# Patient Record
Sex: Female | Born: 1970
Health system: Southern US, Community
[De-identification: ages and names within clinical notes are randomized; demographics above are authoritative.]

## PROBLEM LIST (undated history)

## (undated) DIAGNOSIS — I10 Essential (primary) hypertension: Secondary | ICD-10-CM

## (undated) DIAGNOSIS — F32A Depression, unspecified: Secondary | ICD-10-CM

## (undated) DIAGNOSIS — K5909 Other constipation: Secondary | ICD-10-CM

## (undated) DIAGNOSIS — K649 Unspecified hemorrhoids: Secondary | ICD-10-CM

## (undated) DIAGNOSIS — D219 Benign neoplasm of connective and other soft tissue, unspecified: Secondary | ICD-10-CM

## (undated) DIAGNOSIS — D509 Iron deficiency anemia, unspecified: Secondary | ICD-10-CM

## (undated) DIAGNOSIS — E782 Mixed hyperlipidemia: Secondary | ICD-10-CM

## (undated) DIAGNOSIS — E669 Obesity, unspecified: Secondary | ICD-10-CM

## (undated) DIAGNOSIS — D259 Leiomyoma of uterus, unspecified: Secondary | ICD-10-CM

## (undated) DIAGNOSIS — R7303 Prediabetes: Secondary | ICD-10-CM

## (undated) DIAGNOSIS — M1990 Unspecified osteoarthritis, unspecified site: Secondary | ICD-10-CM

## (undated) DIAGNOSIS — D649 Anemia, unspecified: Secondary | ICD-10-CM

## (undated) HISTORY — PX: TUBAL LIGATION: SHX77

## (undated) HISTORY — DX: Depression, unspecified: F32.A

## (undated) HISTORY — DX: Anemia, unspecified: D64.9

## (undated) HISTORY — DX: Essential (primary) hypertension: I10

## (undated) HISTORY — DX: Benign neoplasm of connective and other soft tissue, unspecified: D21.9

## (undated) HISTORY — DX: Prediabetes: R73.03

---

## 1997-10-20 HISTORY — PX: TUBAL LIGATION: SHX77

## 1998-03-27 ENCOUNTER — Other Ambulatory Visit: Admission: RE | Admit: 1998-03-27 | Discharge: 1998-03-27 | Payer: Self-pay | Admitting: *Deleted

## 1998-03-30 ENCOUNTER — Ambulatory Visit (HOSPITAL_COMMUNITY): Admission: RE | Admit: 1998-03-30 | Discharge: 1998-03-30 | Payer: Self-pay | Admitting: *Deleted

## 1998-06-21 ENCOUNTER — Ambulatory Visit (HOSPITAL_COMMUNITY): Admission: RE | Admit: 1998-06-21 | Discharge: 1998-06-21 | Payer: Self-pay | Admitting: *Deleted

## 1998-08-23 ENCOUNTER — Inpatient Hospital Stay (HOSPITAL_COMMUNITY): Admission: AD | Admit: 1998-08-23 | Discharge: 1998-08-26 | Payer: Self-pay | Admitting: *Deleted

## 1999-03-23 ENCOUNTER — Emergency Department (HOSPITAL_COMMUNITY): Admission: EM | Admit: 1999-03-23 | Discharge: 1999-03-23 | Payer: Self-pay | Admitting: Emergency Medicine

## 1999-04-19 ENCOUNTER — Emergency Department (HOSPITAL_COMMUNITY): Admission: EM | Admit: 1999-04-19 | Discharge: 1999-04-19 | Payer: Self-pay | Admitting: Emergency Medicine

## 1999-11-20 ENCOUNTER — Encounter: Payer: Self-pay | Admitting: Emergency Medicine

## 1999-11-20 ENCOUNTER — Emergency Department (HOSPITAL_COMMUNITY): Admission: EM | Admit: 1999-11-20 | Discharge: 1999-11-21 | Payer: Self-pay | Admitting: Emergency Medicine

## 2000-04-12 ENCOUNTER — Emergency Department (HOSPITAL_COMMUNITY): Admission: EM | Admit: 2000-04-12 | Discharge: 2000-04-12 | Payer: Self-pay | Admitting: Emergency Medicine

## 2000-06-05 ENCOUNTER — Encounter: Payer: Self-pay | Admitting: Cardiology

## 2000-06-05 ENCOUNTER — Ambulatory Visit (HOSPITAL_COMMUNITY): Admission: RE | Admit: 2000-06-05 | Discharge: 2000-06-05 | Payer: Self-pay | Admitting: Cardiology

## 2000-09-26 ENCOUNTER — Emergency Department (HOSPITAL_COMMUNITY): Admission: EM | Admit: 2000-09-26 | Discharge: 2000-09-26 | Payer: Self-pay | Admitting: Internal Medicine

## 2000-12-07 ENCOUNTER — Emergency Department (HOSPITAL_COMMUNITY): Admission: EM | Admit: 2000-12-07 | Discharge: 2000-12-07 | Payer: Self-pay | Admitting: Emergency Medicine

## 2000-12-10 ENCOUNTER — Emergency Department (HOSPITAL_COMMUNITY): Admission: EM | Admit: 2000-12-10 | Discharge: 2000-12-10 | Payer: Self-pay | Admitting: Emergency Medicine

## 2001-01-19 ENCOUNTER — Encounter: Admission: RE | Admit: 2001-01-19 | Discharge: 2001-04-19 | Payer: Self-pay | Admitting: Internal Medicine

## 2001-03-10 ENCOUNTER — Other Ambulatory Visit: Admission: RE | Admit: 2001-03-10 | Discharge: 2001-03-10 | Payer: Self-pay | Admitting: Internal Medicine

## 2001-08-24 ENCOUNTER — Emergency Department (HOSPITAL_COMMUNITY): Admission: EM | Admit: 2001-08-24 | Discharge: 2001-08-24 | Payer: Self-pay | Admitting: Emergency Medicine

## 2001-08-24 ENCOUNTER — Encounter: Payer: Self-pay | Admitting: Emergency Medicine

## 2001-11-28 ENCOUNTER — Encounter: Payer: Self-pay | Admitting: Emergency Medicine

## 2001-11-28 ENCOUNTER — Emergency Department (HOSPITAL_COMMUNITY): Admission: EM | Admit: 2001-11-28 | Discharge: 2001-11-28 | Payer: Self-pay | Admitting: Emergency Medicine

## 2002-04-29 ENCOUNTER — Encounter: Payer: Self-pay | Admitting: Internal Medicine

## 2002-04-29 ENCOUNTER — Encounter: Admission: RE | Admit: 2002-04-29 | Discharge: 2002-04-29 | Payer: Self-pay | Admitting: Internal Medicine

## 2003-01-10 ENCOUNTER — Inpatient Hospital Stay (HOSPITAL_COMMUNITY): Admission: AD | Admit: 2003-01-10 | Discharge: 2003-01-10 | Payer: Self-pay | Admitting: *Deleted

## 2003-09-10 ENCOUNTER — Inpatient Hospital Stay (HOSPITAL_COMMUNITY): Admission: AD | Admit: 2003-09-10 | Discharge: 2003-09-10 | Payer: Self-pay | Admitting: Obstetrics and Gynecology

## 2003-10-05 ENCOUNTER — Encounter: Admission: RE | Admit: 2003-10-05 | Discharge: 2003-10-05 | Payer: Self-pay | Admitting: Obstetrics and Gynecology

## 2004-04-22 ENCOUNTER — Inpatient Hospital Stay (HOSPITAL_COMMUNITY): Admission: AD | Admit: 2004-04-22 | Discharge: 2004-04-22 | Payer: Self-pay | Admitting: Gynecology

## 2004-11-13 ENCOUNTER — Encounter: Admission: RE | Admit: 2004-11-13 | Discharge: 2004-11-13 | Payer: Self-pay | Admitting: Internal Medicine

## 2004-12-22 ENCOUNTER — Emergency Department (HOSPITAL_COMMUNITY): Admission: EM | Admit: 2004-12-22 | Discharge: 2004-12-22 | Payer: Self-pay | Admitting: Emergency Medicine

## 2005-09-21 ENCOUNTER — Emergency Department (HOSPITAL_COMMUNITY): Admission: EM | Admit: 2005-09-21 | Discharge: 2005-09-21 | Payer: Self-pay | Admitting: Emergency Medicine

## 2007-05-05 ENCOUNTER — Emergency Department (HOSPITAL_COMMUNITY): Admission: EM | Admit: 2007-05-05 | Discharge: 2007-05-05 | Payer: Self-pay | Admitting: Family Medicine

## 2008-04-18 ENCOUNTER — Inpatient Hospital Stay (HOSPITAL_COMMUNITY): Admission: AD | Admit: 2008-04-18 | Discharge: 2008-04-18 | Payer: Self-pay | Admitting: Obstetrics & Gynecology

## 2008-05-11 ENCOUNTER — Inpatient Hospital Stay (HOSPITAL_COMMUNITY): Admission: AD | Admit: 2008-05-11 | Discharge: 2008-05-11 | Payer: Self-pay | Admitting: Obstetrics & Gynecology

## 2008-05-25 ENCOUNTER — Emergency Department (HOSPITAL_COMMUNITY): Admission: EM | Admit: 2008-05-25 | Discharge: 2008-05-25 | Payer: Self-pay | Admitting: Emergency Medicine

## 2009-03-09 ENCOUNTER — Inpatient Hospital Stay (HOSPITAL_COMMUNITY): Admission: AD | Admit: 2009-03-09 | Discharge: 2009-03-10 | Payer: Self-pay | Admitting: Obstetrics and Gynecology

## 2009-03-09 ENCOUNTER — Ambulatory Visit: Payer: Self-pay | Admitting: Obstetrics and Gynecology

## 2009-07-18 ENCOUNTER — Emergency Department (HOSPITAL_COMMUNITY): Admission: EM | Admit: 2009-07-18 | Discharge: 2009-07-18 | Payer: Self-pay | Admitting: Family Medicine

## 2009-08-24 ENCOUNTER — Emergency Department (HOSPITAL_COMMUNITY): Admission: EM | Admit: 2009-08-24 | Discharge: 2009-08-24 | Payer: Self-pay | Admitting: Family Medicine

## 2009-12-11 ENCOUNTER — Encounter (INDEPENDENT_AMBULATORY_CARE_PROVIDER_SITE_OTHER): Payer: Self-pay | Admitting: Emergency Medicine

## 2009-12-11 ENCOUNTER — Inpatient Hospital Stay (HOSPITAL_COMMUNITY): Admission: EM | Admit: 2009-12-11 | Discharge: 2009-12-14 | Payer: Self-pay | Admitting: Emergency Medicine

## 2009-12-11 ENCOUNTER — Ambulatory Visit: Payer: Self-pay | Admitting: Vascular Surgery

## 2010-07-13 ENCOUNTER — Emergency Department (HOSPITAL_COMMUNITY): Admission: EM | Admit: 2010-07-13 | Discharge: 2010-07-13 | Payer: Self-pay | Admitting: Family Medicine

## 2011-01-02 LAB — POCT PREGNANCY, URINE: Preg Test, Ur: NEGATIVE

## 2011-01-02 LAB — POCT URINALYSIS DIPSTICK
Bilirubin Urine: NEGATIVE
Hgb urine dipstick: NEGATIVE
Nitrite: NEGATIVE
pH: 5.5 (ref 5.0–8.0)

## 2011-01-10 LAB — BASIC METABOLIC PANEL
BUN: 5 mg/dL — ABNORMAL LOW (ref 6–23)
CO2: 22 mEq/L (ref 19–32)
CO2: 25 mEq/L (ref 19–32)
Calcium: 8.9 mg/dL (ref 8.4–10.5)
Chloride: 105 mEq/L (ref 96–112)
Chloride: 105 mEq/L (ref 96–112)
Creatinine, Ser: 0.56 mg/dL (ref 0.4–1.2)
Creatinine, Ser: 0.63 mg/dL (ref 0.4–1.2)
GFR calc non Af Amer: >60 mL/min (ref >60)
GFR calc non Af Amer: >60 mL/min (ref >60)
Glucose, Bld: 110 mg/dL — ABNORMAL HIGH (ref 70–99)
Potassium: 3.6 mEq/L (ref 3.5–5.1)
Potassium: 3.6 mEq/L (ref 3.5–5.1)
Potassium: 3.7 mEq/L (ref 3.5–5.1)
Sodium: 134 mEq/L — ABNORMAL LOW (ref 135–145)

## 2011-01-10 LAB — CBC
Hemoglobin: 10.3 g/dL — ABNORMAL LOW (ref 12.0–15.0)
Hemoglobin: 11.8 g/dL — ABNORMAL LOW (ref 12.0–15.0)
MCHC: 31.3 g/dL (ref 30.0–36.0)
MCHC: 32.8 g/dL (ref 30.0–36.0)
MCV: 74.4 fL — ABNORMAL LOW (ref 78.0–100.0)
MCV: 75.4 fL — ABNORMAL LOW (ref 78.0–100.0)
MCV: 75.6 fL — ABNORMAL LOW (ref 78.0–100.0)
Platelets: 259 10*3/uL (ref 150–400)
Platelets: 351 10*3/uL (ref 150–400)
RBC: 4.14 MIL/uL (ref 3.87–5.11)
RBC: 4.94 MIL/uL (ref 3.87–5.11)
RDW: 18.6 % — ABNORMAL HIGH (ref 11.5–15.5)
RDW: 19.4 % — ABNORMAL HIGH (ref 11.5–15.5)
RDW: 19.6 % — ABNORMAL HIGH (ref 11.5–15.5)
WBC: 8.3 10*3/uL (ref 4.0–10.5)

## 2011-01-10 LAB — DIFFERENTIAL
Basophils Absolute: 0 10*3/uL (ref 0.0–0.1)
Basophils Absolute: 0.1 10*3/uL (ref 0.0–0.1)
Eosinophils Absolute: 0.2 10*3/uL (ref 0.0–0.7)
Eosinophils Relative: 3 % (ref 0–5)
Lymphocytes Relative: 12 % (ref 12–46)
Lymphocytes Relative: 23 % (ref 12–46)
Lymphs Abs: 1.3 10*3/uL (ref 0.7–4.0)
Lymphs Abs: 1.9 10*3/uL (ref 0.7–4.0)
Monocytes Absolute: 0.4 10*3/uL (ref 0.1–1.0)
Monocytes Absolute: 0.7 10*3/uL (ref 0.1–1.0)
Monocytes Absolute: 1.1 10*3/uL — ABNORMAL HIGH (ref 0.1–1.0)
Monocytes Relative: 5 % (ref 3–12)
Monocytes Relative: 6 % (ref 3–12)
Neutro Abs: 14.7 10*3/uL — ABNORMAL HIGH (ref 1.7–7.7)
Neutro Abs: 5.7 10*3/uL (ref 1.7–7.7)
Neutro Abs: 8.9 10*3/uL — ABNORMAL HIGH (ref 1.7–7.7)
Neutrophils Relative %: 78 % — ABNORMAL HIGH (ref 43–77)
Neutrophils Relative %: 83 % — ABNORMAL HIGH (ref 43–77)

## 2011-01-28 LAB — WET PREP, GENITAL: Trich, Wet Prep: NONE SEEN

## 2011-01-28 LAB — GC/CHLAMYDIA PROBE AMP, GENITAL: GC Probe Amp, Genital: NEGATIVE

## 2011-03-07 NOTE — Group Therapy Note (Signed)
NAME:  Latoya Juarez, IVINS NO.:  [id number]   MEDICAL RECORD NO.:  [id number]                   PATIENT TYPE:  OUT   LOCATION:  [facility]                           FACILITY:  [facility]   PHYSICIAN:  Argentina Donovan, MD                     DATE OF BIRTH:  1971/05/02   DATE OF SERVICE:  10/05/2003                                    CLINIC NOTE   REASON FOR VISIT:  The patient is a 40 year old black female who is a  gravida 5, para 5-0-0-5 with five C-sections who was seen in the [facility] because  of Bartholin abscess one week ago.  A Word catheter was put in and drained  and which fell out three days later, and the patient is in for a recheck.  In addition, she has not had a Pap smear in over a year and has no other  complaints.  We discussed the Bartholin cyst and the mechanism of action  with the patient and told her when it gets as large as a plum to come in and  we will put the catheter back in which we will expect her to leave in for  three weeks.   PHYSICAL EXAMINATION:  PELVIC:  External genitalia is normal exception of a  3 cm Bartholin cyst on the right side which is nontender.  Introitus is  normal.  BUS within normal limits.  Vagina is clean and well rugated.  The  cervix is clean and nulliparous.  The uterus anterior, normal size, shape,  consistency, and the adnexa could not be palpated because of the habitus of  the patient 230 pounds and 5 feet 1 inch.   IMPRESSION:  Right Bartholin cyst.  Pap smear was done.                                               Argentina Donovan, MD    PR/MEDQ  D:  10/05/2003  T:  10/06/2003  Job:  [id number]

## 2011-07-17 LAB — URINALYSIS, ROUTINE W REFLEX MICROSCOPIC
Glucose, UA: 100 — AB
Hgb urine dipstick: NEGATIVE
Ketones, ur: 15 — AB
Protein, ur: NEGATIVE
pH: 6

## 2011-07-17 LAB — GC/CHLAMYDIA PROBE AMP, GENITAL
Chlamydia, DNA Probe: NEGATIVE
GC Probe Amp, Genital: NEGATIVE

## 2011-07-17 LAB — WET PREP, GENITAL

## 2011-07-18 LAB — URINE MICROSCOPIC-ADD ON

## 2011-07-18 LAB — URINALYSIS, ROUTINE W REFLEX MICROSCOPIC
Leukocytes, UA: NEGATIVE
Specific Gravity, Urine: >1.03 — ABNORMAL HIGH
pH: 6

## 2011-07-18 LAB — POCT PREGNANCY, URINE
Operator id: 202651
Preg Test, Ur: NEGATIVE

## 2011-10-04 ENCOUNTER — Encounter: Payer: Self-pay | Admitting: Emergency Medicine

## 2011-10-04 ENCOUNTER — Emergency Department (HOSPITAL_COMMUNITY): Payer: Worker's Compensation

## 2011-10-04 ENCOUNTER — Emergency Department (HOSPITAL_COMMUNITY)
Admission: EM | Admit: 2011-10-04 | Discharge: 2011-10-04 | Disposition: A | Discharge status: Home / Self Care | Payer: Worker's Compensation | Attending: Emergency Medicine | Admitting: Emergency Medicine

## 2011-10-04 DIAGNOSIS — R404 Transient alteration of awareness: Secondary | ICD-10-CM | POA: Insufficient documentation

## 2011-10-04 DIAGNOSIS — W010XXA Fall on same level from slipping, tripping and stumbling without subsequent striking against object, initial encounter: Secondary | ICD-10-CM | POA: Insufficient documentation

## 2011-10-04 DIAGNOSIS — M545 Low back pain, unspecified: Secondary | ICD-10-CM | POA: Insufficient documentation

## 2011-10-04 DIAGNOSIS — G8929 Other chronic pain: Secondary | ICD-10-CM | POA: Insufficient documentation

## 2011-10-04 DIAGNOSIS — M25569 Pain in unspecified knee: Secondary | ICD-10-CM | POA: Insufficient documentation

## 2011-10-04 DIAGNOSIS — M549 Dorsalgia, unspecified: Secondary | ICD-10-CM

## 2011-10-04 DIAGNOSIS — W19XXXA Unspecified fall, initial encounter: Secondary | ICD-10-CM

## 2011-10-04 HISTORY — DX: Obesity, unspecified: E66.9

## 2011-10-04 MED ORDER — OXYCODONE-ACETAMINOPHEN 5-325 MG PO TABS: 2.0000 | ORAL_TABLET | ORAL | Status: AC | PRN

## 2011-10-04 MED ORDER — IBUPROFEN 800 MG PO TABS
800.0000 mg | ORAL_TABLET | Freq: Once | ORAL | Status: AC
  Administered 2011-10-04: 800 mg via ORAL
  Filled 2011-10-04: qty 1

## 2011-10-04 MED ORDER — OXYCODONE-ACETAMINOPHEN 5-325 MG PO TABS
1.0000 | ORAL_TABLET | Freq: Once | ORAL | Status: AC
  Administered 2011-10-04: 1 via ORAL
  Filled 2011-10-04: qty 1

## 2011-10-04 NOTE — ED Provider Notes (Signed)
History     CSN: [id number] Arrival date & time: 10/04/2011  8:52 PM   First MD Initiated Contact with Patient 10/04/11 2154      Chief Complaint  Patient presents with  . Fall    (Consider location/radiation/quality/duration/timing/severity/associated sxs/prior treatment) Patient is a 40 y.o. female presenting with fall. The history is provided by the patient. No language interpreter was used.  Fall The accident occurred 6 to 12 hours ago. The fall occurred while walking. She landed on a hard floor. There was no blood loss. The pain is at a severity of 5/10. The pain is mild. She was ambulatory at the scene. There was no entrapment after the fall. There was no drug use involved in the accident. There was no alcohol use involved in the accident. Associated symptoms include loss of consciousness. Pertinent negatives include no visual change, no fever, no numbness, no bowel incontinence, no vomiting, no headaches and no tingling. The symptoms are aggravated by activity, standing and pressure on the injury. She has tried ice for the symptoms. The treatment provided mild relief.   [facility] where she fell forward and slipped on the floor and landed on her right knee. She's having right knee pain and lower back pain. Past medical history of chronic lower back pain. No deformity or swelling noted. No numbness or tingling or shooting pain. Patient is in a wheelchair presently and states that she would like to have crutches when she leaves. Past Medical History  Diagnosis Date  . Obesity     Past Surgical History  Procedure Date  . Cesarean section     No family history on file.  History  Substance Use Topics  . Smoking status: Never Smoker   . Smokeless tobacco: Not on file  . Alcohol Use: No    OB History    Grav Para Term Preterm Abortions TAB SAB Ect Mult Living                  Review of Systems  Constitutional: Negative for fever.  Gastrointestinal: Negative  for vomiting and bowel incontinence.  Neurological: Positive for loss of consciousness. Negative for tingling, numbness and headaches.  All other systems reviewed and are negative.    Allergies  Review of patient's allergies indicates no known allergies.  Home Medications   Current Outpatient Rx  Name Route Sig Dispense Refill  . ACETAMINOPHEN 500 MG PO TABS Oral Take 1,000 mg by mouth every 6 (six) hours as needed. For pain     . CYCLOBENZAPRINE HCL 10 MG PO TABS Oral Take 10 mg by mouth 3 (three) times daily as needed. For muscle spasms     . PRESCRIPTION MEDICATION Oral Take 1 tablet by mouth daily as needed. For back pain       BP 120/84  Pulse 84  Temp(Src) 98.4 F (36.9 C) (Oral)  Resp 16  SpO2 99%  LMP 09/04/2011  Physical Exam  Constitutional: She is oriented to person, place, and time.  Eyes: Pupils are equal, round, and reactive to light.  Neck: Normal range of motion.  Pulmonary/Chest: Effort normal.  Musculoskeletal: She exhibits tenderness. She exhibits no edema.       R knee and lower back pain point tenderness in lumbar spine  Neurological: She is alert and oriented to person, place, and time.  Skin: Skin is warm and dry.  Psychiatric: She has a normal mood and affect.    ED Course  Procedures (including critical care  time)  Labs Reviewed - No data to display Dg Knee Complete 4 Views Right  10/04/2011  *RADIOLOGY REPORT*  Clinical Data: Fall, pain  RIGHT KNEE - COMPLETE 4+ VIEW  Comparison:  None.  Findings:  There is no evidence of fracture, dislocation, or joint effusion.  There is no evidence of arthropathy or other focal bone abnormality.  Soft tissues are unremarkable.  IMPRESSION: Negative.  Original Report Authenticated By: Camelia Phenes, M.D.     No diagnosis found.    MDM  Right knee pain and lower back pain after a fall at [employer] where she works. No deformity or swelling noted. Point tenderness in lumbar spine. X-rays negative for  fracture. Patient was provided with crutches and pain medicine. She was also instructed to use ice on the injury for followup position or her PCP if not better by Monday.        Jethro Bastos, NP 10/05/11 [phone]

## 2011-10-04 NOTE — Progress Notes (Signed)
Orthopedic Tech Progress Note Patient Details:  Latoya Juarez 10-05-1971 [id number]  Other Ortho Devices Type of Ortho Device: Crutches Ortho Device Location: (R) LE Ortho Device Interventions: Application   [name] 10/04/2011, 10:54 PM

## 2011-10-04 NOTE — ED Notes (Signed)
PT. SLIPPED ON WET FLOOR THIS EVENING WHILE AT WORK [facility] ) , NO LOC , REPORTS PAIN AT RIGHT KNEE / LOWER BACK .

## 2011-10-04 NOTE — ED Notes (Addendum)
Pt reports falling at her job tonight after tripping on a puddle of water.  States that she landed directly on her (R) knee.  Pt is noted to have ice on her knee.  No brusing, swelling or deformity noted.  Pt in wheelchair.  No distress noted.  Skin warm, dry and intact.  Neuro intact.

## 2011-10-05 NOTE — ED Provider Notes (Signed)
Medical screening examination/treatment/procedure(s) were performed by non-physician practitioner and as supervising physician I was immediately available for consultation/collaboration.  Yuri Flener, MD 10/05/11 1615 

## 2014-02-16 ENCOUNTER — Emergency Department (HOSPITAL_COMMUNITY)
Admission: EM | Admit: 2014-02-16 | Discharge: 2014-02-16 | Disposition: A | Discharge status: Home / Self Care | Payer: Worker's Compensation | Attending: Emergency Medicine | Admitting: Emergency Medicine

## 2014-02-16 ENCOUNTER — Emergency Department (HOSPITAL_COMMUNITY): Payer: Worker's Compensation

## 2014-02-16 ENCOUNTER — Encounter (HOSPITAL_COMMUNITY): Payer: Self-pay | Admitting: Emergency Medicine

## 2014-02-16 DIAGNOSIS — K219 Gastro-esophageal reflux disease without esophagitis: Secondary | ICD-10-CM | POA: Insufficient documentation

## 2014-02-16 DIAGNOSIS — E669 Obesity, unspecified: Secondary | ICD-10-CM | POA: Insufficient documentation

## 2014-02-16 DIAGNOSIS — F17200 Nicotine dependence, unspecified, uncomplicated: Secondary | ICD-10-CM | POA: Insufficient documentation

## 2014-02-16 DIAGNOSIS — IMO0001 Reserved for inherently not codable concepts without codable children: Secondary | ICD-10-CM

## 2014-02-16 DIAGNOSIS — R079 Chest pain, unspecified: Secondary | ICD-10-CM

## 2014-02-16 DIAGNOSIS — F411 Generalized anxiety disorder: Secondary | ICD-10-CM | POA: Insufficient documentation

## 2014-02-16 LAB — CBC
HCT: 31.6 % — ABNORMAL LOW (ref 36.0–46.0)
Hemoglobin: 9.6 g/dL — ABNORMAL LOW (ref 12.0–15.0)
MCH: 21.1 pg — AB (ref 26.0–34.0)
MCHC: 30.4 g/dL (ref 30.0–36.0)
MCV: 69.6 fL — ABNORMAL LOW (ref 78.0–100.0)
Platelets: 344 10*3/uL (ref 150–400)
RBC: 4.54 MIL/uL (ref 3.87–5.11)
RDW: 16.4 % — AB (ref 11.5–15.5)
WBC: 8 10*3/uL (ref 4.0–10.5)

## 2014-02-16 LAB — BASIC METABOLIC PANEL
BUN: 8 mg/dL (ref 6–23)
CALCIUM: 9.3 mg/dL (ref 8.4–10.5)
CO2: 20 mEq/L (ref 19–32)
Chloride: 102 mEq/L (ref 96–112)
Creatinine, Ser: 0.63 mg/dL (ref 0.50–1.10)
GLUCOSE: 106 mg/dL — AB (ref 70–99)
POTASSIUM: 4.1 meq/L (ref 3.7–5.3)
SODIUM: 136 meq/L — AB (ref 137–147)

## 2014-02-16 LAB — PRO B NATRIURETIC PEPTIDE: Pro B Natriuretic peptide (BNP): <5 pg/mL (ref 0–125)

## 2014-02-16 LAB — I-STAT TROPONIN, ED: TROPONIN I, POC: 0 ng/mL (ref 0.00–0.08)

## 2014-02-16 MED ORDER — FAMOTIDINE 20 MG PO TABS: 20.0000 mg | ORAL_TABLET | Freq: Two times a day (BID) | ORAL | Status: DC

## 2014-02-16 NOTE — ED Provider Notes (Signed)
CSN: [id number]     Arrival date & time 02/16/14  1012 History   First MD Initiated Contact with Patient 02/16/14 1111     Chief Complaint  Patient presents with  . Chest Pain  . Palpitations     (Consider location/radiation/quality/duration/timing/severity/associated sxs/prior Treatment) HPI Comments: Patient is a 43 year old obese female who presents to the emergency department complaining of intermittent chest pain and palpitations x1 week. Nothing in specific makes her symptoms come on, states the episodes all last different amounts of times. Symptoms relieved by belching. Occasionally she feels like it is difficult to catch her breath. This morning when she woke up she had slight tingling sensation in her left fingers which has since subsided. Denies weakness. States she may be under increased stress from finding out her daughter is pregnant. Currently she is asymptomatic. Denies cough, fever, chills, nausea, vomiting, diaphoresis or headaches. She is a smoker, states her mom has congestive heart failure.  The history is provided by the patient.    Past Medical History  Diagnosis Date  . Obesity    Past Surgical History  Procedure Laterality Date  . Cesarean section     History reviewed. No pertinent family history. History  Substance Use Topics  . Smoking status: Never Smoker   . Smokeless tobacco: Not on file  . Alcohol Use: No   OB History   Grav Para Term Preterm Abortions TAB SAB Ect Mult Living                 Review of Systems  Cardiovascular: Positive for chest pain.  Neurological:       Positive for tingling.  Psychiatric/Behavioral: The patient is nervous/anxious.   All other systems reviewed and are negative.     Allergies  Review of patient's allergies indicates no known allergies.  Home Medications   Prior to Admission medications   Medication Sig Start Date End Date Taking? Authorizing Provider  acetaminophen (TYLENOL) 500 MG tablet Take 1,000  mg by mouth every 6 (six) hours as needed. For pain     Historical Provider, MD  cyclobenzaprine (FLEXERIL) 10 MG tablet Take 10 mg by mouth 3 (three) times daily as needed. For muscle spasms     Historical Provider, MD  PRESCRIPTION MEDICATION Take 1 tablet by mouth daily as needed. For back pain     Historical Provider, MD   BP 127/72  Pulse 71  Temp(Src) 98.2 F (36.8 C) (Oral)  Resp 23  SpO2 100%  LMP 01/24/2014 Physical Exam  Nursing note and vitals reviewed. Constitutional: She is oriented to person, place, and time. She appears well-developed and well-nourished. No distress.  Obese.  HENT:  Head: Normocephalic and atraumatic.  Mouth/Throat: Oropharynx is clear and moist.  Eyes: Conjunctivae and EOM are normal. Pupils are equal, round, and reactive to light.  Neck: Normal range of motion. Neck supple. No JVD present.  Cardiovascular: Normal rate, regular rhythm, normal heart sounds and intact distal pulses.   No extremity edema.  Pulmonary/Chest: Effort normal and breath sounds normal. No respiratory distress. She exhibits no tenderness.  Abdominal: Soft. Bowel sounds are normal. There is no tenderness.  Musculoskeletal: Normal range of motion. She exhibits no edema.  Neurological: She is alert and oriented to person, place, and time. She has normal strength. No sensory deficit.  Speech fluent, goal oriented. Moves limbs without ataxia. Equal grip strength bilateral.  Skin: Skin is warm and dry. She is not diaphoretic.  Psychiatric: She has a normal mood  and affect. Her behavior is normal.    ED Course  Procedures (including critical care time) Labs Review Labs Reviewed  CBC - Abnormal; Notable for the following:    Hemoglobin 9.6 (*)    HCT 31.6 (*)    MCV 69.6 (*)    MCH 21.1 (*)    RDW 16.4 (*)    All other components within normal limits  BASIC METABOLIC PANEL - Abnormal; Notable for the following:    Sodium 136 (*)    Glucose, Bld 106 (*)    All other  components within normal limits  PRO B NATRIURETIC PEPTIDE  I-STAT TROPOININ, ED    Imaging Review Dg Chest 2 View  02/16/2014   CLINICAL DATA:  Chest pain, palpitations  EXAM: CHEST  2 VIEW  COMPARISON:  None.  FINDINGS: The lungs are clear and negative for focal airspace consolidation, pulmonary edema or suspicious pulmonary nodule. No pleural effusion or pneumothorax. Cardiac and mediastinal contours are within normal limits. No acute fracture or lytic or blastic osseous lesions. The visualized upper abdominal bowel gas pattern is unremarkable.  IMPRESSION: No active cardiopulmonary disease.   Electronically Signed   By: Jacqulynn Cadet M.D.   On: 02/16/2014 11:42     EKG Interpretation   Date/Time:  Thursday February 16 2014 11:20:57 EDT Ventricular Rate:  73 PR Interval:  145 QRS Duration: 81 QT Interval:  388 QTC Calculation: 427 R Axis:   57 Text Interpretation:  Sinus rhythm Consider right atrial enlargement No  significant change since last tracing Confirmed by [facility]  MD, Juanda Crumble  [phone]) on 02/16/2014 11:25:25 AM      MDM   Final diagnoses:  Chest pain  Reflux   Patient presenting with intermittent chest pain and palpitations. She is well appearing and in no apparent distress, vital signs stable. EKG normal. Labs, chest x-ray normal. Hemoglobin at baseline. Troponin negative. Doubt cardiac, low risk HEART score 2. Doubt PE, PERC negative. Symptoms most likely from stress/anxiety and reflux. Will prescribe pepcid. Resources for PCP followup given. Stable for discharge. Return precautions given. Patient states understanding of treatment care plan and is agreeable.    Illene Labrador, PA-C 02/16/14 1214

## 2014-02-16 NOTE — ED Provider Notes (Signed)
Medical screening examination/treatment/procedure(s) were performed by non-physician practitioner and as supervising physician I was immediately available for consultation/collaboration.   EKG Interpretation   Date/Time:  Thursday February 16 2014 11:20:57 EDT Ventricular Rate:  73 PR Interval:  145 QRS Duration: 81 QT Interval:  388 QTC Calculation: 427 R Axis:   57 Text Interpretation:  Sinus rhythm Consider right atrial enlargement No  significant change since last tracing Confirmed by [facility]  MD, Safire Gordin  [phone]) on 02/16/2014 11:25:25 AM        Mercie Eon. Karle Starch, MD 02/16/14 1215

## 2014-02-16 NOTE — Discharge Instructions (Signed)
Take pepcid as prescribed for your symptoms. Follow up with the wellness clinic to establish care with a primary care doctor.  Chest Pain (Nonspecific) It is often hard to give a specific diagnosis for the cause of chest pain. There is always a chance that your pain could be related to something serious, such as a heart attack or a blood clot in the lungs. You need to follow up with your caregiver for further evaluation. CAUSES   Heartburn.  Pneumonia or bronchitis.  Anxiety or stress.  Inflammation around your heart (pericarditis) or lung (pleuritis or pleurisy).  A blood clot in the lung.  A collapsed lung (pneumothorax). It can develop suddenly on its own (spontaneous pneumothorax) or from injury (trauma) to the chest.  Shingles infection (herpes zoster virus). The chest wall is composed of bones, muscles, and cartilage. Any of these can be the source of the pain.  The bones can be bruised by injury.  The muscles or cartilage can be strained by coughing or overwork.  The cartilage can be affected by inflammation and become sore (costochondritis). DIAGNOSIS  Lab tests or other studies, such as X-rays, electrocardiography, stress testing, or cardiac imaging, may be needed to find the cause of your pain.  TREATMENT   Treatment depends on what may be causing your chest pain. Treatment may include:  Acid blockers for heartburn.  Anti-inflammatory medicine.  Pain medicine for inflammatory conditions.  Antibiotics if an infection is present.  You may be advised to change lifestyle habits. This includes stopping smoking and avoiding alcohol, caffeine, and chocolate.  You may be advised to keep your head raised (elevated) when sleeping. This reduces the chance of acid going backward from your stomach into your esophagus.  Most of the time, nonspecific chest pain will improve within 2 to 3 days with rest and mild pain medicine. HOME CARE INSTRUCTIONS   If antibiotics were  prescribed, take your antibiotics as directed. Finish them even if you start to feel better.  For the next few days, avoid physical activities that bring on chest pain. Continue physical activities as directed.  Do not smoke.  Avoid drinking alcohol.  Only take over-the-counter or prescription medicine for pain, discomfort, or fever as directed by your caregiver.  Follow your caregiver's suggestions for further testing if your chest pain does not go away.  Keep any follow-up appointments you made. If you do not go to an appointment, you could develop lasting (chronic) problems with pain. If there is any problem keeping an appointment, you must call to reschedule. SEEK MEDICAL CARE IF:   You think you are having problems from the medicine you are taking. Read your medicine instructions carefully.  Your chest pain does not go away, even after treatment.  You develop a rash with blisters on your chest. SEEK IMMEDIATE MEDICAL CARE IF:   You have increased chest pain or pain that spreads to your arm, neck, jaw, back, or abdomen.  You develop shortness of breath, an increasing cough, or you are coughing up blood.  You have severe back or abdominal pain, feel nauseous, or vomit.  You develop severe weakness, fainting, or chills.  You have a fever. THIS IS AN EMERGENCY. Do not wait to see if the pain will go away. Get medical help at once. Call your local emergency services (911 in [place] Do not drive yourself to the hospital. MAKE SURE YOU:   Understand these instructions.  Will watch your condition.  Will get help right away if  you are not doing well or get worse. Document Released: 07/16/2005 Document Revised: 12/29/2011 Document Reviewed: 05/11/2008 [facility] Patient Information 2014 [place].  Gastroesophageal Reflux Disease, Adult Gastroesophageal reflux disease (GERD) happens when acid from your stomach flows up into the esophagus. When acid comes in contact with the  esophagus, the acid causes soreness (inflammation) in the esophagus. Over time, GERD may create small holes (ulcers) in the lining of the esophagus. CAUSES   Increased body weight. This puts pressure on the stomach, making acid rise from the stomach into the esophagus.  Smoking. This increases acid production in the stomach.  Drinking alcohol. This causes decreased pressure in the lower esophageal sphincter (valve or ring of muscle between the esophagus and stomach), allowing acid from the stomach into the esophagus.  Late evening meals and a full stomach. This increases pressure and acid production in the stomach.  A malformed lower esophageal sphincter. Sometimes, no cause is found. SYMPTOMS   Burning pain in the lower part of the mid-chest behind the breastbone and in the mid-stomach area. This may occur twice a week or more often.  Trouble swallowing.  Sore throat.  Dry cough.  Asthma-like symptoms including chest tightness, shortness of breath, or wheezing. DIAGNOSIS  Your caregiver may be able to diagnose GERD based on your symptoms. In some cases, X-rays and other tests may be done to check for complications or to check the condition of your stomach and esophagus. TREATMENT  Your caregiver may recommend over-the-counter or prescription medicines to help decrease acid production. Ask your caregiver before starting or adding any new medicines.  HOME CARE INSTRUCTIONS   Change the factors that you can control. Ask your caregiver for guidance concerning weight loss, quitting smoking, and alcohol consumption.  Avoid foods and drinks that make your symptoms worse, such as:  Caffeine or alcoholic drinks.  Chocolate.  Peppermint or mint flavorings.  Garlic and onions.  Spicy foods.  Citrus fruits, such as oranges, lemons, or limes.  Tomato-based foods such as sauce, chili, salsa, and pizza.  Fried and fatty foods.  Avoid lying down for the 3 hours prior to your  bedtime or prior to taking a nap.  Eat small, frequent meals instead of large meals.  Wear loose-fitting clothing. Do not wear anything tight around your waist that causes pressure on your stomach.  Raise the head of your bed 6 to 8 inches with wood blocks to help you sleep. Extra pillows will not help.  Only take over-the-counter or prescription medicines for pain, discomfort, or fever as directed by your caregiver.  Do not take aspirin, ibuprofen, or other nonsteroidal anti-inflammatory drugs (NSAIDs). SEEK IMMEDIATE MEDICAL CARE IF:   You have pain in your arms, neck, jaw, teeth, or back.  Your pain increases or changes in intensity or duration.  You develop nausea, vomiting, or sweating (diaphoresis).  You develop shortness of breath, or you faint.  Your vomit is green, yellow, black, or looks like coffee grounds or blood.  Your stool is red, bloody, or black. These symptoms could be signs of other problems, such as heart disease, gastric bleeding, or esophageal bleeding. MAKE SURE YOU:   Understand these instructions.  Will watch your condition.  Will get help right away if you are not doing well or get worse. Document Released: 07/16/2005 Document Revised: 12/29/2011 Document Reviewed: 04/25/2011 [facility] Patient Information 2014 [place], [place].

## 2014-02-16 NOTE — ED Notes (Signed)
Pt reports having palpitations and chest pains for extended amount of time, feels like its difficult to catch her breath. Today having numbness sensation to left arm. No resp distress noted at triage, no neuro deficits noted.

## 2014-04-25 ENCOUNTER — Emergency Department (HOSPITAL_COMMUNITY)
Admission: EM | Admit: 2014-04-25 | Discharge: 2014-04-25 | Disposition: A | Discharge status: Home / Self Care | Payer: Worker's Compensation | Attending: Emergency Medicine | Admitting: Emergency Medicine

## 2014-04-25 ENCOUNTER — Encounter (HOSPITAL_COMMUNITY): Payer: Self-pay | Admitting: Emergency Medicine

## 2014-04-25 DIAGNOSIS — F17200 Nicotine dependence, unspecified, uncomplicated: Secondary | ICD-10-CM | POA: Insufficient documentation

## 2014-04-25 DIAGNOSIS — E669 Obesity, unspecified: Secondary | ICD-10-CM | POA: Insufficient documentation

## 2014-04-25 DIAGNOSIS — T161XXA Foreign body in right ear, initial encounter: Secondary | ICD-10-CM

## 2014-04-25 DIAGNOSIS — Y9389 Activity, other specified: Secondary | ICD-10-CM | POA: Insufficient documentation

## 2014-04-25 DIAGNOSIS — Y9289 Other specified places as the place of occurrence of the external cause: Secondary | ICD-10-CM | POA: Insufficient documentation

## 2014-04-25 DIAGNOSIS — IMO0002 Reserved for concepts with insufficient information to code with codable children: Secondary | ICD-10-CM | POA: Insufficient documentation

## 2014-04-25 DIAGNOSIS — T169XXA Foreign body in ear, unspecified ear, initial encounter: Secondary | ICD-10-CM | POA: Insufficient documentation

## 2014-04-25 NOTE — ED Provider Notes (Signed)
CSN: [id number]     Arrival date & time 04/25/14  1018 History  This chart was scribed for non-physician practitioner, Noland Fordyce, PA-C, working with Veryl Speak, MD by Ladene Artist, ED Scribe. This patient was seen in room TR07C/TR07C and the patient's care was started at 10:28 AM.   Chief Complaint  Patient presents with  . Foreign Body in [place]   The history is provided by the patient. No language interpreter was used.   HPI Comments: Latoya Juarez is a 43 y.o. female who presents to the Emergency Department complaining of foreign body in R ear for 1.5 weeks. Pt suspects that she has a Q-tip swab in her ear. She reports associated itching and irritation to the ear. Pt denies ear pain, drainage, hearing loss. She has not tried to remove the foreign body as she believed it would fall out on its own.  Past Medical History  Diagnosis Date  . Obesity    Past Surgical History  Procedure Laterality Date  . Cesarean section     No family history on file. History  Substance Use Topics  . Smoking status: Current Every Day Smoker  . Smokeless tobacco: Not on file  . Alcohol Use: Yes   OB History   Grav Para Term Preterm Abortions TAB SAB Ect Mult Living                 Review of Systems  HENT: Negative for ear discharge, ear pain and hearing loss.        Foreign body in ear  All other systems reviewed and are negative.  Allergies  Review of patient's allergies indicates no known allergies.  Home Medications   Prior to Admission medications   Not on File   Triage Vitals: BP 138/75  Pulse 76  Temp(Src) 98.1 F (36.7 C) (Oral)  Resp 16  Ht 5\' 1"  (1.549 m)  Wt 236 lb (107.049 kg)  BMI 44.61 kg/m2  SpO2 100%  LMP 04/17/2014 Physical Exam  Nursing note and vitals reviewed. Constitutional: She is oriented to person, place, and time. She appears well-developed and well-nourished.  HENT:  Head: Normocephalic and atraumatic.  Right Ear: Tympanic membrane normal. No  drainage. A foreign body is present. Tympanic membrane is not erythematous.  Left Ear: Tympanic membrane normal.  R Ear:  Small piece of cotton along anterior wall of ear canal, touching the TM No occlusion No bleeding  Eyes: EOM are normal.  Neck: Normal range of motion.  Cardiovascular: Normal rate.   Pulmonary/Chest: Effort normal.  Musculoskeletal: Normal range of motion.  Neurological: She is alert and oriented to person, place, and time.  Skin: Skin is warm and dry.  Psychiatric: She has a normal mood and affect. Her behavior is normal.   ED Course  FOREIGN BODY REMOVAL Date/Time: 04/25/2014 11:24 AM Performed by: Noland Fordyce Authorized by: Noland Fordyce Consent: Verbal consent obtained. Risks and benefits: risks, benefits and alternatives were discussed Consent given by: patient Patient understanding: patient states understanding of the procedure being performed Patient consent: the patient's understanding of the procedure matches consent given Procedure consent: procedure consent matches procedure scheduled Relevant documents: relevant documents present and verified Site marked: the operative site was marked Required items: required blood products, implants, devices, and special equipment available Patient identity confirmed: verbally with patient Time out: Immediately prior to procedure a "time out" was called to verify the correct patient, procedure, equipment, support staff and site/side marked as required. Body area: ear Location  details: right ear Patient sedated: no Patient restrained: no Patient cooperative: yes Localization method: ENT speculum and visualized Removal mechanism: curette, forceps and irrigation Complexity: simple 0 objects recovered. Post-procedure assessment: foreign body not removed (cotton) Patient tolerance: Patient tolerated the procedure well with no immediate complications.      DIAGNOSTIC STUDIES: Oxygen Saturation is 100% on RA,  normal by my interpretation.    COORDINATION OF CARE: 10:30 AM-Discussed treatment plan with pt at bedside and pt agreed to plan.   Labs Review Labs Reviewed - No data to display  Imaging Review No results found.   EKG Interpretation None      MDM   Final diagnoses:  Foreign body in ear, right, initial encounter    Pt c/o cotton from Q-tip being stuck in right ear x1.5 weeks.  Denies pain or discharge. Just reports irritation.  On exam: TM in tact, no erythema. No discharge or bleeding. Cotton visualized in ear canal. Foreign body removal of cotton attempted with cerumen removal currette, ear flush and alligator forceps w/o success.  Referred pt to Dr. Constance Holster, ENT for further evaluation and tx of cotton in right ear. No evidence of infection. No antibiotics needed at this time.   I personally performed the services described in this documentation, which was scribed in my presence. The recorded information has been reviewed and is accurate.    Noland Fordyce, PA-C 04/25/14 1126

## 2014-04-25 NOTE — ED Notes (Addendum)
Pt reports that she has a Q-tip swab in her right ear ongoing for 1 1/2 weeks. Pt denies pain or drainage.

## 2014-04-25 NOTE — ED Provider Notes (Signed)
Medical screening examination/treatment/procedure(s) were performed by non-physician practitioner and as supervising physician I was immediately available for consultation/collaboration.     Veryl Speak, MD 04/25/14 [phone]

## 2014-04-25 NOTE — Discharge Instructions (Signed)
Ear flush:  You may attempt ear flushes at home to help try to get remaining cotton from right ear.  Mix 50% warm water and 50% peroxide.  Apply gentle pressure. If you feel pain or dizziness, stop using the flush.    Be sure to call to schedule a follow up appointment with Dr. Constance Holster, ENT (ear nose and throat) for further treatment of foreign body in ear.    Ear Foreign Body An ear foreign body is an object that is stuck in the ear. It is common for young children to put objects into the ear canal. These may include pebbles, beads, beans, and any other small objects which will fit. In adults, objects such as cotton swabs may become lodged in the ear canal. In all ages, the most common foreign bodies are insects that enter the ear canal.  SYMPTOMS  Foreign bodies may cause pain, buzzing or roaring sounds, hearing loss, and ear drainage.  HOME CARE INSTRUCTIONS   Keep all follow-up appointments with your caregiver as told.  Keep small objects out of reach of young children. Tell them not to put anything in their ears. SEEK IMMEDIATE MEDICAL CARE IF:   You have bleeding from the ear.  You have increased pain or swelling of the ear.  You have reduced hearing.  You have discharge coming from the ear.  You have a fever.  You have a headache. MAKE SURE YOU:   Understand these instructions.  Will watch your condition.  Will get help right away if you are not doing well or get worse. Document Released: 10/03/2000 Document Revised: 12/29/2011 Document Reviewed: 05/24/2008 [facility] Patient Information 2015 [place], [place]. This information is not intended to replace advice given to you by your health care provider. Make sure you discuss any questions you have with your health care provider.

## 2014-06-11 ENCOUNTER — Encounter (HOSPITAL_COMMUNITY): Payer: Self-pay | Admitting: Emergency Medicine

## 2014-06-11 ENCOUNTER — Emergency Department (HOSPITAL_COMMUNITY)
Admission: EM | Admit: 2014-06-11 | Discharge: 2014-06-11 | Disposition: A | Discharge status: Home / Self Care | Payer: Worker's Compensation | Attending: Emergency Medicine | Admitting: Emergency Medicine

## 2014-06-11 DIAGNOSIS — R202 Paresthesia of skin: Secondary | ICD-10-CM

## 2014-06-11 DIAGNOSIS — E669 Obesity, unspecified: Secondary | ICD-10-CM | POA: Insufficient documentation

## 2014-06-11 DIAGNOSIS — R209 Unspecified disturbances of skin sensation: Secondary | ICD-10-CM | POA: Insufficient documentation

## 2014-06-11 DIAGNOSIS — F17200 Nicotine dependence, unspecified, uncomplicated: Secondary | ICD-10-CM | POA: Insufficient documentation

## 2014-06-11 DIAGNOSIS — D649 Anemia, unspecified: Secondary | ICD-10-CM | POA: Insufficient documentation

## 2014-06-11 LAB — CBC
HCT: 25.8 % — ABNORMAL LOW (ref 36.0–46.0)
HEMOGLOBIN: 7.8 g/dL — AB (ref 12.0–15.0)
MCH: 20.5 pg — AB (ref 26.0–34.0)
MCHC: 30.2 g/dL (ref 30.0–36.0)
MCV: 67.7 fL — AB (ref 78.0–100.0)
PLATELETS: 378 10*3/uL (ref 150–400)
RBC: 3.81 MIL/uL — AB (ref 3.87–5.11)
RDW: 16.8 % — ABNORMAL HIGH (ref 11.5–15.5)
WBC: 7.7 10*3/uL (ref 4.0–10.5)

## 2014-06-11 LAB — BASIC METABOLIC PANEL
Anion gap: 11 (ref 5–15)
BUN: 6 mg/dL (ref 6–23)
CALCIUM: 9.4 mg/dL (ref 8.4–10.5)
CO2: 24 meq/L (ref 19–32)
CREATININE: 0.62 mg/dL (ref 0.50–1.10)
Chloride: 103 mEq/L (ref 96–112)
GFR calc Af Amer: >90 mL/min (ref >90)
GLUCOSE: 102 mg/dL — AB (ref 70–99)
Potassium: 3.9 mEq/L (ref 3.7–5.3)
SODIUM: 138 meq/L (ref 137–147)

## 2014-06-11 LAB — CBG MONITORING, ED: Glucose-Capillary: 117 mg/dL — ABNORMAL HIGH (ref 70–99)

## 2014-06-11 MED ORDER — FERROUS SULFATE 325 (65 FE) MG PO TABS: 325.0000 mg | ORAL_TABLET | Freq: Every day | ORAL | Status: DC

## 2014-06-11 NOTE — Discharge Instructions (Signed)
Return to the ED with any concerns including fainting, chest pain, difficulty breathing, weakness of arms or legs, decreased level of alertness/lethargy, or any other alarming symptoms

## 2014-06-11 NOTE — ED Notes (Signed)
She states "my left arms been tingly for 3 days then today the left side of my face started to feel tingly." denies pain. shes A&Ox4, speech is clear, mae

## 2014-06-11 NOTE — ED Provider Notes (Signed)
CSN: [id number]     Arrival date & time 06/11/14  1214 History   First MD Initiated Contact with Patient 06/11/14 1328     Chief Complaint  Patient presents with  . Tingling     (Consider location/radiation/quality/duration/timing/severity/associated sxs/prior Treatment) HPI Pt presents with c/o feeling tingly. She states she has intermittent tingling in left arm and hand, also sometimes in face.  At times feels a twitching in the right side of her face.  No weakness of arms or legs, no changes in vision or speech.  No fainting or lightheadedness.  Has had some neck pain, but not today.  No injury of neck.  She has had no treatment prior to arrival.  Nothing that makes it better or worse. There are no other associated systemic symptoms, there are no other alleviating or modifying factors.   Past Medical History  Diagnosis Date  . Obesity    Past Surgical History  Procedure Laterality Date  . Cesarean section     History reviewed. No pertinent family history. History  Substance Use Topics  . Smoking status: Current Every Day Smoker  . Smokeless tobacco: Not on file  . Alcohol Use: Yes   OB History   Grav Para Term Preterm Abortions TAB SAB Ect Mult Living                 Review of Systems ROS reviewed and all otherwise negative except for mentioned in HPI    Allergies  Review of patient's allergies indicates no known allergies.  Home Medications   Prior to Admission medications   Medication Sig Start Date End Date Taking? Authorizing Provider  ferrous sulfate 325 (65 FE) MG tablet Take 1 tablet (325 mg total) by mouth daily. 06/11/14   Threasa Beards, MD   BP 123/82  Pulse 73  Temp(Src) 98.3 F (36.8 C) (Oral)  Resp 17  Ht 5\' 1"  (1.549 m)  Wt 240 lb (108.863 kg)  BMI 45.37 kg/m2  SpO2 100% Vitals reviewed Physical Exam Physical Examination: General appearance - alert, well appearing, and in no distress Mental status - alert, oriented to person, place, and  time Eyes - pupils equal and reactive, extraocular eye movements intact  Mouth - mucous membranes moist, pharynx normal without lesions Chest - clear to auscultation, no wheezes, rales or rhonchi, symmetric air entry Heart - normal rate, regular rhythm, normal S1, S2, no murmurs, rubs, clicks or gallops Abdomen - soft, nontender, nondistended, no masses or organomegaly Neurological - alert, oriented x 3, normal speech, cranial nerves 2-12 tested and intact, strength 5/5 in extremities x 4, sensation intact, no numbness to light touch on forearm or hand on left Extremities - peripheral pulses normal, no pedal edema, no clubbing or cyanosis Skin - normal coloration and turgor, no rashes  ED Course  Procedures (including critical care time) Labs Review Labs Reviewed  CBC - Abnormal; Notable for the following:    RBC 3.81 (*)    Hemoglobin 7.8 (*)    HCT 25.8 (*)    MCV 67.7 (*)    MCH 20.5 (*)    RDW 16.8 (*)    All other components within normal limits  BASIC METABOLIC PANEL - Abnormal; Notable for the following:    Glucose, Bld 102 (*)    All other components within normal limits  CBG MONITORING, ED - Abnormal; Notable for the following:    Glucose-Capillary 117 (*)    All other components within normal limits  Imaging Review No results found.   EKG Interpretation   Date/Time:  Sunday June 11 2014 12:23:35 EDT Ventricular Rate:  100 PR Interval:  148 QRS Duration: 72 QT Interval:  344 QTC Calculation: 443 R Axis:   46 Text Interpretation:  Normal sinus rhythm Normal ECG No significant change  since last tracing Confirmed by [facility]  MD, Gracemarie Skeet [phone]) on 06/11/2014  2:08:33 PM      MDM   Final diagnoses:  Tingling in extremities  Anemia, unspecified anemia type    Pt presenting with c/o tingling intermittently in arm and hand- normal neurologic exam in the ED today- doubt TIA or stroke.  Today found to be anemic- states she has heavy periods and has just  finished her menses.  This is the most likely cause in her case- denies melena.  Advised to take iron supplements, given information to obtain PMD.      Threasa Beards, MD 06/12/14 [phone]

## 2014-10-22 ENCOUNTER — Emergency Department (HOSPITAL_COMMUNITY): Payer: Self-pay

## 2014-10-22 ENCOUNTER — Emergency Department (HOSPITAL_COMMUNITY)
Admission: EM | Admit: 2014-10-22 | Discharge: 2014-10-22 | Disposition: A | Discharge status: Home / Self Care | Payer: Self-pay | Attending: Emergency Medicine | Admitting: Emergency Medicine

## 2014-10-22 ENCOUNTER — Encounter (HOSPITAL_COMMUNITY): Payer: Self-pay | Admitting: Emergency Medicine

## 2014-10-22 DIAGNOSIS — S90112A Contusion of left great toe without damage to nail, initial encounter: Secondary | ICD-10-CM | POA: Insufficient documentation

## 2014-10-22 DIAGNOSIS — W1839XA Other fall on same level, initial encounter: Secondary | ICD-10-CM | POA: Insufficient documentation

## 2014-10-22 DIAGNOSIS — E669 Obesity, unspecified: Secondary | ICD-10-CM | POA: Insufficient documentation

## 2014-10-22 DIAGNOSIS — Z720 Tobacco use: Secondary | ICD-10-CM | POA: Insufficient documentation

## 2014-10-22 DIAGNOSIS — Y9389 Activity, other specified: Secondary | ICD-10-CM | POA: Insufficient documentation

## 2014-10-22 DIAGNOSIS — Y9289 Other specified places as the place of occurrence of the external cause: Secondary | ICD-10-CM | POA: Insufficient documentation

## 2014-10-22 DIAGNOSIS — M545 Low back pain, unspecified: Secondary | ICD-10-CM

## 2014-10-22 DIAGNOSIS — S3992XA Unspecified injury of lower back, initial encounter: Secondary | ICD-10-CM | POA: Insufficient documentation

## 2014-10-22 DIAGNOSIS — W19XXXA Unspecified fall, initial encounter: Secondary | ICD-10-CM

## 2014-10-22 DIAGNOSIS — S90122A Contusion of left lesser toe(s) without damage to nail, initial encounter: Secondary | ICD-10-CM

## 2014-10-22 DIAGNOSIS — Y998 Other external cause status: Secondary | ICD-10-CM | POA: Insufficient documentation

## 2014-10-22 MED ORDER — TRAMADOL HCL 50 MG PO TABS: 50.0000 mg | ORAL_TABLET | Freq: Four times a day (QID) | ORAL | Status: DC | PRN

## 2014-10-22 NOTE — ED Provider Notes (Signed)
CSN: [id number]     Arrival date & time 10/22/14  2006 History  This chart was scribed for non-physician practitioner, Glendell Docker, NP, working with Mariea Clonts, MD, by Ian Bushman, ED Scribe. This patient was seen in room TR06C/TR06C and the patient's care was started at 8:42 PM.   Chief Complaint  Patient presents with  . Fall     (Consider location/radiation/quality/duration/timing/severity/associated sxs/prior Treatment) The history is provided by the patient. No language interpreter was used.    HPI Comments: Latoya Juarez is a 44 y.o. female who presents to the Emergency Department complaining of left foot pain and lower back pain that occurred after falling while she was exiting her car this morning.  Patient states that she is unable to move her fourth toe on her left foot and has broken a toe before.  She denies losing consciousness with the fall. Hasn't taken anything for the symptoms  Past Medical History  Diagnosis Date  . Obesity    Past Surgical History  Procedure Laterality Date  . Cesarean section     No family history on file. History  Substance Use Topics  . Smoking status: Current Every Day Smoker  . Smokeless tobacco: Not on file  . Alcohol Use: Yes   OB History    No data available     Review of Systems  Constitutional: Negative for fever.  Musculoskeletal: Positive for myalgias and back pain.  Neurological: Negative for syncope.  All other systems reviewed and are negative.     Allergies  Review of patient's allergies indicates no known allergies.  Home Medications   Prior to Admission medications   Medication Sig Start Date End Date Taking? Authorizing Provider  ferrous sulfate 325 (65 FE) MG tablet Take 1 tablet (325 mg total) by mouth daily. 06/11/14   Threasa Beards, MD   BP 141/73 mmHg  Pulse 95  Temp(Src) 97.7 F (36.5 C) (Oral)  Resp 16  SpO2 99%  LMP 10/16/2014 Physical Exam  Constitutional: She is oriented to  person, place, and time. She appears well-developed and well-nourished.  HENT:  Head: Normocephalic and atraumatic.  Cardiovascular: Normal rate and regular rhythm.   Pulmonary/Chest: Effort normal and breath sounds normal. She exhibits no tenderness.  Abdominal: Soft. Bowel sounds are normal. There is no tenderness.  Musculoskeletal:       Cervical back: Normal.       Thoracic back: Normal.       Lumbar back: Normal.  Sacryl/coccyx tenderness. Moving all extremities without any problem:good sensation to all extremities  Neurological: She is alert and oriented to person, place, and time. She exhibits normal muscle tone. Coordination normal.  Skin:  Bruising noted to the left fourth toe  Nursing note and vitals reviewed.   ED Course  Procedures (including critical care time) DIAGNOSTIC STUDIES: Oxygen Saturation is 99% on RA, normal by my interpretation.    COORDINATION OF CARE: 8:44 PM Discussed treatment plan with patient at beside, the patient agrees with the plan and has no further questions at this time.  Labs Review Labs Reviewed - No data to display  Imaging Review Dg Sacrum/coccyx  10/22/2014   CLINICAL DATA:  Patient fell this morning on to the tail bone. Now with constant pain. No prior injuries.  EXAM: SACRUM AND COCCYX - 2+ VIEW  COMPARISON:  10/04/2011  FINDINGS: There is no evidence of fracture or other focal bone lesions. Sacralization of L5 bilaterally.  IMPRESSION: Negative.   Electronically Signed  By: Lucienne Capers M.D.   On: 10/22/2014 21:24   Dg Foot Complete Left  10/22/2014   CLINICAL DATA:  Patient fell this morning with subsequent pain in the fourth toe. Pain radiates anteriorly up the foot.  EXAM: LEFT FOOT - COMPLETE 3+ VIEW  COMPARISON:  Left second toe 12/22/2004  FINDINGS: There is no evidence of fracture or dislocation. There is no evidence of arthropathy or other focal bone abnormality. Soft tissues are unremarkable.  IMPRESSION: Negative.    Electronically Signed   By: Lucienne Capers M.D.   On: 10/22/2014 21:25     EKG Interpretation None      MDM   Final diagnoses:  Fall  Toe contusion, left, initial encounter  Midline low back pain without sciatica    Mechanical fall. No acute bony abnormality noted. Pt is neurologically intact. Will treat pain with ultram and pt given ortho follow up  I personally performed the services described in this documentation, which was scribed in my presence. The recorded information has been reviewed and is accurate.    Glendell Docker, NP 10/22/14 2135  Mariea Clonts, MD 10/22/14 [phone]

## 2014-10-22 NOTE — ED Notes (Signed)
Pt c/o low back pain and L foot pain after losing balance and falling yesterday. Swelling noted to L foot; cms intact

## 2014-10-22 NOTE — Discharge Instructions (Signed)
Back Pain, Adult Back pain is very common. The pain often gets better over time. The cause of back pain is usually not dangerous. Most people can learn to manage their back pain on their own.  HOME CARE   Stay active. Start with short walks on flat ground if you can. Try to walk farther each day.  Do not sit, drive, or stand in one place for more than 30 minutes. Do not stay in bed.  Do not avoid exercise or work. Activity can help your back heal faster.  Be careful when you bend or lift an object. Bend at your knees, keep the object close to you, and do not twist.  Sleep on a firm mattress. Lie on your side, and bend your knees. If you lie on your back, put a pillow under your knees.  Only take medicines as told by your doctor.  Put ice on the injured area.  Put ice in a plastic bag.  Place a towel between your skin and the bag.  Leave the ice on for 15-20 minutes, 03-04 times a day for the first 2 to 3 days. After that, you can switch between ice and heat packs.  Ask your doctor about back exercises or massage.  Avoid feeling anxious or stressed. Find good ways to deal with stress, such as exercise. GET HELP RIGHT AWAY IF:   Your pain does not go away with rest or medicine.  Your pain does not go away in 1 week.  You have new problems.  You do not feel well.  The pain spreads into your legs.  You cannot control when you poop (bowel movement) or pee (urinate).  Your arms or legs feel weak or lose feeling (numbness).  You feel sick to your stomach (nauseous) or throw up (vomit).  You have belly (abdominal) pain.  You feel like you may pass out (faint). MAKE SURE YOU:   Understand these instructions.  Will watch your condition.  Will get help right away if you are not doing well or get worse. Document Released: 03/24/2008 Document Revised: 12/29/2011 Document Reviewed: 02/07/2014 [facility] Patient Information 2015 [place], [place]. This information is not intended  to replace advice given to you by your health care provider. Make sure you discuss any questions you have with your health care provider.  Contusion A contusion is a deep bruise. Contusions are the result of an injury that caused bleeding under the skin. The contusion may turn blue, purple, or yellow. Minor injuries will give you a painless contusion, but more severe contusions may stay painful and swollen for a few weeks.  CAUSES  A contusion is usually caused by a blow, trauma, or direct force to an area of the body. SYMPTOMS   Swelling and redness of the injured area.  Bruising of the injured area.  Tenderness and soreness of the injured area.  Pain. DIAGNOSIS  The diagnosis can be made by taking a history and physical exam. An X-ray, CT scan, or MRI may be needed to determine if there were any associated injuries, such as fractures. TREATMENT  Specific treatment will depend on what area of the body was injured. In general, the best treatment for a contusion is resting, icing, elevating, and applying cold compresses to the injured area. Over-the-counter medicines may also be recommended for pain control. Ask your caregiver what the best treatment is for your contusion. HOME CARE INSTRUCTIONS   Put ice on the injured area.  Put ice in a plastic  bag.  Place a towel between your skin and the bag.  Leave the ice on for 15-20 minutes, 3-4 times a day, or as directed by your health care provider.  Only take over-the-counter or prescription medicines for pain, discomfort, or fever as directed by your caregiver. Your caregiver may recommend avoiding anti-inflammatory medicines (aspirin, ibuprofen, and naproxen) for 48 hours because these medicines may increase bruising.  Rest the injured area.  If possible, elevate the injured area to reduce swelling. SEEK IMMEDIATE MEDICAL CARE IF:   You have increased bruising or swelling.  You have pain that is getting worse.  Your swelling or  pain is not relieved with medicines. MAKE SURE YOU:   Understand these instructions.  Will watch your condition.  Will get help right away if you are not doing well or get worse. Document Released: 07/16/2005 Document Revised: 10/11/2013 Document Reviewed: 08/11/2011 [facility] Patient Information 2015 [place], [place]. This information is not intended to replace advice given to you by your health care provider. Make sure you discuss any questions you have with your health care provider.

## 2014-10-22 NOTE — ED Notes (Signed)
Pt. lost her balance and fell backwards this morning , no LOC / ambulatory , reports pain at left foot and lower back .

## 2015-10-17 ENCOUNTER — Encounter (HOSPITAL_COMMUNITY): Payer: Self-pay | Admitting: Emergency Medicine

## 2015-10-17 ENCOUNTER — Emergency Department (HOSPITAL_COMMUNITY)
Admission: EM | Admit: 2015-10-17 | Discharge: 2015-10-17 | Disposition: A | Discharge status: Home / Self Care | Payer: Self-pay | Attending: Emergency Medicine | Admitting: Emergency Medicine

## 2015-10-17 DIAGNOSIS — R0981 Nasal congestion: Secondary | ICD-10-CM | POA: Insufficient documentation

## 2015-10-17 DIAGNOSIS — H9191 Unspecified hearing loss, right ear: Secondary | ICD-10-CM | POA: Insufficient documentation

## 2015-10-17 DIAGNOSIS — F17200 Nicotine dependence, unspecified, uncomplicated: Secondary | ICD-10-CM | POA: Insufficient documentation

## 2015-10-17 DIAGNOSIS — H9201 Otalgia, right ear: Secondary | ICD-10-CM | POA: Insufficient documentation

## 2015-10-17 DIAGNOSIS — E669 Obesity, unspecified: Secondary | ICD-10-CM | POA: Insufficient documentation

## 2015-10-17 DIAGNOSIS — I10 Essential (primary) hypertension: Secondary | ICD-10-CM | POA: Insufficient documentation

## 2015-10-17 MED ORDER — FLUTICASONE PROPIONATE 50 MCG/ACT NA SUSP: 2.0000 | Freq: Every day | NASAL | Status: DC

## 2015-10-17 NOTE — ED Provider Notes (Signed)
CSN: [id number]     Arrival date & time 10/17/15  1122 History   First MD Initiated Contact with Patient 10/17/15 1723     Chief Complaint  Patient presents with  . Otalgia     (Consider location/radiation/quality/duration/timing/severity/associated sxs/prior Treatment) HPI Comments: Latoya Juarez is a 44 y.o. female with a PMHx of obesity, who presents to the ED with complaints of chronic right otalgia 2 months. She describes her pain is 7/10 intermittent aching in the right ear which intermittently radiates to the throat, with no known aggravating factors, and relieved mildly with Tylenol. She currently denies any sore throat. Associated symptoms include mildly diminished hearing in the right ear. she states that she has a qtip in the R ear that's been there x1 year, was told to f/up with ENT but hasn't had the finances to go. No PCP. She denies any fevers, chills, ear drainage, rhinorrhea, sinus pressure, ongoing sore throat, cough, chest pain, shortness breath, abdominal pain, nausea, vomiting, diarrhea, constipation, dysuria, hematuria, numbness, tingling, or weakness. She denies any sick contacts.  Of note, no PMHx of HTN but pt found to be hypertensive today.  Patient is a 44 y.o. female presenting with ear pain. The history is provided by the patient. No language interpreter was used.  Otalgia Location:  Right Behind ear:  No abnormality Quality:  Aching Severity:  Mild Onset quality:  Gradual Duration:  2 months Timing:  Intermittent Progression:  Unchanged Chronicity:  Recurrent Context: foreign body (Qtip in R ear x1 yr)   Relieved by:  OTC medications Worsened by:  Nothing tried Ineffective treatments:  None tried Associated symptoms: hearing loss (R ear)   Associated symptoms: no abdominal pain, no cough, no diarrhea, no ear discharge, no fever, no rhinorrhea, no sore throat and no vomiting     Past Medical History  Diagnosis Date  . Obesity    Past Surgical  History  Procedure Laterality Date  . Cesarean section     No family history on file. Social History  Substance Use Topics  . Smoking status: Current Every Day Smoker  . Smokeless tobacco: None  . Alcohol Use: Yes     Comment: socially   OB History    No data available     Review of Systems  Constitutional: Negative for fever and chills.  HENT: Positive for ear pain and hearing loss (R ear). Negative for drooling, ear discharge, rhinorrhea, sinus pressure, sore throat and trouble swallowing.   Respiratory: Negative for cough and shortness of breath.   Cardiovascular: Negative for chest pain.  Gastrointestinal: Negative for nausea, vomiting, abdominal pain, diarrhea and constipation.  Genitourinary: Negative for dysuria and hematuria.  Musculoskeletal: Negative for myalgias and arthralgias.  Skin: Negative for color change.  Allergic/Immunologic: Negative for immunocompromised state.  Neurological: Negative for weakness and numbness.  Psychiatric/Behavioral: Negative for confusion.   10 Systems reviewed and are negative for acute change except as noted in the HPI.    Allergies  Review of patient's allergies indicates no known allergies.  Home Medications   Prior to Admission medications   Medication Sig Start Date End Date Taking? Authorizing Provider  ferrous sulfate 325 (65 FE) MG tablet Take 1 tablet (325 mg total) by mouth daily. 06/11/14   Alfonzo Beers, MD  traMADol (ULTRAM) 50 MG tablet Take 1 tablet (50 mg total) by mouth every 6 (six) hours as needed. 10/22/14   Glendell Docker, NP   BP 176/97 mmHg  Pulse 99  Temp(Src) 98.1  F (36.7 C)  Resp 18  Ht 5\' 1"  (1.549 m)  Wt 111.131 kg  BMI 46.32 kg/m2  SpO2 100%  LMP 10/12/2015 (Exact Date) Physical Exam  Constitutional: She is oriented to person, place, and time. Vital signs are normal. She appears well-developed and well-nourished.  Non-toxic appearance. No distress.  Afebrile, nontoxic, NAD. Mildly  hypertensive  HENT:  Head: Normocephalic and atraumatic.  Right Ear: Hearing, tympanic membrane, external ear and ear canal normal.  Left Ear: Hearing, tympanic membrane, external ear and ear canal normal.  Nose: Mucosal edema present. No rhinorrhea.  Mouth/Throat: Uvula is midline, oropharynx is clear and moist and mucous membranes are normal. No trismus in the jaw. No uvula swelling.  Ears are clear bilaterally. No evidence of retained Qtip in R ear canal. Canals clear bilaterally.  Nose with mild mucosal edema without rhinorrhea. Oropharynx clear and moist, without uvular swelling or deviation, no trismus or drooling, no tonsillar swelling or erythema, no exudates.    Eyes: Conjunctivae and EOM are normal. Right eye exhibits no discharge. Left eye exhibits no discharge.  Neck: Normal range of motion. Neck supple.  Cardiovascular: Normal rate and intact distal pulses.   Pulmonary/Chest: Effort normal. No respiratory distress.  Abdominal: Normal appearance. She exhibits no distension.  Musculoskeletal: Normal range of motion.  Neurological: She is alert and oriented to person, place, and time. She has normal strength. No sensory deficit.  Skin: Skin is warm, dry and intact. No rash noted.  Psychiatric: She has a normal mood and affect. Her behavior is normal.  Nursing note and vitals reviewed.   ED Course  Procedures (including critical care time) Labs Review Labs Reviewed - No data to display  Imaging Review No results found. I have personally reviewed and evaluated these images and lab results as part of my medical decision-making.   EKG Interpretation None      MDM   Final diagnoses:  Otalgia, right  HTN (hypertension), benign  Sinus congestion    44 y.o. female here with R otalgia x2 months, states qtip in ear for 1 yr. On exam, TMs clear with clear canal, no Qtip noted in ear. Throat clear. Mild sinus congestion noted, could be eustachian tube dysfunction. Also noted  to be hypertensive but asymptomatic, discussed DASH diet plan and monitoring at home, f/up with [facility] in 1wk to establish care and recheck BP. Doubt need for further lab work or investigation today. Will start on flonase and f/up with [facility]. I explained the diagnosis and have given explicit precautions to return to the ER including for any other new or worsening symptoms. The patient understands and accepts the medical plan as it's been dictated and I have answered their questions. Discharge instructions concerning home care and prescriptions have been given. The patient is STABLE and is discharged to home in good condition.  BP 176/97 mmHg  Pulse 99  Temp(Src) 98.1 F (36.7 C)  Resp 18  Ht 5\' 1"  (1.549 m)  Wt 111.131 kg  BMI 46.32 kg/m2  SpO2 100%  LMP 10/12/2015 (Exact Date)  Meds ordered this encounter  Medications  . fluticasone (FLONASE) 50 MCG/ACT nasal spray    Sig: Place 2 sprays into both nostrils daily.    Dispense:  16 g    Refill:  0    Order Specific Question:  Supervising Provider    Answer:  Noemi Chapel [[id number]]       Auriana Scalia Camprubi-Soms, PA-C 10/17/15 1754  Tanna Furry, MD 10/27/15 [phone]

## 2015-10-17 NOTE — Discharge Instructions (Signed)
Continue to stay well-hydrated. Continue to alternate between Tylenol and Ibuprofen for pain or fever. Use netipot and flonase to help with nasal congestion. May consider over-the-counter Benadryl or other antihistamine to decrease secretions and to help with ear symptoms. Check your blood pressure at home twice daily and keep a log of the readings to take to your follow up appointment. Decrease the salt intake in your diet. Followup with [facility] in 5-7 days for recheck of ongoing symptoms and to establish medical care. Return to emergency department for emergent changing or worsening of symptoms.   Earache An earache, also called otalgia, can be caused by many things. Pain from an earache can be sharp, dull, or burning. The pain may be temporary or constant. Earaches can be caused by problems with the ear, such as infection in either the middle ear or the ear canal, injury, impacted ear wax, middle ear pressure, or a foreign body in the ear. Ear pain can also result from problems in other areas. This is called referred pain. For example, pain can come from a sore throat, a tooth infection, or problems with the jaw or the joint between the jaw and the skull (temporomandibular joint, or TMJ). The cause of an earache is not always easy to identify. Watchful waiting may be appropriate for some earaches until a clear cause of the pain can be found. HOME CARE INSTRUCTIONS Watch your condition for any changes. The following actions may help to lessen any discomfort that you are feeling:  Take medicines only as directed by your health care provider. This includes ear drops.  Apply ice to your outer ear to help reduce pain.  Put ice in a plastic bag.  Place a towel between your skin and the bag.  Leave the ice on for 20 minutes, 2-3 times per day.  Do not put anything in your ear other than medicine that is prescribed by your health care provider.  Try resting in an upright position  instead of lying down. This may help to reduce pressure in the middle ear and relieve pain.  Chew gum if it helps to relieve your ear pain.  Control any allergies that you have.  Keep all follow-up visits as directed by your health care provider. This is important. SEEK MEDICAL CARE IF:  Your pain does not improve within 2 days.  You have a fever.  You have new or worsening symptoms. SEEK IMMEDIATE MEDICAL CARE IF:  You have a severe headache.  You have a stiff neck.  You have difficulty swallowing.  You have redness or swelling behind your ear.  You have drainage from your ear.  You have hearing loss.  You feel dizzy.   This information is not intended to replace advice given to you by your health care provider. Make sure you discuss any questions you have with your health care provider.   Document Released: 05/23/2004 Document Revised: 10/27/2014 Document Reviewed: 05/07/2014 Elsevier Interactive Patient Education 2016 [employer].  Hypertension Hypertension, commonly called high blood pressure, is when the force of blood pumping through your arteries is too strong. Your arteries are the blood vessels that carry blood from your heart throughout your body. A blood pressure reading consists of a higher number over a lower number, such as 110/72. The higher number (systolic) is the pressure inside your arteries when your heart pumps. The lower number (diastolic) is the pressure inside your arteries when your heart relaxes. Ideally you want your blood pressure  below 120/80. Hypertension forces your heart to work harder to pump blood. Your arteries may become narrow or stiff. Having untreated or uncontrolled hypertension can cause heart attack, stroke, kidney disease, and other problems. RISK FACTORS Some risk factors for high blood pressure are controllable. Others are not.  Risk factors you cannot control include:   Race. You may be at higher risk if you are African  American.  Age. Risk increases with age.  Gender. Men are at higher risk than women before age 77 years. After age 6, women are at higher risk than men. Risk factors you can control include:  Not getting enough exercise or physical activity.  Being overweight.  Getting too much fat, sugar, calories, or salt in your diet.  Drinking too much alcohol. SIGNS AND SYMPTOMS Hypertension does not usually cause signs or symptoms. Extremely high blood pressure (hypertensive crisis) may cause headache, anxiety, shortness of breath, and nosebleed. DIAGNOSIS To check if you have hypertension, your health care provider will measure your blood pressure while you are seated, with your arm held at the level of your heart. It should be measured at least twice using the same arm. Certain conditions can cause a difference in blood pressure between your right and left arms. A blood pressure reading that is higher than normal on one occasion does not mean that you need treatment. If it is not clear whether you have high blood pressure, you may be asked to return on a different day to have your blood pressure checked again. Or, you may be asked to monitor your blood pressure at home for 1 or more weeks. TREATMENT Treating high blood pressure includes making lifestyle changes and possibly taking medicine. Living a healthy lifestyle can help lower high blood pressure. You may need to change some of your habits. Lifestyle changes may include:  Following the DASH diet. This diet is high in fruits, vegetables, and whole grains. It is low in salt, red meat, and added sugars.  Keep your sodium intake below 2,300 mg per day.  Getting at least 30-45 minutes of aerobic exercise at least 4 times per week.  Losing weight if necessary.  Not smoking.  Limiting alcoholic beverages.  Learning ways to reduce stress. Your health care provider may prescribe medicine if lifestyle changes are not enough to get your blood  pressure under control, and if one of the following is true:  You are 51-92 years of age and your systolic blood pressure is above 140.  You are 22 years of age or older, and your systolic blood pressure is above 150.  Your diastolic blood pressure is above 90.  You have diabetes, and your systolic blood pressure is over [id number] or your diastolic blood pressure is over 90.  You have kidney disease and your blood pressure is above 140/90.  You have heart disease and your blood pressure is above 140/90. Your personal target blood pressure may vary depending on your medical conditions, your age, and other factors. HOME CARE INSTRUCTIONS  Have your blood pressure rechecked as directed by your health care provider.   Take medicines only as directed by your health care provider. Follow the directions carefully. Blood pressure medicines must be taken as prescribed. The medicine does not work as well when you skip doses. Skipping doses also puts you at risk for problems.  Do not smoke.   Monitor your blood pressure at home as directed by your health care provider. SEEK MEDICAL CARE IF:   You think  you are having a reaction to medicines taken.  You have recurrent headaches or feel dizzy.  You have swelling in your ankles.  You have trouble with your vision. SEEK IMMEDIATE MEDICAL CARE IF:  You develop a severe headache or confusion.  You have unusual weakness, numbness, or feel faint.  You have severe chest or abdominal pain.  You vomit repeatedly.  You have trouble breathing. MAKE SURE YOU:   Understand these instructions.  Will watch your condition.  Will get help right away if you are not doing well or get worse.   This information is not intended to replace advice given to you by your health care provider. Make sure you discuss any questions you have with your health care provider.   Document Released: 10/06/2005 Document Revised: 02/20/2015 Document Reviewed:  07/29/2013 Elsevier Interactive Patient Education 2016 [employer].  How to Take Your Blood Pressure HOW DO I GET A BLOOD PRESSURE MACHINE?  You can buy an electronic home blood pressure machine at your local pharmacy. Insurance will sometimes cover the cost if you have a prescription.  Ask your doctor what type of machine is best for you. There are different machines for your arm and your wrist.  If you decide to buy a machine to check your blood pressure on your arm, first check the size of your arm so you can buy the right size cuff. To check the size of your arm:   Use a measuring tape that shows both inches and centimeters.   Wrap the measuring tape around the upper-middle part of your arm. You may need someone to help you measure.   Write down your arm measurement in both inches and centimeters.   To measure your blood pressure correctly, it is important to have the right size cuff.   If your arm is up to 13 inches (up to 34 centimeters), get an adult cuff size.  If your arm is 13 to 17 inches (35 to 44 centimeters), get a large adult cuff size.    If your arm is 17 to 20 inches (45 to 52 centimeters), get an adult thigh cuff.  WHAT DO THE NUMBERS MEAN?   There are two numbers that make up your blood pressure. For example: 120/80.  The first number (120 in our example) is called the "systolic pressure." It is a measure of the pressure in your blood vessels when your heart is pumping blood.  The second number (80 in our example) is called the "diastolic pressure." It is a measure of the pressure in your blood vessels when your heart is resting between beats.  Your doctor will tell you what your blood pressure should be. WHAT SHOULD I DO BEFORE I CHECK MY BLOOD PRESSURE?   Try to rest or relax for at least 30 minutes before you check your blood pressure.  Do not smoke.  Do not have any drinks with caffeine, such as:  Soda.  Coffee.  Tea.  Check your blood  pressure in a quiet room.  Sit down and stretch out your arm on a table. Keep your arm at about the level of your heart. Let your arm relax.  Make sure that your legs are not crossed. HOW DO I CHECK MY BLOOD PRESSURE?  Follow the directions that came with your machine.  Make sure you remove any tight-fitting clothing from your arm or wrist. Wrap the cuff around your upper arm or wrist. You should be able to fit a finger between  the cuff and your arm. If you cannot fit a finger between the cuff and your arm, it is too tight and should be removed and rewrapped.  Some units require you to manually pump up the arm cuff.  Automatic units inflate the cuff when you press a button.  Cuff deflation is automatic in both models.  After the cuff is inflated, the unit measures your blood pressure and pulse. The readings are shown on a monitor. Hold still and breathe normally while the cuff is inflated.  Getting a reading takes less than a minute.  Some models store readings in a memory. Some provide a printout of readings. If your machine does not store your readings, keep a written record.  Take readings with you to your next visit with your doctor.   This information is not intended to replace advice given to you by your health care provider. Make sure you discuss any questions you have with your health care provider.   Document Released: 09/18/2008 Document Revised: 10/27/2014 Document Reviewed: 12/01/2013 Elsevier Interactive Patient Education 2016 [place] DASH stands for "Dietary Approaches to Stop Hypertension." The DASH eating plan is a healthy eating plan that has been shown to reduce high blood pressure (hypertension). Additional health benefits may include reducing the risk of type 2 diabetes mellitus, heart disease, and stroke. The DASH eating plan may also help with weight loss. WHAT DO I NEED TO KNOW ABOUT THE DASH EATING PLAN? For the DASH eating plan, you  will follow these general guidelines:  Choose foods with a percent daily value for sodium of less than 5% (as listed on the food label).  Use salt-free seasonings or herbs instead of table salt or sea salt.  Check with your health care provider or pharmacist before using salt substitutes.  Eat lower-sodium products, often labeled as "lower sodium" or "no salt added."  Eat fresh foods.  Eat more vegetables, fruits, and low-fat dairy products.  Choose whole grains. Look for the word "whole" as the first word in the ingredient list.  Choose fish and skinless chicken or [place] more often than red meat. Limit fish, poultry, and meat to 6 oz (170 g) each day.  Limit sweets, desserts, sugars, and sugary drinks.  Choose heart-healthy fats.  Limit cheese to 1 oz (28 g) per day.  Eat more home-cooked food and less restaurant, buffet, and fast food.  Limit fried foods.  Cook foods using methods other than frying.  Limit canned vegetables. If you do use them, rinse them well to decrease the sodium.  When eating at a restaurant, ask that your food be prepared with less salt, or no salt if possible. WHAT FOODS CAN I EAT? Seek help from a dietitian for individual calorie needs. Grains Whole grain or whole wheat bread. Brown rice. Whole grain or whole wheat pasta. Quinoa, bulgur, and whole grain cereals. Low-sodium cereals. Corn or whole wheat flour tortillas. Whole grain cornbread. Whole grain crackers. Low-sodium crackers. Vegetables Fresh or frozen vegetables (raw, steamed, roasted, or grilled). Low-sodium or reduced-sodium tomato and vegetable juices. Low-sodium or reduced-sodium tomato sauce and paste. Low-sodium or reduced-sodium canned vegetables.  Fruits All fresh, canned (in natural juice), or frozen fruits. Meat and Other Protein Products Ground beef (85% or leaner), grass-fed beef, or beef trimmed of fat. Skinless chicken or [place]. Ground chicken or [place]. Pork trimmed of fat.  All fish and seafood. Eggs. Dried beans, peas, or lentils. Unsalted nuts and seeds. Unsalted canned beans.  Dairy Low-fat dairy products, such as skim or 1% milk, 2% or reduced-fat cheeses, low-fat ricotta or cottage cheese, or plain low-fat yogurt. Low-sodium or reduced-sodium cheeses. Fats and Oils Tub margarines without trans fats. Light or reduced-fat mayonnaise and salad dressings (reduced sodium). Avocado. Safflower, olive, or canola oils. Natural peanut or almond butter. Other Unsalted popcorn and pretzels. The items listed above may not be a complete list of recommended foods or beverages. Contact your dietitian for more options. WHAT FOODS ARE NOT RECOMMENDED? Grains White bread. White pasta. White rice. Refined cornbread. Bagels and croissants. Crackers that contain trans fat. Vegetables Creamed or fried vegetables. Vegetables in a cheese sauce. Regular canned vegetables. Regular canned tomato sauce and paste. Regular tomato and vegetable juices. Fruits Dried fruits. Canned fruit in light or heavy syrup. Fruit juice. Meat and Other Protein Products Fatty cuts of meat. Ribs, chicken wings, bacon, sausage, bologna, salami, chitterlings, fatback, hot dogs, bratwurst, and packaged luncheon meats. Salted nuts and seeds. Canned beans with salt. Dairy Whole or 2% milk, cream, half-and-half, and cream cheese. Whole-fat or sweetened yogurt. Full-fat cheeses or blue cheese. Nondairy creamers and whipped toppings. Processed cheese, cheese spreads, or cheese curds. Condiments Onion and garlic salt, seasoned salt, table salt, and sea salt. Canned and packaged gravies. Worcestershire sauce. Tartar sauce. Barbecue sauce. Teriyaki sauce. Soy sauce, including reduced sodium. Steak sauce. Fish sauce. Oyster sauce. Cocktail sauce. Horseradish. Ketchup and mustard. Meat flavorings and tenderizers. Bouillon cubes. Hot sauce. Tabasco sauce. Marinades. Taco seasonings. Relishes. Fats and Oils Butter, stick  margarine, lard, shortening, ghee, and bacon fat. Coconut, palm kernel, or palm oils. Regular salad dressings. Other Pickles and olives. Salted popcorn and pretzels. The items listed above may not be a complete list of foods and beverages to avoid. Contact your dietitian for more information. WHERE CAN I FIND MORE INFORMATION? National Heart, Lung, and Blood Institute: travelstabloid.com   This information is not intended to replace advice given to you by your health care provider. Make sure you discuss any questions you have with your health care provider.   Document Released: 09/25/2011 Document Revised: 10/27/2014 Document Reviewed: 08/10/2013 Elsevier Interactive Patient Education [employer].

## 2015-10-17 NOTE — ED Notes (Signed)
States has right ear pain with sore throat, had a piece of q-tip in ear last year and never got it out- was referred to ENT but couldn't go due to no money.

## 2015-11-27 ENCOUNTER — Ambulatory Visit: Payer: Self-pay | Attending: Physician Assistant | Admitting: Physician Assistant

## 2015-11-27 VITALS — BP 182/99 | HR 84 | Temp 98.1°F | Resp 18 | Ht 61.0 in | Wt 253.0 lb

## 2015-11-27 DIAGNOSIS — H9201 Otalgia, right ear: Secondary | ICD-10-CM | POA: Insufficient documentation

## 2015-11-27 DIAGNOSIS — I10 Essential (primary) hypertension: Secondary | ICD-10-CM | POA: Insufficient documentation

## 2015-11-27 DIAGNOSIS — T161XXD Foreign body in right ear, subsequent encounter: Secondary | ICD-10-CM

## 2015-11-27 DIAGNOSIS — Z79899 Other long term (current) drug therapy: Secondary | ICD-10-CM | POA: Insufficient documentation

## 2015-11-27 DIAGNOSIS — Z9889 Other specified postprocedural states: Secondary | ICD-10-CM | POA: Insufficient documentation

## 2015-11-27 DIAGNOSIS — E669 Obesity, unspecified: Secondary | ICD-10-CM | POA: Insufficient documentation

## 2015-11-27 MED ORDER — LISINOPRIL 10 MG PO TABS: 10.0000 mg | ORAL_TABLET | Freq: Every day | ORAL | Status: DC

## 2015-11-27 MED FILL — LISINOPRIL 10 MG TABLET: 10 | 30 days supply | Qty: 30 | Fill #0

## 2015-11-27 NOTE — Progress Notes (Signed)
Patient's her ED f/up right Otalgia.   Patient states that she's still having HA's from Q-tip use, described as irritating and causing hearing lose.   Patient reports never having BP issues.   Patient declines flu shot.

## 2015-11-27 NOTE — Progress Notes (Signed)
Latoya Juarez  [id number]  [id number]  DOB - [birth year]  Chief Complaint  Patient presents with  . Follow-up  . Otalgia  . Foreign Body in Ear       Subjective:   Latoya Juarez is a 45 y.o. female here today for establishment of care.  She actually has not been seen in the emergency room since December of 2016. At that time she presented with complaints of chronic right otalgia worsening over 2 months. She describes her pain is 7/10 intermittent aching in the right ear which intermittently radiates to the throat, with no known aggravating factors, and relieved mildly with Tylenol. She currently denies any sore throat. Associated symptoms include mildly diminished hearing in the right ear. She states that she has a qtip in the R ear that's been there >1 year, was told to f/up with ENT but hasn't had the finances to go. No PCP. She denies any fevers, chills, ear drainage, rhinorrhea, sinus pressure, ongoing sore throat, cough, chest pain, shortness breath, abdominal pain, nausea, vomiting, diarrhea, constipation, dysuria, hematuria, numbness, tingling, or weakness.  Her symptoms have worsened.  Her hearing has decreased on the right. She feels like there is a knot underneath her ear.  ROS: GEN: denies fever or chills, denies change in weight HEENT: + headache, +earache, epistaxis, sore throat, or neck pain LUNGS: denies SHOB, dyspnea, PND, orthopnea CV: denies CP or palpitations EXT: denies muscle spasms or swelling; no pain in lower ext, no weakness NEURO: denies numbness or tingling, denies sz, stroke or TIA  ALLERGIES: No Known Allergies  PAST MEDICAL HISTORY: Past Medical History  Diagnosis Date  . Obesity     PAST SURGICAL HISTORY: Past Surgical History  Procedure Laterality Date  . Cesarean section      MEDICATIONS AT HOME: Prior to Admission medications   Medication Sig Start Date End Date Taking? Authorizing Provider  ferrous sulfate 325 (65 FE) MG  tablet Take 1 tablet (325 mg total) by mouth daily. Patient not taking: Reported on 11/27/2015 06/11/14   Alfonzo Beers, MD  fluticasone [facility]) 50 MCG/ACT nasal spray Place 2 sprays into both nostrils daily. Patient not taking: Reported on 11/27/2015 10/17/15   Mercedes Camprubi-Soms, PA-C  lisinopril (PRINIVIL,ZESTRIL) 10 MG tablet Take 1 tablet (10 mg total) by mouth daily. 11/27/15   Brayton Caves, PA-C  traMADol (ULTRAM) 50 MG tablet Take 1 tablet (50 mg total) by mouth every 6 (six) hours as needed. Patient not taking: Reported on 11/27/2015 10/22/14   Glendell Docker, NP     Objective:   Filed Vitals:   11/27/15 1046  BP: 182/99  Pulse: 84  Temp: 98.1 F (36.7 C)  TempSrc: Oral  Resp: 18  Height: 5\' 1"  (1.549 m)  Weight: 253 lb (114.76 kg)  SpO2: 98%    Exam General appearance : Awake, alert, not in any distress. Speech Clear. Not toxic looking HEENT: Atraumatic and Normocephalic, pupils equally reactive to light and accomodation; ears-no foreign object visualized Neck: supple, no JVD. No cervical lymphadenopathy.  Chest:Good air entry bilaterally, no added sounds  CVS: S1 S2 regular, no murmurs.  Extremities: B/L Lower Ext shows no edema, both legs are warm to touch Neurology: Awake alert, and oriented X 3, CN II-XII intact, Non focal   Assessment & Plan  1. Right Otalgia/? Foreign object Right ear  -ENT referral 2. HTN  -DASH diet   -Start ACE inhibitor  -BMP at next visit  -increase exercise/weight loss    Return  in about 2 weeks (around 12/11/2015). for BP check and then 4 weeks for routine health maintenance and BMP.  The patient was given clear instructions to go to ER or return to medical center if symptoms don't improve, worsen or new problems develop. The patient verbalized understanding. The patient was told to call to get lab results if they haven't heard anything in the next week.   This note has been created with Engineer, agricultural. Any transcriptional errors are unintentional.    Zettie Pho, PA-C [facility] [place], [place]   11/27/2015, 12:23 PM

## 2015-12-12 ENCOUNTER — Ambulatory Visit: Payer: Self-pay | Attending: Internal Medicine | Admitting: Internal Medicine

## 2015-12-12 ENCOUNTER — Encounter: Payer: Self-pay | Admitting: Internal Medicine

## 2015-12-12 VITALS — BP 160/94 | HR 87 | Temp 98.0°F | Resp 17 | Ht 61.0 in | Wt 258.0 lb

## 2015-12-12 DIAGNOSIS — I10 Essential (primary) hypertension: Secondary | ICD-10-CM

## 2015-12-12 DIAGNOSIS — Z79899 Other long term (current) drug therapy: Secondary | ICD-10-CM | POA: Insufficient documentation

## 2015-12-12 DIAGNOSIS — T161XXD Foreign body in right ear, subsequent encounter: Secondary | ICD-10-CM

## 2015-12-12 NOTE — Progress Notes (Signed)
Patient here for follow up on her HTN Patient's blood pressure is elevated but states she has not yet Taken her medication today Patient also complains of pain to her right ear

## 2015-12-12 NOTE — Patient Instructions (Signed)
Please apply for [facility] discount so that I can get you set up with a Ear, Nose, and Throat doctor  DO not eat before your next appointment in 2 weeks so that I can do your blood test. Please take medication before appointment.

## 2015-12-12 NOTE — Progress Notes (Addendum)
Patient ID: Latoya Juarez, female   DOB: 26-Jan-1971, 45 y.o.   MRN: [id number] Subjective:  Latoya Juarez is a 45 y.o. female with hypertension. Patient reports that she has not taken her blood pressure medication since yesterday. She checks her blood pressure nightly at work and it is usually >160/90. She has not smoked cigarettes in 3 weeks. She does admit to drinking several oz of alcohol per day. She does not follow a low sodium diet and she does not exercise.  Patient reports that she believes that she has had the tip of a Q-tip in her left ear for over one year. She states that when it first happened she went to the Urgent care who attempted to get the tip out but pushed it back in further. She was referred to a ENT but did not have money to do. She now has pain in her ear, face, and head which she believes is from the object in her ear.    Current Outpatient Prescriptions  Medication Sig Dispense Refill  . lisinopril (PRINIVIL,ZESTRIL) 10 MG tablet Take 1 tablet (10 mg total) by mouth daily. 30 tablet 1  . ferrous sulfate 325 (65 FE) MG tablet Take 1 tablet (325 mg total) by mouth daily. (Patient not taking: Reported on 11/27/2015) 30 tablet 0  . fluticasone (FLONASE) 50 MCG/ACT nasal spray Place 2 sprays into both nostrils daily. (Patient not taking: Reported on 11/27/2015) 16 g 0  . traMADol (ULTRAM) 50 MG tablet Take 1 tablet (50 mg total) by mouth every 6 (six) hours as needed. (Patient not taking: Reported on 11/27/2015) 15 tablet 0   No current facility-administered medications for this visit.    Hypertension ROS: taking medications as instructed, no medication side effects noted, no TIA's, no chest pain on exertion, no dyspnea on exertion, no swelling of ankles and no palpitations.   Objective:  BP 160/94 mmHg  Pulse 87  Temp(Src) 98 F (36.7 C)  Resp 17  Ht 5\' 1"  (1.549 m)  Wt 258 lb (117.028 kg)  BMI 48.77 kg/m2  SpO2 100%  LMP 11/26/2015  Appearance alert, well appearing, and  in no distress, oriented to person, place, and time and overweight. General exam BP noted to be mildly elevated today in office, S1, S2 normal, no gallop, no murmur, chest clear, no JVD, no HSM, no edema. No foreign object visualized on exam in either ear. Left ear slightly erythematous.  Lab review: orders written for new lab studies as appropriate; see orders.   Assessment:   Frica was seen today for follow-up.  Diagnoses and all orders for this visit:  Essential hypertension Patient wants to try to stop drinking this week and exercise some to see if she can get her blood pressure down. I have explained that when she returns in 2 weeks for a physical I will check her BP and if it is elevated I will increase her Lisinopril to 20 mg.  DASH diet, alcohol cessation, weight loss advised.  Ear foreign body, right, subsequent encounter  Patient has been referred to ENT but was denied due to lack of insurance. She will apply for [facility] discount, when approved I will replace order. I have went over proper way to clean ears without inserting Q-tip.    Return in about 2 weeks (around 12/26/2015) for physical/pap.   Lance Bosch, NP 12/12/2015 9:58 AM

## 2015-12-25 ENCOUNTER — Telehealth: Payer: Self-pay | Admitting: Internal Medicine

## 2015-12-25 MED ORDER — LISINOPRIL 10 MG PO TABS: 10.0000 mg | ORAL_TABLET | Freq: Every day | ORAL | Status: DC

## 2015-12-25 MED FILL — LISINOPRIL 10 MG TABLET: 10 | 30 days supply | Qty: 30 | Fill #0

## 2015-12-25 NOTE — Telephone Encounter (Signed)
Patient called requesting a medication refill for Lisinopril. Please follow up

## 2015-12-25 NOTE — Telephone Encounter (Signed)
Called patient back at [phone] Patient is aware her prescription has [facility] ref[facility]

## 2015-12-26 ENCOUNTER — Encounter: Payer: Self-pay | Admitting: Internal Medicine

## 2016-01-02 ENCOUNTER — Encounter: Payer: Self-pay | Admitting: Internal Medicine

## 2016-01-07 ENCOUNTER — Encounter: Payer: Self-pay | Admitting: Internal Medicine

## 2016-01-17 ENCOUNTER — Encounter: Payer: Self-pay | Admitting: Internal Medicine

## 2016-01-25 ENCOUNTER — Encounter: Payer: Self-pay | Admitting: Internal Medicine

## 2016-01-25 ENCOUNTER — Ambulatory Visit: Payer: Self-pay | Attending: Internal Medicine | Admitting: Internal Medicine

## 2016-01-25 VITALS — BP 144/86 | HR 80 | Temp 97.9°F | Resp 16 | Ht 61.0 in | Wt 257.4 lb

## 2016-01-25 DIAGNOSIS — I10 Essential (primary) hypertension: Secondary | ICD-10-CM | POA: Insufficient documentation

## 2016-01-25 DIAGNOSIS — Z79899 Other long term (current) drug therapy: Secondary | ICD-10-CM | POA: Insufficient documentation

## 2016-01-25 DIAGNOSIS — Z124 Encounter for screening for malignant neoplasm of cervix: Secondary | ICD-10-CM

## 2016-01-25 DIAGNOSIS — Z1239 Encounter for other screening for malignant neoplasm of breast: Secondary | ICD-10-CM

## 2016-01-25 DIAGNOSIS — M6740 Ganglion, unspecified site: Secondary | ICD-10-CM | POA: Insufficient documentation

## 2016-01-25 DIAGNOSIS — Z0001 Encounter for general adult medical examination with abnormal findings: Secondary | ICD-10-CM | POA: Insufficient documentation

## 2016-01-25 DIAGNOSIS — E669 Obesity, unspecified: Secondary | ICD-10-CM | POA: Insufficient documentation

## 2016-01-25 DIAGNOSIS — Z0000 Encounter for general adult medical examination without abnormal findings: Secondary | ICD-10-CM

## 2016-01-25 NOTE — Progress Notes (Signed)
Patient ID: Latoya Juarez, female   DOB: Jun 17, 1971, 45 y.o.   MRN: [id number]  CC: pap   HPI: Latoya Juarez is a 45 y.o. female here today for a follow up visit.  Patient has past medical history of hypertension, abnormal cytology on pap, and obesity. Patient reports that she has never had a mammogram. She currently has a tenderness in her left breast that started 2 days ago. Her LMP was 1 week ago. She has not felt any unusual lumps in the breast and denies a family history of breast cancer. Patient denies any vaginal discharge, itch, or odor.  Patient reports that she has had two "knots" on her left finger for the past several years but it has recently grown and become slightly tender to touch.   No Known Allergies Past Medical History  Diagnosis Date  . Obesity    Current Outpatient Prescriptions on File Prior to Visit  Medication Sig Dispense Refill  . lisinopril (PRINIVIL,ZESTRIL) 10 MG tablet Take 1 tablet (10 mg total) by mouth daily. 30 tablet 1   No current facility-administered medications on file prior to visit.   History reviewed. No pertinent family history. Social History   Social History  . Marital Status: Legally Separated    Spouse Name: N/A  . Number of Children: N/A  . Years of Education: N/A   Occupational History  . Not on file.   Social History Main Topics  . Smoking status: Current Every Day Smoker  . Smokeless tobacco: Not on file  . Alcohol Use: Yes     Comment: socially  . Drug Use: No  . Sexual Activity: Not on file   Other Topics Concern  . Not on file   Social History Narrative    Review of Systems: Constitutional: Negative for fever, chills, diaphoresis, activity change, appetite change and fatigue. HENT: Negative for ear pain, nosebleeds, congestion, facial swelling, rhinorrhea, neck pain, neck stiffness and ear discharge.  Eyes: Negative for pain, discharge, redness, itching and visual disturbance. Respiratory: Negative for cough,  choking, chest tightness, shortness of breath, wheezing and stridor.  Cardiovascular: Negative for chest pain, palpitations and leg swelling. Gastrointestinal: Negative for abdominal distention. Genitourinary: Negative for dysuria, urgency, frequency, hematuria, flank pain, decreased urine volume, difficulty urinating and dyspareunia.  Musculoskeletal: Negative for back pain, joint swelling, arthralgias and gait problem. Neurological: Negative for dizziness, tremors, seizures, syncope, facial asymmetry, speech difficulty, weakness, light-headedness, numbness and headaches.  Hematological: Negative for adenopathy. Does not bruise/bleed easily. Psychiatric/Behavioral: Negative for hallucinations, behavioral problems, confusion, dysphoric mood, decreased concentration and agitation.    Objective:   Filed Vitals:   01/25/16 1116  BP: 144/86  Pulse: 80  Temp: 97.9 F (36.6 C)  Resp: 16    Physical Exam: Constitutional: Patient appears well-developed and well-nourished. No distress. HENT: Normocephalic, atraumatic, External right and left ear normal. Oropharynx is clear and moist.  Eyes: Conjunctivae and EOM are normal. PERRLA, no scleral icterus. Neck: Normal ROM. Neck supple. No JVD. No tracheal deviation. No thyromegaly. CVS: RRR, S1/S2 +, no murmurs, no gallops, no carotid bruit.  Pulmonary: Effort and breath sounds normal, no stridor, rhonchi, wheezes, rales.  Abdominal: Soft. BS +,  no distension, tenderness, rebound or guarding.  Musculoskeletal: Normal range of motion. No edema and no tenderness.  Lymphadenopathy: No lymphadenopathy noted, cervical, inguinal or axillary Neuro: Alert. Normal reflexes, muscle tone coordination. No cranial nerve deficit. Skin: Skin is warm and dry. No rash noted. Not diaphoretic. No erythema. No pallor. Psychiatric:  Normal mood and affect. Behavior, judgment, thought content normal. Genitalia: Normal female without lesion, discharge or tenderness,  NSSA, NT, no adnexal masses felt on exam  Breast: No tenderness, masses, or nipple abnormality     Lab Results  Component Value Date   WBC 7.7 06/11/2014   HGB 7.8* 06/11/2014   HCT 25.8* 06/11/2014   MCV 67.7* 06/11/2014   PLT 378 06/11/2014   Lab Results  Component Value Date   CREATININE 0.62 06/11/2014   BUN 6 06/11/2014   NA 138 06/11/2014   K 3.9 06/11/2014   CL 103 06/11/2014   CO2 24 06/11/2014    No results found for: HGBA1C Lipid Panel  No results found for: CHOL, TRIG, HDL, CHOLHDL, VLDL, LDLCALC     Assessment and plan:   Latoya Juarez was seen today for gynecologic exam.  Diagnoses and all orders for this visit:  Annual physical exam -     CBC with Differential; Future -     COMPLETE METABOLIC PANEL WITH GFR; Future -     Lipid panel; Future -     Vitamin D, 25-hydroxy; Future  Papanicolaou smear -     Cytology - PAP [place]  Breast cancer screening -     MM Digital Screening; Future No abnormalities on exam. I have given her the number to the [facility] program so they can assist with affording mammogram.   Ganglion cyst Explained that most resolve on their own but since it is growing she may need needle aspiration. She will require Ortho for aspiration but she is uninsured. She will apply for [facility] discount and a referral may be placed.      Return if symptoms worsen or fail to improve.   Lance Bosch, [place] [facility] [phone] 01/25/2016, 2:08 PM

## 2016-01-25 NOTE — Patient Instructions (Signed)
Please call Sabrina Holland, [phone],  with the BCCCP (breast and cervical cancer control program) at the [facility] to set up an appointment to verify eligibility for a breast exam, mammogram, ultrasound. If you qualify this will be set up at [facility].   

## 2016-01-25 NOTE — Progress Notes (Signed)
Patient's here for Pap.  Patient concern with some tenderness in L breast. She states it started x2days ago.

## 2016-01-28 LAB — CYTOLOGY - PAP

## 2016-01-30 LAB — CERVICOVAGINAL ANCILLARY ONLY
Chlamydia: NEGATIVE
Neisseria Gonorrhea: NEGATIVE
Trichomonas: NEGATIVE

## 2016-01-31 ENCOUNTER — Telehealth: Payer: Self-pay

## 2016-01-31 DIAGNOSIS — Z87891 Personal history of nicotine dependence: Secondary | ICD-10-CM | POA: Insufficient documentation

## 2016-01-31 DIAGNOSIS — Z789 Other specified health status: Secondary | ICD-10-CM | POA: Insufficient documentation

## 2016-01-31 DIAGNOSIS — E669 Obesity, unspecified: Secondary | ICD-10-CM | POA: Insufficient documentation

## 2016-01-31 DIAGNOSIS — L299 Pruritus, unspecified: Secondary | ICD-10-CM | POA: Insufficient documentation

## 2016-01-31 MED FILL — MOMETASONE FUROATE 0.1% SOL: 0.1 | 30 days supply | Qty: 30 | Fill #0

## 2016-01-31 NOTE — Telephone Encounter (Signed)
Patient verified name and DOB. Patient was given labs and pap results verbalized understanding with no further questions.

## 2016-01-31 NOTE — Telephone Encounter (Signed)
-----   Message from Lance Bosch, NP sent at 01/30/2016  7:54 PM EDT ----- Normal labs and cytology. Repeat pap in 3 years

## 2016-02-01 LAB — CERVICOVAGINAL ANCILLARY ONLY: CANDIDA VAGINITIS: NEGATIVE

## 2016-02-07 ENCOUNTER — Telehealth: Payer: Self-pay | Admitting: Physician Assistant

## 2016-02-07 MED ORDER — METRONIDAZOLE 500 MG PO TABS: 500.0000 mg | ORAL_TABLET | Freq: Two times a day (BID) | ORAL | Status: DC

## 2016-02-07 MED FILL — metroNIDAZOLE 500 MG TABS: 500 | 7 days supply | Qty: 14 | Fill #0

## 2016-02-07 NOTE — Telephone Encounter (Signed)
I spoke with Latoya Juarez and gave her the results of her wet prep being +BV.  I sent an Rx of metronidazole to the pharmacy.

## 2016-02-11 MED FILL — LISINOPRIL 10 MG TABLET: 10 | 30 days supply | Qty: 30 | Fill #1

## 2016-02-13 ENCOUNTER — Ambulatory Visit: Payer: Self-pay | Admitting: Pharmacist

## 2016-02-19 ENCOUNTER — Encounter (HOSPITAL_COMMUNITY): Payer: Self-pay

## 2016-02-19 ENCOUNTER — Emergency Department (HOSPITAL_COMMUNITY)
Admission: EM | Admit: 2016-02-19 | Discharge: 2016-02-19 | Disposition: A | Discharge status: Home / Self Care | Payer: Self-pay | Attending: Emergency Medicine | Admitting: Emergency Medicine

## 2016-02-19 DIAGNOSIS — L0201 Cutaneous abscess of face: Secondary | ICD-10-CM | POA: Insufficient documentation

## 2016-02-19 DIAGNOSIS — E669 Obesity, unspecified: Secondary | ICD-10-CM | POA: Insufficient documentation

## 2016-02-19 DIAGNOSIS — Z792 Long term (current) use of antibiotics: Secondary | ICD-10-CM | POA: Insufficient documentation

## 2016-02-19 DIAGNOSIS — F17200 Nicotine dependence, unspecified, uncomplicated: Secondary | ICD-10-CM | POA: Insufficient documentation

## 2016-02-19 DIAGNOSIS — Z79899 Other long term (current) drug therapy: Secondary | ICD-10-CM | POA: Insufficient documentation

## 2016-02-19 DIAGNOSIS — L0291 Cutaneous abscess, unspecified: Secondary | ICD-10-CM

## 2016-02-19 LAB — CBG MONITORING, ED: Glucose-Capillary: 114 mg/dL — ABNORMAL HIGH (ref 65–99)

## 2016-02-19 MED ORDER — CEPHALEXIN 500 MG PO CAPS: 500.0000 mg | ORAL_CAPSULE | Freq: Four times a day (QID) | ORAL | Status: DC

## 2016-02-19 MED ORDER — SULFAMETHOXAZOLE-TRIMETHOPRIM 800-160 MG PO TABS: 1.0000 | ORAL_TABLET | Freq: Two times a day (BID) | ORAL | Status: DC

## 2016-02-19 MED FILL — CEPHALEXIN 500 MG CAPSULE: 500 | 7 days supply | Qty: 28 | Fill #0

## 2016-02-19 MED FILL — SULFAMETHOXAZOLE-TMP DS TAB: 800-160 | 7 days supply | Qty: 14 | Fill #0

## 2016-02-19 NOTE — Discharge Instructions (Signed)

## 2016-02-19 NOTE — ED Notes (Signed)
Patient here with left ear redness and swelling x 2 days.

## 2016-02-19 NOTE — ED Notes (Signed)
Pt verbalized understanding of d/c instructions and follow-up care. No further questions/concerns, VSS, ambulatory w/ steady gait (refused wheelchair) 

## 2016-02-19 NOTE — ED Provider Notes (Signed)
CSN: [id number]     Arrival date & time 02/19/16  [id number] History   First MD Initiated Contact with Patient 02/19/16 0940     Chief Complaint  Patient presents with  . ear redness      (Consider location/radiation/quality/duration/timing/severity/associated sxs/prior Treatment) HPI Patient presents to the [facility] ED with symptoms of left ear pain and swelling. Symptoms started about 2 days ago and have gradually worsened. She reports no trauma or irritation. No new jewelry associated with symptoms. She has not tried anything to treat symptoms. No associated headache, nausea, vomiting, vision changes. No rhinorrhea, sneezing of coughing. No ear discharge.   Past Medical History  Diagnosis Date  . Obesity    Past Surgical History  Procedure Laterality Date  . Cesarean section     No family history on file. Social History  Substance Use Topics  . Smoking status: Current Every Day Smoker  . Smokeless tobacco: None  . Alcohol Use: Yes     Comment: socially   OB History    No data available     Review of Systems  Constitutional: Negative for fever and chills.  HENT: Positive for ear pain. Negative for ear discharge, hearing loss, rhinorrhea, sneezing and sore throat.   Eyes: Negative for discharge.  Respiratory: Negative for cough.   All other systems reviewed and are negative.      Allergies  Review of patient's allergies indicates no known allergies.  Home Medications   Prior to Admission medications   Medication Sig Start Date End Date Taking? Authorizing Provider  lisinopril (PRINIVIL,ZESTRIL) 10 MG tablet Take 1 tablet (10 mg total) by mouth daily. 12/25/15   Lance Bosch, NP  metroNIDAZOLE (FLAGYL) 500 MG tablet Take 1 tablet (500 mg total) by mouth 2 (two) times daily. Do not drink alcohol while on this medication 02/07/16   Argentina Donovan, PA-C   BP 146/81 mmHg  Pulse 80  Temp(Src) 98.2 F (36.8 C) (Oral)  Resp 16  Ht 5\' 1"  (1.549 m)  Wt 115.214 kg  BMI  48.02 kg/m2  SpO2 99%  LMP 02/11/2016 Physical Exam  Constitutional: She appears well-developed and well-nourished.  HENT:  Left Ear: External ear normal.  Eyes: Conjunctivae and EOM are normal. Pupils are equal, round, and reactive to light. Right eye exhibits no discharge.  Neck: Normal range of motion. Neck supple.  Cardiovascular: Normal rate and regular rhythm.   No murmur heard. Lymphadenopathy:    She has no cervical adenopathy.  Skin: Skin is warm and dry.  Area of induration below left lobe of auricle. The area is erythematous with minimal fluctuance and some tenderness. Area measures 1.5cm x 2cm.    ED Course  Procedures (including critical care time) Labs Review Labs Reviewed - No data to display  Imaging Review No results found. I have personally reviewed and evaluated these images and lab results as part of my medical decision-making.   EKG Interpretation None      MDM   Final diagnoses:  Abscess    No significant fluctuance. Discussed management with patient. Recommended initial conservative treatment secondary to location and lack of significant fluctuance. Warm compress, analgesics and Bactrim DS and Keflex for 7 days. Follow-up with ENT in 48 hours for possible need for I&D of lesion. Patient afebrile with stable vitals. Stable for discharge home.    Mariel Aloe, MD 02/19/16 [place], MD 02/19/16 [id number]

## 2016-02-26 ENCOUNTER — Telehealth: Payer: Self-pay | Admitting: *Deleted

## 2016-02-26 NOTE — Telephone Encounter (Signed)
-----   Message from Tresa Garter, MD sent at 02/13/2016 10:30 AM EDT ----- Lenard Forth mount showed positive bacterial vaginosis, not a sexually transmitted disease. Metronidazole called to the pharmacy for pickup.

## 2016-02-26 NOTE — Telephone Encounter (Signed)
Medical Assistant left message on patient's home and cell voicemail. Voicemail states to give a call back to Latoya Juarez with [facility] at [phone].  

## 2016-02-28 ENCOUNTER — Other Ambulatory Visit: Payer: Self-pay | Admitting: Internal Medicine

## 2016-02-28 DIAGNOSIS — Z1231 Encounter for screening mammogram for malignant neoplasm of breast: Secondary | ICD-10-CM

## 2016-03-18 MED FILL — LISINOPRIL 10 MG TABLET: 10 | 30 days supply | Qty: 30 | Fill #1

## 2016-03-25 NOTE — Telephone Encounter (Signed)
Patient verified DOB Patient is aware of being BV positive. Patient states she picked up the medication and has completed the course. Patient denies any concerns or symptoms at this time. No further questions.

## 2016-03-25 NOTE — Telephone Encounter (Signed)
-----   Message from Tresa Garter, MD sent at 02/13/2016 10:30 AM EDT ----- Lenard Forth mount showed positive bacterial vaginosis, not a sexually transmitted disease. Metronidazole called to the pharmacy for pickup.

## 2016-04-23 ENCOUNTER — Other Ambulatory Visit: Payer: Self-pay | Admitting: Internal Medicine

## 2016-05-08 MED FILL — ?LISINOPRIL 10 MG TABLET: 10 | 30 days supply | Qty: 30 | Fill #0

## 2016-06-17 ENCOUNTER — Ambulatory Visit: Payer: Self-pay | Attending: Internal Medicine | Admitting: Internal Medicine

## 2016-06-17 ENCOUNTER — Encounter: Payer: Self-pay | Admitting: Internal Medicine

## 2016-06-17 VITALS — BP 152/94 | HR 81 | Temp 98.4°F | Resp 16 | Wt 252.6 lb

## 2016-06-17 DIAGNOSIS — M542 Cervicalgia: Secondary | ICD-10-CM | POA: Insufficient documentation

## 2016-06-17 DIAGNOSIS — F5089 Other specified eating disorder: Secondary | ICD-10-CM

## 2016-06-17 DIAGNOSIS — I10 Essential (primary) hypertension: Secondary | ICD-10-CM | POA: Insufficient documentation

## 2016-06-17 DIAGNOSIS — Z1329 Encounter for screening for other suspected endocrine disorder: Secondary | ICD-10-CM

## 2016-06-17 DIAGNOSIS — R51 Headache: Secondary | ICD-10-CM | POA: Insufficient documentation

## 2016-06-17 DIAGNOSIS — Z131 Encounter for screening for diabetes mellitus: Secondary | ICD-10-CM

## 2016-06-17 DIAGNOSIS — D509 Iron deficiency anemia, unspecified: Secondary | ICD-10-CM | POA: Insufficient documentation

## 2016-06-17 DIAGNOSIS — Z720 Tobacco use: Secondary | ICD-10-CM

## 2016-06-17 DIAGNOSIS — N920 Excessive and frequent menstruation with regular cycle: Secondary | ICD-10-CM | POA: Insufficient documentation

## 2016-06-17 DIAGNOSIS — F17200 Nicotine dependence, unspecified, uncomplicated: Secondary | ICD-10-CM | POA: Insufficient documentation

## 2016-06-17 DIAGNOSIS — H9201 Otalgia, right ear: Secondary | ICD-10-CM | POA: Insufficient documentation

## 2016-06-17 DIAGNOSIS — Z114 Encounter for screening for human immunodeficiency virus [HIV]: Secondary | ICD-10-CM

## 2016-06-17 LAB — CBC WITH DIFFERENTIAL/PLATELET
BASOS ABS: 83 {cells}/uL (ref 0–200)
Basophils Relative: 1 %
EOS PCT: 2 %
Eosinophils Absolute: 166 cells/uL (ref 15–500)
HEMATOCRIT: 29.3 % — AB (ref 35.0–45.0)
Hemoglobin: 8.4 g/dL — ABNORMAL LOW (ref 11.7–15.5)
Lymphocytes Relative: 26 %
Lymphs Abs: 2158 cells/uL (ref 850–3900)
MCH: 17.7 pg — ABNORMAL LOW (ref 27.0–33.0)
MCHC: 28.7 g/dL — AB (ref 32.0–36.0)
MCV: 61.8 fL — AB (ref 80.0–100.0)
MONOS PCT: 8 %
MPV: 9.8 fL (ref 7.5–12.5)
Monocytes Absolute: 664 cells/uL (ref 200–950)
NEUTROS ABS: 5229 {cells}/uL (ref 1500–7800)
Neutrophils Relative %: 63 %
Platelets: 592 10*3/uL — ABNORMAL HIGH (ref 140–400)
RBC: 4.74 MIL/uL (ref 3.80–5.10)
RDW: 19.1 % — AB (ref 11.0–15.0)
WBC: 8.3 10*3/uL (ref 3.8–10.8)

## 2016-06-17 LAB — TSH: TSH: 2.11 mIU/L

## 2016-06-17 LAB — POCT GLYCOSYLATED HEMOGLOBIN (HGB A1C): Hemoglobin A1C: 6

## 2016-06-17 MED ORDER — LISINOPRIL-HYDROCHLOROTHIAZIDE 20-25 MG PO TABS: 1.0000 | ORAL_TABLET | Freq: Every day | ORAL | 3 refills | Status: DC

## 2016-06-17 NOTE — Patient Instructions (Signed)
DASH Eating Plan DASH stands for "Dietary Approaches to Stop Hypertension." The DASH eating plan is a healthy eating plan that has been shown to reduce high blood pressure (hypertension). Additional health benefits may include reducing the risk of type 2 diabetes mellitus, heart disease, and stroke. The DASH eating plan may also help with weight loss. WHAT DO I NEED TO KNOW ABOUT THE DASH EATING PLAN? For the DASH eating plan, you will follow these general guidelines:  Choose foods with a percent daily value for sodium of less than 5% (as listed on the food label).  Use salt-free seasonings or herbs instead of table salt or sea salt.  Check with your health care provider or pharmacist before using salt substitutes.  Eat lower-sodium products, often labeled as "lower sodium" or "no salt added."  Eat fresh foods.  Eat more vegetables, fruits, and low-fat dairy products.  Choose whole grains. Look for the word "whole" as the first word in the ingredient list.  Choose fish and skinless chicken or turkey more often than red meat. Limit fish, poultry, and meat to 6 oz (170 g) each day.  Limit sweets, desserts, sugars, and sugary drinks.  Choose heart-healthy fats.  Limit cheese to 1 oz (28 g) per day.  Eat more home-cooked food and less restaurant, buffet, and fast food.  Limit fried foods.  Cook foods using methods other than frying.  Limit canned vegetables. If you do use them, rinse them well to decrease the sodium.  When eating at a restaurant, ask that your food be prepared with less salt, or no salt if possible. WHAT FOODS CAN I EAT? Seek help from a dietitian for individual calorie needs. Grains Whole grain or whole wheat bread. Brown rice. Whole grain or whole wheat pasta. Quinoa, bulgur, and whole grain cereals. Low-sodium cereals. Corn or whole wheat flour tortillas. Whole grain cornbread. Whole grain crackers. Low-sodium crackers. Vegetables Fresh or frozen vegetables  (raw, steamed, roasted, or grilled). Low-sodium or reduced-sodium tomato and vegetable juices. Low-sodium or reduced-sodium tomato sauce and paste. Low-sodium or reduced-sodium canned vegetables.  Fruits All fresh, canned (in natural juice), or frozen fruits. Meat and Other Protein Products Ground beef (85% or leaner), grass-fed beef, or beef trimmed of fat. Skinless chicken or turkey. Ground chicken or turkey. Pork trimmed of fat. All fish and seafood. Eggs. Dried beans, peas, or lentils. Unsalted nuts and seeds. Unsalted canned beans. Dairy Low-fat dairy products, such as skim or 1% milk, 2% or reduced-fat cheeses, low-fat ricotta or cottage cheese, or plain low-fat yogurt. Low-sodium or reduced-sodium cheeses. Fats and Oils Tub margarines without trans fats. Light or reduced-fat mayonnaise and salad dressings (reduced sodium). Avocado. Safflower, olive, or canola oils. Natural peanut or almond butter. Other Unsalted popcorn and pretzels. The items listed above may not be a complete list of recommended foods or beverages. Contact your dietitian for more options. WHAT FOODS ARE NOT RECOMMENDED? Grains White bread. White pasta. White rice. Refined cornbread. Bagels and croissants. Crackers that contain trans fat. Vegetables Creamed or fried vegetables. Vegetables in a cheese sauce. Regular canned vegetables. Regular canned tomato sauce and paste. Regular tomato and vegetable juices. Fruits Dried fruits. Canned fruit in light or heavy syrup. Fruit juice. Meat and Other Protein Products Fatty cuts of meat. Ribs, chicken wings, bacon, sausage, bologna, salami, chitterlings, fatback, hot dogs, bratwurst, and packaged luncheon meats. Salted nuts and seeds. Canned beans with salt. Dairy Whole or 2% milk, cream, half-and-half, and cream cheese. Whole-fat or sweetened yogurt. Full-fat   cheeses or blue cheese. Nondairy creamers and whipped toppings. Processed cheese, cheese spreads, or cheese  curds. Condiments Onion and garlic salt, seasoned salt, table salt, and sea salt. Canned and packaged gravies. Worcestershire sauce. Tartar sauce. Barbecue sauce. Teriyaki sauce. Soy sauce, including reduced sodium. Steak sauce. Fish sauce. Oyster sauce. Cocktail sauce. Horseradish. Ketchup and mustard. Meat flavorings and tenderizers. Bouillon cubes. Hot sauce. Tabasco sauce. Marinades. Taco seasonings. Relishes. Fats and Oils Butter, stick margarine, lard, shortening, ghee, and bacon fat. Coconut, palm kernel, or palm oils. Regular salad dressings. Other Pickles and olives. Salted popcorn and pretzels. The items listed above may not be a complete list of foods and beverages to avoid. Contact your dietitian for more information. WHERE CAN I FIND MORE INFORMATION? National Heart, Lung, and [employer] Institute: travelstabloid.com   This information is not intended to replace advice given to you by your health care provider. Make sure you discuss any questions you have with your health care provider.   Document Released: 09/25/2011 Document Revised: 10/27/2014 Document Reviewed: 08/10/2013 Elsevier Interactive Patient Education 2016 [employer] Inc[employer]   -  Low-Sodium Eating Plan Sodium raises blood pressure and causes water to be held in the body. Getting less sodium from food will help lower your blood pressure, reduce any swelling, and protect your heart, liver, and kidneys. We get sodium by adding salt (sodium chloride) to food. Most of our sodium comes from canned, boxed, and frozen foods. Restaurant foods, fast foods, and pizza are also very high in sodium. Even if you take medicine to lower your blood pressure or to reduce fluid in your body, getting less sodium from your food is important. WHAT IS MY PLAN? Most people should limit their sodium intake to 2,300 mg a day. Your health care provider recommends that you limit your sodium intake to 2 gram a day.  WHAT DO  I NEED TO KNOW ABOUT THIS EATING PLAN? For the low-sodium eating plan, you will follow these general guidelines:  Choose foods with a % Daily Value for sodium of less than 5% (as listed on the food label).   Use salt-free seasonings or herbs instead of table salt or sea salt.   Check with your health care provider or pharmacist before using salt substitutes.   Eat fresh foods.  Eat more vegetables and fruits.  Limit canned vegetables. If you do use them, rinse them well to decrease the sodium.   Limit cheese to 1 oz (28 g) per day.   Eat lower-sodium products, often labeled as "lower sodium" or "no salt added."  Avoid foods that contain monosodium glutamate (MSG). MSG is sometimes added to [place] food and some canned foods.  Check food labels (Nutrition Facts labels) on foods to learn how much sodium is in one serving.  Eat more home-cooked food and less restaurant, buffet, and fast food.  When eating at a restaurant, ask that your food be prepared with less salt, or no salt if possible.  HOW DO I READ FOOD LABELS FOR SODIUM INFORMATION? The Nutrition Facts label lists the amount of sodium in one serving of the food. If you eat more than one serving, you must multiply the listed amount of sodium by the number of servings. Food labels may also identify foods as:  Sodium free--Less than 5 mg in a serving.  Very low sodium--35 mg or less in a serving.  Low sodium--140 mg or less in a serving.  Light in sodium--50% less sodium in a serving. For example, if  a food that usually has 300 mg of sodium is changed to become light in sodium, it will have 150 mg of sodium.  Reduced sodium--25% less sodium in a serving. For example, if a food that usually has 400 mg of sodium is changed to reduced sodium, it will have 300 mg of sodium. WHAT FOODS CAN I EAT? Grains Low-sodium cereals, including oats, puffed wheat and rice, and shredded wheat cereals. Low-sodium crackers.  Unsalted rice and pasta. Lower-sodium bread.  Vegetables Frozen or fresh vegetables. Low-sodium or reduced-sodium canned vegetables. Low-sodium or reduced-sodium tomato sauce and paste. Low-sodium or reduced-sodium tomato and vegetable juices.  Fruits Fresh, frozen, and canned fruit. Fruit juice.  Meat and Other Protein Products Low-sodium canned tuna and salmon. Fresh or frozen meat, poultry, seafood, and fish. Lamb. Unsalted nuts. Dried beans, peas, and lentils without added salt. Unsalted canned beans. Homemade soups without salt. Eggs.  Dairy Milk. Soy milk. Ricotta cheese. Low-sodium or reduced-sodium cheeses. Yogurt.  Condiments Fresh and dried herbs and spices. Salt-free seasonings. Onion and garlic powders. Low-sodium varieties of mustard and ketchup. Fresh or refrigerated horseradish. Lemon juice.  Fats and Oils Reduced-sodium salad dressings. Unsalted butter.  Other Unsalted popcorn and pretzels.  The items listed above may not be a complete list of recommended foods or beverages. Contact your dietitian for more options. WHAT FOODS ARE NOT RECOMMENDED? Grains Instant hot cereals. Bread stuffing, pancake, and biscuit mixes. Croutons. Seasoned rice or pasta mixes. Noodle soup cups. Boxed or frozen macaroni and cheese. Self-rising flour. Regular salted crackers. Vegetables Regular canned vegetables. Regular canned tomato sauce and paste. Regular tomato and vegetable juices. Frozen vegetables in sauces. Salted [place] fries. Olives. Angie Fava. Relishes. Sauerkraut. Salsa. Meat and Other Protein Products Salted, canned, smoked, spiced, or pickled meats, seafood, or fish. Bacon, ham, sausage, hot dogs, corned beef, chipped beef, and packaged luncheon meats. Salt pork. Jerky. Pickled herring. Anchovies, regular canned tuna, and sardines. Salted nuts. Dairy Processed cheese and cheese spreads. Cheese curds. Blue cheese and cottage cheese. Buttermilk.  Condiments Onion and  garlic salt, seasoned salt, table salt, and sea salt. Canned and packaged gravies. Worcestershire sauce. Tartar sauce. Barbecue sauce. Teriyaki sauce. Soy sauce, including reduced sodium. Steak sauce. Fish sauce. Oyster sauce. Cocktail sauce. Horseradish that you find on the shelf. Regular ketchup and mustard. Meat flavorings and tenderizers. Bouillon cubes. Hot sauce. Tabasco sauce. Marinades. Taco seasonings. Relishes. Fats and Oils Regular salad dressings. Salted butter. Margarine. Ghee. Bacon fat.  Other Potato and tortilla chips. Corn chips and puffs. Salted popcorn and pretzels. Canned or dried soups. Pizza. Frozen entrees and pot pies.  The items listed above may not be a complete list of foods and beverages to avoid. Contact your dietitian for more information.   This information is not intended to replace advice given to you by your health care provider. Make sure you discuss any questions you have with your health care provider.   Document Released: 03/28/2002 Document Revised: 10/27/2014 Document Reviewed: 08/10/2013 Elsevier Interactive Patient Education 2016 [place] Can Quit Smoking If you are ready to quit smoking or are thinking about it, congratulations! You have chosen to help yourself be healthier and live longer! There are lots of different ways to quit smoking. Nicotine gum, nicotine patches, a nicotine inhaler, or nicotine nasal spray can help with physical craving. Hypnosis, support groups, and medicines help break the habit of smoking. TIPS TO GET OFF AND STAY OFF CIGARETTES  Learn to predict your moods. Do  not let a bad situation be your excuse to have a cigarette. Some situations in your life might tempt you to have a cigarette.  Ask friends and co-workers not to smoke around you.  Make your home smoke-free.  Never have "just one" cigarette. It leads to wanting another and another. Remind yourself of your decision to quit.  On a card, make a list  of your reasons for not smoking. Read it at least the same number of times a day as you have a cigarette. Tell yourself everyday, "I do not want to smoke. I choose not to smoke."  Ask someone at home or work to help you with your plan to quit smoking.  Have something planned after you eat or have a cup of coffee. Take a walk or get other exercise to perk you up. This will help to keep you from overeating.  Try a relaxation exercise to calm you down and decrease your stress. Remember, you may be tense and nervous the first two weeks after you quit. This will pass.  Find new activities to keep your hands busy. Play with a pen, coin, or rubber band. Doodle or draw things on paper.  Brush your teeth right after eating. This will help cut down the craving for the taste of tobacco after meals. You can try mouthwash too.  Try gum, breath mints, or diet candy to keep something in your mouth. IF YOU SMOKE AND WANT TO QUIT:  Do not stock up on cigarettes. Never buy a carton. Wait until one pack is finished before you buy another.  Never carry cigarettes with you at work or at home.  Keep cigarettes as far away from you as possible. Leave them with someone else.  Never carry matches or a lighter with you.  Ask yourself, "Do I need this cigarette or is this just a reflex?"  Bet with someone that you can quit. Put cigarette money in a piggy bank every morning. If you smoke, you give up the money. If you do not smoke, by the end of the week, you keep the money.  Keep trying. It takes 21 days to change a habit!  Talk to your doctor about using medicines to help you quit. These include nicotine replacement gum, lozenges, or skin patches.   This information is not intended to replace advice given to you by your health care provider. Make sure you discuss any questions you have with your health care provider.   Document Released: 08/02/2009 Document Revised: 12/29/2011 Document Reviewed:  08/02/2009 Elsevier Interactive Patient Education 2016 [place] [employer] Calories are energy you get from the things you eat and drink. Your body uses this energy to keep you going throughout the day. The number of calories you eat affects your weight. When you eat more calories than your body needs, your body stores the extra calories as fat. When you eat fewer calories than your body needs, your body burns fat to get the energy it needs. Calorie counting means keeping track of how many calories you eat and drink each day. If you make sure to eat fewer calories than your body needs, you should lose weight. In order for calorie counting to work, you will need to eat the number of calories that are right for you in a day to lose a healthy amount of weight per week. A healthy amount of weight to lose per week is usually 1-2 lb (0.5-0.9 kg). A  dietitian can determine how many calories you need in a day and give you suggestions on how to reach your calorie goal.  WHAT IS MY MY PLAN? My goal is to have __________ calories per day.  If I have this many calories per day, I should lose around __________ pounds per week. WHAT DO I NEED TO KNOW ABOUT CALORIE COUNTING? In order to meet your daily calorie goal, you will need to:  Find out how many calories are in each food you would like to eat. Try to do this before you eat.  Decide how much of the food you can eat.  Write down what you ate and how many calories it had. Doing this is called keeping a food log. WHERE DO I FIND CALORIE INFORMATION? The number of calories in a food can be found on a Nutrition Facts label. Note that all the information on a label is based on a specific serving of the food. If a food does not have a Nutrition Facts label, try to look up the calories online or ask your dietitian for help. HOW DO I DECIDE HOW MUCH TO EAT? To decide how much of the food you can eat, you will need to consider both  the number of calories in one serving and the size of one serving. This information can be found on the Nutrition Facts label. If a food does not have a Nutrition Facts label, look up the information online or ask your dietitian for help. Remember that calories are listed per serving. If you choose to have more than one serving of a food, you will have to multiply the calories per serving by the amount of servings you plan to eat. For example, the label on a package of bread might say that a serving size is 1 slice and that there are 90 calories in a serving. If you eat 1 slice, you will have eaten 90 calories. If you eat 2 slices, you will have eaten 180 calories. HOW DO I KEEP A FOOD LOG? After each meal, record the following information in your food log:  What you ate.  How much of it you ate.  How many calories it had.  Then, add up your calories. Keep your food log near you, such as in a small notebook in your pocket. Another option is to use a mobile app or website. Some programs will calculate calories for you and show you how many calories you have left each time you add an item to the log. WHAT ARE SOME CALORIE COUNTING TIPS?  Use your calories on foods and drinks that will fill you up and not leave you hungry. Some examples of this include foods like nuts and nut butters, vegetables, lean proteins, and high-fiber foods (more than 5 g fiber per serving).  Eat nutritious foods and avoid empty calories. Empty calories are calories you get from foods or beverages that do not have many nutrients, such as candy and soda. It is better to have a nutritious high-calorie food (such as an avocado) than a food with few nutrients (such as a bag of chips).  Know how many calories are in the foods you eat most often. This way, you do not have to look up how many calories they have each time you eat them.  Look out for foods that may seem like low-calorie foods but are really high-calorie foods, such  as baked goods, soda, and fat-free candy.  Pay attention to calories in drinks.  Drinks such as sodas, specialty coffee drinks, alcohol, and juices have a lot of calories yet do not fill you up. Choose low-calorie drinks like water and diet drinks.  Focus your calorie counting efforts on higher calorie items. Logging the calories in a garden salad that contains only vegetables is less important than calculating the calories in a milk shake.  Find a way of tracking calories that works for you. Get creative. Most people who are successful find ways to keep track of how much they eat in a day, even if they do not count every calorie. WHAT ARE SOME PORTION CONTROL TIPS?  Know how many calories are in a serving. This will help you know how many servings of a certain food you can have.  Use a measuring cup to measure serving sizes. This is helpful when you start out. With time, you will be able to estimate serving sizes for some foods.  Take some time to put servings of different foods on your favorite plates, bowls, and cups so you know what a serving looks like.  Try not to eat straight from a bag or box. Doing this can lead to overeating. Put the amount you would like to eat in a cup or on a plate to make sure you are eating the right portion.  Use smaller plates, glasses, and bowls to prevent overeating. This is a quick and easy way to practice portion control. If your plate is smaller, less food can fit on it.  Try not to multitask while eating, such as watching TV or using your computer. If it is time to eat, sit down at a table and enjoy your food. Doing this will help you to start recognizing when you are full. It will also make you more aware of what and how much you are eating. HOW CAN I CALORIE COUNT WHEN EATING OUT?  Ask for smaller portion sizes or child-sized portions.  Consider sharing an entree and sides instead of getting your own entree.  If you get your own entree, eat only  half. Ask for a box at the beginning of your meal and put the rest of your entree in it so you are not tempted to eat it.  Look for the calories on the menu. If calories are listed, choose the lower calorie options.  Choose dishes that include vegetables, fruits, whole grains, low-fat dairy products, and lean protein. Focusing on smart food choices from each of the 5 food groups can help you stay on track at restaurants.  Choose items that are boiled, broiled, grilled, or steamed.  Choose water, milk, unsweetened iced tea, or other drinks without added sugars. If you want an alcoholic beverage, choose a lower calorie option. For example, a regular margarita can have up to 700 calories and a glass of wine has around 150.  Stay away from items that are buttered, battered, fried, or served with cream sauce. Items labeled "crispy" are usually fried, unless stated otherwise.  Ask for dressings, sauces, and syrups on the side. These are usually very high in calories, so do not eat much of them.  Watch out for salads. Many people think salads are a healthy option, but this is often not the case. Many salads come with bacon, fried chicken, lots of cheese, fried chips, and dressing. All of these items have a lot of calories. If you want a salad, choose a garden salad and ask for grilled meats or steak. Ask for the dressing on the  side, or ask for olive oil and vinegar or lemon to use as dressing.  Estimate how many servings of a food you are given. For example, a serving of cooked rice is  cup or about the size of half a tennis ball or one cupcake wrapper. Knowing serving sizes will help you be aware of how much food you are eating at restaurants. The list below tells you how big or small some common portion sizes are based on everyday objects.  1 oz--4 stacked dice.  3 oz--1 deck of cards.  1 tsp--1 dice.  1 Tbsp-- a Ping-Pong ball.  2 Tbsp--1 Ping-Pong ball.   cup--1 tennis ball or 1 cupcake  wrapper.  1 cup--1 baseball.   This information is not intended to replace advice given to you by your health care provider. Make sure you discuss any questions you have with your health care provider.   Document Released: 10/06/2005 Document Revised: 10/27/2014 Document Reviewed: 08/11/2013 Elsevier Interactive Patient Education 2016 [place] [employer] Exercising can help you to lose weight. In order to lose weight through exercise, you need to do vigorous-intensity exercise. You can tell that you are exercising with vigorous intensity if you are breathing very hard and fast and cannot hold a conversation while exercising. Moderate-intensity exercise helps to maintain your current weight. You can tell that you are exercising at a moderate level if you have a higher heart rate and faster breathing, but you are still able to hold a conversation. HOW OFTEN SHOULD I EXERCISE? Choose an activity that you enjoy and set realistic goals. Your health care provider can help you to make an activity plan that works for you. Exercise regularly as directed by your health care provider. This may include:  Doing resistance training twice each week, such as:  Push-ups.  Sit-ups.  Lifting weights.  Using resistance bands.  Doing a given intensity of exercise for a given amount of time. Choose from these options:  150 minutes of moderate-intensity exercise every week.  75 minutes of vigorous-intensity exercise every week.  A mix of moderate-intensity and vigorous-intensity exercise every week. Children, pregnant women, people who are out of shape, people who are overweight, and older adults may need to consult a health care provider for individual recommendations. If you have any sort of medical condition, be sure to consult your health care provider before starting a new exercise program. WHAT ARE SOME ACTIVITIES THAT CAN HELP ME TO LOSE WEIGHT?   Walking at a rate of  at least 4.5 miles an hour.  Jogging or running at a rate of 5 miles per hour.  Biking at a rate of at least 10 miles per hour.  Lap swimming.  Roller-skating or in-line skating.  Cross-country skiing.  Vigorous competitive sports, such as football, basketball, and soccer.  Jumping rope.  Aerobic dancing. HOW CAN I BE MORE ACTIVE IN MY DAY-TO-DAY ACTIVITIES?  Use the stairs instead of the elevator.  Take a walk during your lunch break.  If you drive, park your car farther away from work or school.  If you take public transportation, get off one stop early and walk the rest of the way.  Make all of your phone calls while standing up and walking around.  Get up, stretch, and walk around every 30 minutes throughout the day. WHAT GUIDELINES SHOULD I FOLLOW WHILE EXERCISING?  Do not exercise so much that you hurt yourself, feel dizzy, or get very short  of breath.  Consult your health care provider prior to starting a new exercise program.  Wear comfortable clothes and shoes with good support.  Drink plenty of water while you exercise to prevent dehydration or heat stroke. Body water is lost during exercise and must be replaced.  Work out until you breathe faster and your heart beats faster.   This information is not intended to replace advice given to you by your health care provider. Make sure you discuss any questions you have with your health care provider.   Document Released: 11/08/2010 Document Revised: 10/27/2014 Document Reviewed: 03/09/2014 Elsevier Interactive Patient Education [employer].

## 2016-06-17 NOTE — Progress Notes (Signed)
Pt is in the office today for establish care and hypertension Pt states she has been to the ent for her right ear but they were unable to find something wrong with it Pt states her legs start to swell a week before her cycle comes on per patient she is requesting fluid pills

## 2016-06-17 NOTE — Progress Notes (Addendum)
Latoya Juarez, is a 45 y.o. female  [id number]  [id number]  DOB - [birth year]  CC:  Chief Complaint  Patient presents with  . Establish Care  . Hypertension       HPI: Latoya Juarez is a 45 y.o. female here today to establish medical care, last seen in clinic 4/17, now here for f/u of htn. Per pt, adding less salt to food, eats out about 2-3 x every 2 wks.  Starting smoking again recently, 1 pack last her 3-4 days.  She is walking more, 1-2 miles daily, depending if she is working or not.  Hx of anemia, does not take iron supplements. Heavy menses, has been eating ice more lately.  Hx of left sided breast pain, mm pending still. Pt to call for reschedule of MM.  Pt c/o of intermittent right sided jaw/ear pain as well. Saw ENT, nothing found. Per pt, when pain gets bad, sometimes feels like her jaw "clicks" and gets headaches/neck pain sometimes due to it.  Patient has No headache, No chest pain, No abdominal pain - No Nausea, No new weakness tingling or numbness, No Cough - SOB.    Review of Systems: Per HPI, o/w all systems reviewed and negative.  No Known Allergies Past Medical History:  Diagnosis Date  . Obesity    No current outpatient prescriptions on file prior to visit.   No current facility-administered medications on file prior to visit.    No family history on file. Social History   Social History  . Marital status: Legally Separated    Spouse name: N/A  . Number of children: N/A  . Years of education: N/A   Occupational History  . Not on file.   Social History Main Topics  . Smoking status: Current Every Day Smoker  . Smokeless tobacco: Not on file  . Alcohol use Yes     Comment: socially  . Drug use: No  . Sexual activity: Not on file   Other Topics Concern  . Not on file   Social History Narrative  . No narrative on file    Objective:   Vitals:   06/17/16 1032  BP: (!) 152/94  Pulse: 81  Resp: 16  Temp: 98.4 F (36.9  C)    Filed Weights   06/17/16 1032  Weight: 252 lb 9.6 oz (114.6 kg)    BP Readings from Last 3 Encounters:  06/17/16 (!) 152/94  02/19/16 148/91  01/25/16 (!) 144/86    Physical Exam: Constitutional: Patient appears well-developed and well-nourished. No distress. AAOx3, morbid obese. HENT: Normocephalic, atraumatic, External right and left ear normal. Oropharynx is clear and moist.  bilat TMS clear, no ttp on right tmj.    Eyes: Conjunctivae and EOM are normal. PERRL, no scleral icterus. Neck: Normal ROM. Neck supple. No JVD. No tracheal deviation. No thyromegaly.   CVS: RRR, S1/S2 +, no murmurs, no gallops, no carotid bruit.  Pulmonary: Effort and breath sounds normal, no stridor, rhonchi, wheezes, rales.  Abdominal: Soft. BS +,obese, NO tenderness, rebound or guarding.  Musculoskeletal: Normal range of motion. No edema and no tenderness.  LE: bilat/ no c/c/e, pulses 2+ bilateral. Neuro: Alert. muscle tone coordination wnl. No cranial nerve deficit grossly. Skin: Skin is warm and dry. No rash noted. Not diaphoretic. No erythema. No pallor. Psychiatric: Normal mood and affect. Behavior, judgment, thought content normal.  Lab Results  Component Value Date   WBC 7.7 06/11/2014   HGB 7.8 (L) 06/11/2014   HCT 25.8 (  L) 06/11/2014   MCV 67.7 (L) 06/11/2014   PLT 378 06/11/2014   Lab Results  Component Value Date   CREATININE 0.62 06/11/2014   BUN 6 06/11/2014   NA 138 06/11/2014   K 3.9 06/11/2014   CL 103 06/11/2014   CO2 24 06/11/2014    No results found for: HGBA1C Lipid Panel  No results found for: CHOL, TRIG, HDL, CHOLHDL, VLDL, LDLCALC     Depression screen [facility] 2/9 06/17/2016 01/25/2016 12/12/2015 11/27/2015  Decreased Interest 0 0 0 0  Down, Depressed, Hopeless 0 0 0 0  PHQ - 2 Score 0 0 0 0    Assessment and plan:   1. HTN (hypertension), benign Uncontrolled, low salt/dash diet stressed, increase exercise, stop smoking recd - increase lisinopril 10 to  prinzide 20-25 qdy. - BASIC METABOLIC PANEL WITH GFR - Lipid Panel  2. Anemia, iron deficiency W/ increase ice consumption, hx of iron def anemia in past,  Heavy menses. - CBC with Differential/Platelet - HgB A1c - Iron, TIBC and Ferritin Panel  3. Morbid obesity, unspecified obesity type ([place]) rcd increase exercise, small frequent meals, reduce carbs, calorie counting, increase exercise, food diary  4. Thyroid disorder screen - TSH  5. Diabetes mellitus screening - HgB A1c 6.0, consider metformin next appt.  6. Encounter for screening for HIV - HIV antibody (with reflex)  7. Tobacco abuse tob cessation recd, recd pneumococcal vaccine, pt declined  8. Pathologic ice eating Due to iron def anemia?  9. Pt declined flu shot.  10. Prior hx of left breast pain, resolved, has MM pending. Pt to call for reschedule  11. Right ear pain? Neg wkup by ENT per pt. Tmj? Grinding teeth? Recd chewing gum less on that side.  Will follow  Return in about 2 months (around 08/17/2016) for htn.  The patient was given clear instructions to go to ER or return to medical center if symptoms don't improve, worsen or new problems develop. The patient verbalized understanding. The patient was told to call to get lab results if they haven't heard anything in the next week.    This note has been created with Surveyor, quantity. Any transcriptional errors are unintentional.   Maren Reamer, MD, [facility] [place], [place]   06/17/2016, 11:02 AM

## 2016-06-18 ENCOUNTER — Telehealth: Payer: Self-pay

## 2016-06-18 ENCOUNTER — Other Ambulatory Visit: Payer: Self-pay | Admitting: Internal Medicine

## 2016-06-18 LAB — IRON,TIBC AND FERRITIN PANEL
%SAT: 1 % — ABNORMAL LOW (ref 11–50)
Ferritin: 3 ng/mL — ABNORMAL LOW (ref 10–232)
TIBC: 557 ug/dL — AB (ref 250–450)

## 2016-06-18 LAB — LIPID PANEL
Cholesterol: 224 mg/dL — ABNORMAL HIGH (ref 125–200)
HDL: 74 mg/dL (ref >=46)
LDL CALC: 133 mg/dL — AB (ref <130)
Total CHOL/HDL Ratio: 3 Ratio (ref <=5.0)
Triglycerides: 85 mg/dL (ref <150)
VLDL: 17 mg/dL (ref <30)

## 2016-06-18 LAB — BASIC METABOLIC PANEL WITH GFR
BUN: 6 mg/dL — ABNORMAL LOW (ref 7–25)
CALCIUM: 9.7 mg/dL (ref 8.6–10.2)
CO2: 24 mmol/L (ref 20–31)
Chloride: 102 mmol/L (ref 98–110)
Creat: 0.61 mg/dL (ref 0.50–1.10)
GFR, Est African American: >89 mL/min (ref >=60)
GLUCOSE: 96 mg/dL (ref 65–99)
Potassium: 4 mmol/L (ref 3.5–5.3)
SODIUM: 137 mmol/L (ref 135–146)

## 2016-06-18 LAB — HIV ANTIBODY (ROUTINE TESTING W REFLEX): HIV 1&2 Ab, 4th Generation: NONREACTIVE

## 2016-06-18 MED ORDER — SENNOSIDES-DOCUSATE SODIUM 8.6-50 MG PO TABS: 1.0000 | ORAL_TABLET | Freq: Two times a day (BID) | ORAL | 2 refills | Status: DC | PRN

## 2016-06-18 MED ORDER — PRAVASTATIN SODIUM 20 MG PO TABS: 20.0000 mg | ORAL_TABLET | Freq: Every day | ORAL | 3 refills | Status: DC

## 2016-06-18 MED ORDER — FERROUS GLUCONATE 324 (38 FE) MG PO TABS: 324.0000 mg | ORAL_TABLET | Freq: Three times a day (TID) | ORAL | 3 refills | Status: DC

## 2016-06-18 NOTE — Telephone Encounter (Signed)
Contacted pt to go over lab results pt is aware of lab results and she would like the rx of iron pills sent to pharmacy. Pt states she doesn't have any questions or concerns

## 2016-08-08 MED FILL — PRAVASTATIN NA 20 MG TAB: 20 | 30 days supply | Qty: 30 | Fill #0

## 2016-08-08 MED FILL — LISINOPRIL-HCTZ 20-25 MG TA: 20-25 | 30 days supply | Qty: 30 | Fill #0

## 2016-09-17 MED FILL — LISINOPRIL-HCTZ 20-25 MG TA: 20-25 | 30 days supply | Qty: 30 | Fill #1

## 2016-09-17 MED FILL — PRAVASTATIN NA 20 MG TAB: 20 | 30 days supply | Qty: 30 | Fill #1

## 2016-10-23 MED FILL — PRAVASTATIN NA 20 MG TAB: 20 | 30 days supply | Qty: 30 | Fill #2

## 2016-10-23 MED FILL — LISINOPRIL-HCTZ 20-25 MG TA: 20-25 | 30 days supply | Qty: 30 | Fill #2

## 2016-11-20 MED FILL — LISINOPRIL-HCTZ 20-25 MG TA: 20-25 | 30 days supply | Qty: 30 | Fill #3

## 2016-11-20 MED FILL — PRAVASTATIN NA 20 MG TAB: 20 | 30 days supply | Qty: 30 | Fill #3

## 2016-11-28 ENCOUNTER — Ambulatory Visit: Payer: BLUE CROSS/BLUE SHIELD | Attending: Internal Medicine | Admitting: Physician Assistant

## 2016-11-28 VITALS — BP 147/94 | HR 72 | Temp 98.3°F | Resp 16 | Wt 232.4 lb

## 2016-11-28 DIAGNOSIS — F17200 Nicotine dependence, unspecified, uncomplicated: Secondary | ICD-10-CM | POA: Diagnosis not present

## 2016-11-28 DIAGNOSIS — E669 Obesity, unspecified: Secondary | ICD-10-CM | POA: Diagnosis not present

## 2016-11-28 DIAGNOSIS — D500 Iron deficiency anemia secondary to blood loss (chronic): Secondary | ICD-10-CM | POA: Insufficient documentation

## 2016-11-28 DIAGNOSIS — Z79899 Other long term (current) drug therapy: Secondary | ICD-10-CM | POA: Insufficient documentation

## 2016-11-28 DIAGNOSIS — Z6841 Body Mass Index (BMI) 40.0 and over, adult: Secondary | ICD-10-CM | POA: Diagnosis not present

## 2016-11-28 DIAGNOSIS — N926 Irregular menstruation, unspecified: Secondary | ICD-10-CM | POA: Diagnosis not present

## 2016-11-28 DIAGNOSIS — I10 Essential (primary) hypertension: Secondary | ICD-10-CM | POA: Insufficient documentation

## 2016-11-28 DIAGNOSIS — F5089 Other specified eating disorder: Secondary | ICD-10-CM | POA: Diagnosis not present

## 2016-11-28 LAB — CBC WITH DIFFERENTIAL/PLATELET
Basophils Absolute: 0 cells/uL (ref 0–200)
Basophils Relative: 0 %
EOS PCT: 2 %
Eosinophils Absolute: 256 cells/uL (ref 15–500)
HCT: 28.1 % — ABNORMAL LOW (ref 35.0–45.0)
HEMOGLOBIN: 8.4 g/dL — AB (ref 11.7–15.5)
LYMPHS ABS: 1920 {cells}/uL (ref 850–3900)
Lymphocytes Relative: 15 %
MCH: 20.9 pg — ABNORMAL LOW (ref 27.0–33.0)
MCHC: 29.9 g/dL — ABNORMAL LOW (ref 32.0–36.0)
MCV: 69.9 fL — ABNORMAL LOW (ref 80.0–100.0)
MPV: 9.2 fL (ref 7.5–12.5)
Monocytes Absolute: 640 cells/uL (ref 200–950)
Monocytes Relative: 5 %
Neutro Abs: 9984 cells/uL — ABNORMAL HIGH (ref 1500–7800)
Neutrophils Relative %: 78 %
PLATELETS: 594 10*3/uL — AB (ref 140–400)
RBC: 4.02 MIL/uL (ref 3.80–5.10)
RDW: 17.5 % — ABNORMAL HIGH (ref 11.0–15.0)
WBC: 12.8 10*3/uL — AB (ref 3.8–10.8)

## 2016-11-28 LAB — TSH: TSH: 1.25 mIU/L

## 2016-11-28 MED ORDER — FERROUS GLUCONATE 324 (38 FE) MG PO TABS: 324.0000 mg | ORAL_TABLET | Freq: Three times a day (TID) | ORAL | 3 refills | Status: DC

## 2016-11-28 NOTE — Progress Notes (Signed)
Latoya Juarez, is a 46 y.o. female  [id number]  [id number]  DOB - 08-30-1971  Subjective:  Chief Complaint and HPI: Latoya Juarez is a 46 y.o. female here today for multiple issues.  Periods irregular and heavy since November.  She thinks her mom went thru menopause around age 23.  She never started taking Iron as recommended.  She admits to anemia "her whole life."  She also admits to Pica with eating ice.    She is thinking about quitting smoking.  She is aiming toward the end of this month.   She is also working on losing weight. She is compliant with BP and cholesterol meds.  She denies CP/SOB/Dizziness.    ROS:   Constitutional:  No f/c, No night sweats, No unexplained weight loss. EENT:  No vision changes, No blurry vision, No hearing changes. No mouth, throat, or ear problems.  Respiratory: No cough, No SOB Cardiac: No CP, no palpitations GI:  No abd pain, No N/V/D. GU: No Urinary s/sx Musculoskeletal: No joint pain Neuro: No headache, no dizziness, no motor weakness.  Skin: No rash Endocrine:  No polydipsia. No polyuria.  Psych: Denies SI/HI  No problems updated.  ALLERGIES: No Known Allergies  PAST MEDICAL HISTORY: Past Medical History:  Diagnosis Date  . Obesity     MEDICATIONS AT HOME: Prior to Admission medications   Medication Sig Start Date End Date Taking? Authorizing Provider  lisinopril-hydrochlorothiazide (PRINZIDE,ZESTORETIC) 20-25 MG tablet Take 1 tablet by mouth daily. 06/17/16  Yes Maren Reamer, MD  pravastatin (PRAVACHOL) 20 MG tablet Take 1 tablet (20 mg total) by mouth daily. 06/18/16  Yes Maren Reamer, MD  ferrous gluconate (FERGON) 324 MG tablet Take 1 tablet (324 mg total) by mouth 3 (three) times daily with meals. 11/28/16   Argentina Donovan, PA-C  senna-docusate (SENOKOT-S) 8.6-50 MG tablet Take 1 tablet by mouth 2 (two) times daily as needed for mild constipation. Patient not taking: Reported on 11/28/2016 06/18/16   Maren Reamer, MD     Objective:  EXAM:   Vitals:   11/28/16 0913  BP: (!) 147/94  Pulse: 72  Resp: 16  Temp: 98.3 F (36.8 C)  TempSrc: Oral  SpO2: 96%  Weight: 232 lb 6.4 oz (105.4 kg)    General appearance : A&OX3. NAD. Non-toxic-appearing HEENT: Atraumatic and Normocephalic.  PERRLA. EOM intact.  TM clear B. Mouth-MMM, post pharynx WNL w/o erythema, No PND. Neck: supple, no JVD. No cervical lymphadenopathy. No thyromegaly Chest/Lungs:  Breathing-non-labored, Good air entry bilaterally, breath sounds normal without rales, rhonchi, or wheezing  CVS: S1 S2 regular, no murmurs, gallops, rubs  Extremities: Bilateral Lower Ext shows no edema, both legs are warm to touch with = pulse throughout Neurology:  CN II-XII grossly intact, Non focal.   Psych:  TP linear. J/I WNL. Normal speech. Appropriate eye contact and affect.  Skin:  No Rash  Data Review Lab Results  Component Value Date   HGBA1C 6.0 06/17/2016     Assessment & Plan   1. Iron deficiency anemia due to chronic blood loss - ferrous gluconate (FERGON) 324 MG tablet; Take 1 tablet (324 mg total) by mouth 3 (three) times daily with meals.  Dispense: 180 tablet; Refill: 3 - Iron, TIBC and Ferritin Panel - CBC with Differential/Platelet  2. Irregular periods, frequent/heavy periods X 3 months - TSH - FSH/LH  3. Pica in adults - Iron, TIBC and Ferritin Panel - CBC with Differential/Platelet  4. Smoking Info  given.  She is aiming toward the end of this month as a quit date  5. Essential hypertension Check blood pressure 3 times/week and record and bring to next visit Limit salt and sugar intake Drink 80-100 ounces of water daily.  Patient have been counseled extensively about nutrition and exercise.  I spent >40 mins face to face counseling on proper diet/water intake/smoking cessation/self-care/lifestyle changes/ and severity of anemia/importance of treatment and Pica.  She has never felt that treating her  anemia was important. Return in about 6 weeks (around 01/09/2017).  The patient was given clear instructions to go to ER or return to medical center if symptoms don't improve, worsen or new problems develop. The patient verbalized understanding. The patient was told to call to get lab results if they haven't heard anything in the next week.     Freeman Caldron, PA-C [facility] [place], [place]   11/28/2016, 9:48 AMPatient ID: Latoya Juarez, female   DOB: [birth year], 46 y.o.   MRN: [id number]

## 2016-11-28 NOTE — Patient Instructions (Addendum)
Check blood pressure 3 times/week and record and bring to next visit  Drink 80-100 ounces of water daily.  Limit salt and sugar intake   1-800-quitnow Steps to Quit Smoking Smoking tobacco can be harmful to your health and can affect almost every organ in your body. Smoking puts you, and those around you, at risk for developing many serious chronic diseases. Quitting smoking is difficult, but it is one of the best things that you can do for your health. It is never too late to quit. What are the benefits of quitting smoking? When you quit smoking, you lower your risk of developing serious diseases and conditions, such as:  Lung cancer or lung disease, such as COPD.  Heart disease.  Stroke.  Heart attack.  Infertility.  Osteoporosis and bone fractures. Additionally, symptoms such as coughing, wheezing, and shortness of breath may get better when you quit. You may also find that you get sick less often because your body is stronger at fighting off colds and infections. If you are pregnant, quitting smoking can help to reduce your chances of having a baby of low birth weight. How do I get ready to quit? When you decide to quit smoking, create a plan to make sure that you are successful. Before you quit:  Pick a date to quit. Set a date within the next two weeks to give you time to prepare.  Write down the reasons why you are quitting. Keep this list in places where you will see it often, such as on your bathroom mirror or in your car or wallet.  Identify the people, places, things, and activities that make you want to smoke (triggers) and avoid them. Make sure to take these actions:  Throw away all cigarettes at home, at work, and in your car.  Throw away smoking accessories, such as Scientist, research (medical).  Clean your car and make sure to empty the ashtray.  Clean your home, including curtains and carpets.  Tell your family, friends, and coworkers that you are quitting. Support  from your loved ones can make quitting easier.  Talk with your health care provider about your options for quitting smoking.  Find out what treatment options are covered by your health insurance. What strategies can I use to quit smoking? Talk with your healthcare provider about different strategies to quit smoking. Some strategies include:  Quitting smoking altogether instead of gradually lessening how much you smoke over a period of time. Research shows that quitting "cold [place]" is more successful than gradually quitting.  Attending in-person counseling to help you build problem-solving skills. You are more likely to have success in quitting if you attend several counseling sessions. Even short sessions of 10 minutes can be effective.  Finding resources and support systems that can help you to quit smoking and remain smoke-free after you quit. These resources are most helpful when you use them often. They can include:  Online chats with a Social worker.  Telephone quitlines.  Printed Furniture conservator/restorer.  Support groups or group counseling.  Text messaging programs.  Mobile phone applications.  Taking medicines to help you quit smoking. (If you are pregnant or breastfeeding, talk with your health care provider first.) Some medicines contain nicotine and some do not. Both types of medicines help with cravings, but the medicines that include nicotine help to relieve withdrawal symptoms. Your health care provider may recommend:  Nicotine patches, gum, or lozenges.  Nicotine inhalers or sprays.  Non-nicotine medicine that is taken by mouth.  Talk with your health care provider about combining strategies, such as taking medicines while you are also receiving in-person counseling. Using these two strategies together makes you more likely to succeed in quitting than if you used either strategy on its own. If you are pregnant or breastfeeding, talk with your health care provider about  finding counseling or other support strategies to quit smoking. Do not take medicine to help you quit smoking unless told to do so by your health care provider. What things can I do to make it easier to quit? Quitting smoking might feel overwhelming at first, but there is a lot that you can do to make it easier. Take these important actions:  Reach out to your family and friends and ask that they support and encourage you during this time. Call telephone quitlines, reach out to support groups, or work with a counselor for support.  Ask people who smoke to avoid smoking around you.  Avoid places that trigger you to smoke, such as bars, parties, or smoke-break areas at work.  Spend time around people who do not smoke.  Lessen stress in your life, because stress can be a smoking trigger for some people. To lessen stress, try:  Exercising regularly.  Deep-breathing exercises.  Yoga.  Meditating.  Performing a body scan. This involves closing your eyes, scanning your body from head to toe, and noticing which parts of your body are particularly tense. Purposefully relax the muscles in those areas.  Download or purchase mobile phone or tablet apps (applications) that can help you stick to your quit plan by providing reminders, tips, and encouragement. There are many free apps, such as QuitGuide from the [employer] Office manager for Disease Control and Prevention). You can find other support for quitting smoking (smoking cessation) through smokefree.gov and other websites. How will I feel when I quit smoking? Within the first 24 hours of quitting smoking, you may start to feel some withdrawal symptoms. These symptoms are usually most noticeable 2-3 days after quitting, but they usually do not last beyond 2-3 weeks. Changes or symptoms that you might experience include:  Mood swings.  Restlessness, anxiety, or irritation.  Difficulty concentrating.  Dizziness.  Strong cravings for sugary foods in  addition to nicotine.  Mild weight gain.  Constipation.  Nausea.  Coughing or a sore throat.  Changes in how your medicines work in your body.  A depressed mood.  Difficulty sleeping (insomnia). After the first 2-3 weeks of quitting, you may start to notice more positive results, such as:  Improved sense of smell and taste.  Decreased coughing and sore throat.  Slower heart rate.  Lower blood pressure.  Clearer skin.  The ability to breathe more easily.  Fewer sick days. Quitting smoking is very challenging for most people. Do not get discouraged if you are not successful the first time. Some people need to make many attempts to quit before they achieve long-term success. Do your best to stick to your quit plan, and talk with your health care provider if you have any questions or concerns. This information is not intended to replace advice given to you by your health care provider. Make sure you discuss any questions you have with your health care provider. Document Released: 09/30/2001 Document Revised: 06/03/2016 Document Reviewed: 02/20/2015 Elsevier Interactive Patient Education  2017 Else[employer] Inc. Anemia, Nonspecific Anemia is a condition in which the concentration of red blood cells or hemoglobin in the blood is below normal. Hemoglobin is a substance in red  blood cells that carries oxygen to the tissues of the body. Anemia results in not enough oxygen reaching these tissues. What are the causes? Common causes of anemia include:  Excessive bleeding. Bleeding may be internal or external. This includes excessive bleeding from periods (in women) or from the intestine.  Poor nutrition.  Chronic kidney, thyroid, and liver disease.  Bone marrow disorders that decrease red blood cell production.  Cancer and treatments for cancer.  HIV, AIDS, and their treatments.  Spleen problems that increase red blood cell destruction.  Blood disorders.  Excess destruction of  red blood cells due to infection, medicines, and autoimmune disorders. What are the signs or symptoms?  Minor weakness.  Dizziness.  Headache.  Palpitations.  Shortness of breath, especially with exercise.  Paleness.  Cold sensitivity.  Indigestion.  Nausea.  Difficulty sleeping.  Difficulty concentrating. Symptoms may occur suddenly or they may develop slowly. How is this diagnosed? Additional blood tests are often needed. These help your health care provider determine the best treatment. Your health care provider will check your stool for blood and look for other causes of blood loss. How is this treated? Treatment varies depending on the cause of the anemia. Treatment can include:  Supplements of iron, vitamin 123456, or folic acid.  Hormone medicines.  A blood transfusion. This may be needed if blood loss is severe.  Hospitalization. This may be needed if there is significant continual blood loss.  Dietary changes.  Spleen removal. Follow these instructions at home: Keep all follow-up appointments. It often takes many weeks to correct anemia, and having your health care provider check on your condition and your response to treatment is very important. Get help right away if:  You develop extreme weakness, shortness of breath, or chest pain.  You become dizzy or have trouble concentrating.  You develop heavy vaginal bleeding.  You develop a rash.  You have bloody or black, tarry stools.  You faint.  You vomit up blood.  You vomit repeatedly.  You have abdominal pain.  You have a fever or persistent symptoms for more than 2-3 days.  You have a fever and your symptoms suddenly get worse.  You are dehydrated. This information is not intended to replace advice given to you by your health care provider. Make sure you discuss any questions you have with your health care provider. Document Released: 11/13/2004 Document Revised: 03/19/2016 Document  Reviewed: 04/01/2013 Elsevier Interactive Patient Education  2017 [employer].

## 2016-11-29 LAB — FSH/LH
FSH: 13.8 m[IU]/mL
LH: 35.7 m[IU]/mL

## 2016-11-29 LAB — IRON,TIBC AND FERRITIN PANEL
%SAT: 1 % — ABNORMAL LOW (ref 11–50)
Ferritin: 6 ng/mL — ABNORMAL LOW (ref 10–232)
Iron: <10 ug/dL — ABNORMAL LOW (ref 40–190)
TIBC: 494 ug/dL — ABNORMAL HIGH (ref 250–450)

## 2016-12-03 ENCOUNTER — Telehealth: Payer: Self-pay

## 2016-12-03 NOTE — Telephone Encounter (Signed)
Contacted pt to go over lab results pt didn't answer lvm asking pt to give me a call at her earliest convenience   If patient call back please give results: Her hemoglobin and iron are low as we expected. Take iron 3 times daily for 2 weeks then 2 times daily for 1 month, then 1 tablet daily. Drink a lot of water. We will recheck her levels at her f/up appointment. Her thyroid and hormone levels are normal. It does not look as though she is in menopause.

## 2016-12-26 MED FILL — LISINOPRIL-HCTZ 20-25 MG TA: 20-25 | 30 days supply | Qty: 30 | Fill #4

## 2016-12-26 MED FILL — PRAVASTATIN NA 20 MG TAB: 20 | 30 days supply | Qty: 30 | Fill #4

## 2017-01-27 MED FILL — LISINOPRIL-HCTZ 20-25 MG TA: 20-25 | 30 days supply | Qty: 30 | Fill #5

## 2017-01-27 MED FILL — PRAVASTATIN NA 20 MG TAB: 20 | 30 days supply | Qty: 30 | Fill #5

## 2017-02-03 ENCOUNTER — Ambulatory Visit: Payer: BLUE CROSS/BLUE SHIELD | Attending: Internal Medicine | Admitting: Internal Medicine

## 2017-02-03 VITALS — BP 120/80 | HR 76 | Temp 98.1°F | Resp 16 | Wt 227.4 lb

## 2017-02-03 DIAGNOSIS — I10 Essential (primary) hypertension: Secondary | ICD-10-CM | POA: Diagnosis not present

## 2017-02-03 DIAGNOSIS — F5089 Other specified eating disorder: Secondary | ICD-10-CM | POA: Diagnosis not present

## 2017-02-03 DIAGNOSIS — Z6841 Body Mass Index (BMI) 40.0 and over, adult: Secondary | ICD-10-CM | POA: Insufficient documentation

## 2017-02-03 DIAGNOSIS — E785 Hyperlipidemia, unspecified: Secondary | ICD-10-CM | POA: Insufficient documentation

## 2017-02-03 DIAGNOSIS — N921 Excessive and frequent menstruation with irregular cycle: Secondary | ICD-10-CM

## 2017-02-03 DIAGNOSIS — R7303 Prediabetes: Secondary | ICD-10-CM | POA: Insufficient documentation

## 2017-02-03 DIAGNOSIS — D509 Iron deficiency anemia, unspecified: Secondary | ICD-10-CM | POA: Diagnosis not present

## 2017-02-03 DIAGNOSIS — N920 Excessive and frequent menstruation with regular cycle: Secondary | ICD-10-CM | POA: Insufficient documentation

## 2017-02-03 DIAGNOSIS — M79601 Pain in right arm: Secondary | ICD-10-CM | POA: Diagnosis not present

## 2017-02-03 DIAGNOSIS — Z1321 Encounter for screening for nutritional disorder: Secondary | ICD-10-CM

## 2017-02-03 DIAGNOSIS — E669 Obesity, unspecified: Secondary | ICD-10-CM | POA: Diagnosis not present

## 2017-02-03 MED ORDER — PRAVASTATIN SODIUM 20 MG PO TABS: 20.0000 mg | ORAL_TABLET | Freq: Every day | ORAL | 3 refills | Status: DC

## 2017-02-03 MED ORDER — LISINOPRIL-HYDROCHLOROTHIAZIDE 20-25 MG PO TABS: 1.0000 | ORAL_TABLET | Freq: Every day | ORAL | 3 refills | Status: DC

## 2017-02-03 MED ORDER — METFORMIN HCL 500 MG PO TABS: 500.0000 mg | ORAL_TABLET | Freq: Every day | ORAL | 3 refills | Status: DC

## 2017-02-03 MED ORDER — METFORMIN HCL 500 MG PO TABS: 500.0000 mg | ORAL_TABLET | Freq: Two times a day (BID) | ORAL | 3 refills | Status: DC

## 2017-02-03 MED FILL — metFORMIN HCL 500 MG TABS: 500 | 30 days supply | Qty: 30 | Fill #0

## 2017-02-03 NOTE — Patient Instructions (Addendum)
You can take metformin up to 2 times a day if tolerates it.  If too many symptoms with metformin, call us and can switch to long acting medication.  Metformin helps your body process sugars better, also helps w/ some weight loss as well.   But can cause some indigestion/n/diarrhea, but symptoms usually resolved in 2 wks or so.  Simple diabetes teaching// Aim for 30 minutes of exercise most days. Rethink what you drink. Water is great! Aim for 2-3 Carb Choices per meal (30-45 grams) +/- 1 either way.  Aim for 0-15 Carbs per snack if hungry.  Include protein in moderation with your meals and snacks.  Consider reading food labels for Total Carbohydrate and Fat Grams of foods  Consider checking blood glucose (accuchecks/BG) at alternate times per day.  Continue taking medication as directed. Be mindful about how much sugar you are adding to beverages and other foods. Fruit Punch - find one with no sugar  Measure and decrease portions of carbohydrate foods. Make your plate and don't go back for seconds.  Preventing Type 2 Diabetes Mellitus Type 2 diabetes (type 2 diabetes mellitus) is a long-term (chronic) disease that affects blood sugar (glucose) levels. Normally, a hormone called insulin allows glucose to enter cells in the body. The cells use glucose for energy. In type 2 diabetes, one or both of these problems may be present:  The body does not make enough insulin.  The body does not respond properly to insulin that it makes (insulin resistance). Insulin resistance or lack of insulin causes excess glucose to build up in the blood instead of going into cells. As a result, high blood glucose (hyperglycemia) develops, which can cause many complications. Being overweight or obese and having an inactive (sedentary) lifestyle can increase your risk for diabetes. Type 2 diabetes can be delayed or prevented by making certain nutrition and lifestyle changes. What nutrition changes can be  made?  Eat healthy meals and snacks regularly. Keep a healthy snack with you for when you get hungry between meals, such as fruit or a handful of nuts.  Eat lean meats and proteins that are low in saturated fats, such as chicken, fish, egg whites, and beans. Avoid processed meats.  Eat plenty of fruits and vegetables and plenty of grains that have not been processed (whole grains). It is recommended that you eat:  1?2 cups of fruit every day.  2?3 cups of vegetables every day.  6?8 oz of whole grains every day, such as oats, whole wheat, bulgur, brown rice, quinoa, and millet.  Eat low-fat dairy products, such as milk, yogurt, and cheese.  Eat foods that contain healthy fats, such as nuts, avocado, olive oil, and canola oil.  Drink water throughout the day. Avoid drinks that contain added sugar, such as soda or sweet tea.  Follow instructions from your health care provider about specific eating or drinking restrictions.  Control how much food you eat at a time (portion size).  Check food labels to find out the serving sizes of foods.  Use a kitchen scale to weigh amounts of foods.  Saute or steam food instead of frying it. Cook with water or broth instead of oils or butter.  Limit your intake of:  Salt (sodium). Have no more than 1 tsp (2,400 mg) of sodium a day. If you have heart disease or high blood pressure, have less than ? tsp (1,500 mg) of sodium a day.  Saturated fat. This is fat that is solid at  room temperature, such as butter or fat on meat. What lifestyle changes can be made?   Activity   Do moderate-intensity physical activity for at least 30 minutes on at least 5 days of the week, or as much as told by your health care provider.  Ask your health care provider what activities are safe for you. A mix of physical activities may be best, such as walking, swimming, cycling, and strength training.  Try to add physical activity into your day. For example:  Park  in spots that are farther away than usual, so that you walk more. For example, park in a far corner of the parking lot when you go to the office or the grocery store.  Take a walk during your lunch break.  Use stairs instead of elevators or escalators. Weight Loss   Lose weight as directed. Your health care provider can determine how much weight loss is best for you and can help you lose weight safely.  If you are overweight or obese, you may be instructed to lose at least 5?7 % of your body weight. Alcohol and Tobacco      Do not use any tobacco products, such as cigarettes, chewing tobacco, and e-cigarettes. If you need help quitting, ask your health care provider. Work With [place] Provider   Have your blood glucose tested regularly, as told by your health care provider.  Discuss your risk factors and how you can reduce your risk for diabetes.  Get screening tests as told by your health care provider. You may have screening tests regularly, especially if you have certain risk factors for type 2 diabetes.  Make an appointment with a diet and nutrition specialist (registered dietitian). A registered dietitian can help you make a healthy eating plan and can help you understand portion sizes and food labels. Why are these changes important?  It is possible to prevent or delay type 2 diabetes and related health problems by making lifestyle and nutrition changes.  It can be difficult to recognize signs of type 2 diabetes. The best way to avoid possible damage to your body is to take actions to prevent the disease before you develop symptoms. What can happen if changes are not made?  Your blood glucose levels may keep increasing. Having high blood glucose for a long time is dangerous. Too much glucose in your blood can damage your blood vessels, heart, kidneys, nerves, and eyes.  You may develop prediabetes or type 2 diabetes. Type 2 diabetes can lead to many chronic health  problems and complications, such as:  Heart disease.  Stroke.  Blindness.  Kidney disease.  Depression.  Poor circulation in the feet and legs, which could lead to surgical removal (amputation) in severe cases. Where to find support:  Ask your health care provider to recommend a registered dietitian, diabetes educator, or weight loss program.  Look for local or online weight loss groups.  Join a gym, fitness club, or outdoor activity group, such as a walking club. Where to find more information: To learn more about diabetes and diabetes prevention, visit:  American Diabetes Association (ADA): www.diabetes.[employer] of Diabetes and Digestive and Kidney Diseases: FindSpin.nl To learn more about healthy eating, visit:  The U.S. Department of Agriculture Scientist, research (physical sciences)), Choose My Plate: http://wiley-williams.com/  Office of Disease Prevention and Health Promotion (ODPHP), Dietary Guidelines: SurferLive.at Summary  You can reduce your risk for type 2 diabetes by increasing your physical activity, eating healthy foods, and losing weight  as directed.  Talk with your health care provider about your risk for type 2 diabetes. Ask about any blood tests or screening tests that you need to have. This information is not intended to replace advice given to you by your health care provider. Make sure you discuss any questions you have with your health care provider. Document Released: 01/28/2016 Document Revised: 03/13/2016 Document Reviewed: 11/27/2015 Elsevier Interactive Patient Education  2017 Elsevier Inc. -  Low-Sodium Eating Plan Sodium, which is an element that makes up salt, helps you maintain a healthy balance of fluids in your body. Too much sodium can increase your blood pressure and cause fluid and waste to be held in your body. Your health care provider or dietitian may recommend following this plan if you  have high blood pressure (hypertension), kidney disease, liver disease, or heart failure. Eating less sodium can help lower your blood pressure, reduce swelling, and protect your heart, liver, and kidneys. What are tips for following this plan? General guidelines   Most people on this plan should limit their sodium intake to 1,500-2,000 mg (milligrams) of sodium each day. Reading food labels   The Nutrition Facts label lists the amount of sodium in one serving of the food. If you eat more than one serving, you must multiply the listed amount of sodium by the number of servings.  Choose foods with less than 140 mg of sodium per serving.  Avoid foods with 300 mg of sodium or more per serving. Shopping   Look for lower-sodium products, often labeled as "low-sodium" or "no salt added."  Always check the sodium content even if foods are labeled as "unsalted" or "no salt added".  Buy fresh foods.  Avoid canned foods and premade or frozen meals.  Avoid canned, cured, or processed meats  Buy breads that have less than 80 mg of sodium per slice. Cooking   Eat more home-cooked food and less restaurant, buffet, and fast food.  Avoid adding salt when cooking. Use salt-free seasonings or herbs instead of table salt or sea salt. Check with your health care provider or pharmacist before using salt substitutes.  Cook with plant-based oils, such as canola, sunflower, or olive oil. Meal planning   When eating at a restaurant, ask that your food be prepared with less salt or no salt, if possible.  Avoid foods that contain MSG (monosodium glutamate). MSG is sometimes added to [place] food, bouillon, and some canned foods. What foods are recommended? The items listed may not be a complete list. Talk with your dietitian about what dietary choices are best for you. Grains  Low-sodium cereals, including oats, puffed wheat and rice, and shredded wheat. Low-sodium crackers. Unsalted rice. Unsalted  pasta. Low-sodium bread. Whole-grain breads and whole-grain pasta. Vegetables  Fresh or frozen vegetables. "No salt added" canned vegetables. "No salt added" tomato sauce and paste. Low-sodium or reduced-sodium tomato and vegetable juice. Fruits  Fresh, frozen, or canned fruit. Fruit juice. Meats and other protein foods  Fresh or frozen (no salt added) meat, poultry, seafood, and fish. Low-sodium canned tuna and salmon. Unsalted nuts. Dried peas, beans, and lentils without added salt. Unsalted canned beans. Eggs. Unsalted nut butters. Dairy  Milk. Soy milk. Cheese that is naturally low in sodium, such as ricotta cheese, fresh mozzarella, or Swiss cheese Low-sodium or reduced-sodium cheese. Cream cheese. Yogurt. Fats and oils  Unsalted butter. Unsalted margarine with no trans fat. Vegetable oils such as canola or olive oils. Seasonings and other foods  Fresh and  dried herbs and spices. Salt-free seasonings. Low-sodium mustard and ketchup. Sodium-free salad dressing. Sodium-free light mayonnaise. Fresh or refrigerated horseradish. Lemon juice. Vinegar. Homemade, reduced-sodium, or low-sodium soups. Unsalted popcorn and pretzels. Low-salt or salt-free chips. What foods are not recommended? The items listed may not be a complete list. Talk with your dietitian about what dietary choices are best for you. Grains  Instant hot cereals. Bread stuffing, pancake, and biscuit mixes. Croutons. Seasoned rice or pasta mixes. Noodle soup cups. Boxed or frozen macaroni and cheese. Regular salted crackers. Self-rising flour. Vegetables  Sauerkraut, pickled vegetables, and relishes. Olives. [place] fries. Onion rings. Regular canned vegetables (not low-sodium or reduced-sodium). Regular canned tomato sauce and paste (not low-sodium or reduced-sodium). Regular tomato and vegetable juice (not low-sodium or reduced-sodium). Frozen vegetables in sauces. Meats and other protein foods  Meat or fish that is salted, canned,  smoked, spiced, or pickled. Bacon, ham, sausage, hotdogs, corned beef, chipped beef, packaged lunch meats, salt pork, jerky, pickled herring, anchovies, regular canned tuna, sardines, salted nuts. Dairy  Processed cheese and cheese spreads. Cheese curds. Blue cheese. Feta cheese. String cheese. Regular cottage cheese. Buttermilk. Canned milk. Fats and oils  Salted butter. Regular margarine. Ghee. Bacon fat. Seasonings and other foods  Onion salt, garlic salt, seasoned salt, table salt, and sea salt. Canned and packaged gravies. Worcestershire sauce. Tartar sauce. Barbecue sauce. Teriyaki sauce. Soy sauce, including reduced-sodium. Steak sauce. Fish sauce. Oyster sauce. Cocktail sauce. Horseradish that you find on the shelf. Regular ketchup and mustard. Meat flavorings and tenderizers. Bouillon cubes. Hot sauce and Tabasco sauce. Premade or packaged marinades. Premade or packaged taco seasonings. Relishes. Regular salad dressings. Salsa. Potato and tortilla chips. Corn chips and puffs. Salted popcorn and pretzels. Canned or dried soups. Pizza. Frozen entrees and pot pies. Summary  Eating less sodium can help lower your blood pressure, reduce swelling, and protect your heart, liver, and kidneys.  Most people on this plan should limit their sodium intake to 1,500-2,000 mg (milligrams) of sodium each day.  Canned, boxed, and frozen foods are high in sodium. Restaurant foods, fast foods, and pizza are also very high in sodium. You also get sodium by adding salt to food.  Try to cook at home, eat more fresh fruits and vegetables, and eat less fast food, canned, processed, or prepared foods. This information is not intended to replace advice given to you by your health care provider. Make sure you discuss any questions you have with your health care provider. Document Released: 03/28/2002 Document Revised: 09/29/2016 Document Reviewed: 09/29/2016 Elsevier Interactive Patient Education  2017 Elsevier  Inc.  -  Cholesterol Cholesterol is a fat. Your body needs a small amount of cholesterol. Cholesterol (plaque) may build up in your blood vessels (arteries). That makes you more likely to have a heart attack or stroke. You cannot feel your cholesterol level. Having a blood test is the only way to find out if your level is high. Keep your test results. Work with your doctor to keep your cholesterol at a good level. What do the results mean?  Total cholesterol is how much cholesterol is in your blood.  LDL is bad cholesterol. This is the type that can build up. Try to have low LDL.  HDL is good cholesterol. It cleans your blood vessels and carries LDL away. Try to have high HDL.  Triglycerides are fat that the body can store or burn for energy. What are good levels of cholesterol?  Total cholesterol below 200.  LDL  below 100 is good for people who have health risks. LDL below 70 is good for people who have very high risks.  HDL above 40 is good. It is best to have HDL of 60 or higher.  Triglycerides below 150. How can I lower my cholesterol? Diet  Follow your diet program as told by your doctor.  Choose fish, white meat chicken, or [place] that is roasted or baked. Try not to eat red meat, fried foods, sausage, or lunch meats.  Eat lots of fresh fruits and vegetables.  Choose whole grains, beans, pasta, potatoes, and cereals.  Choose olive oil, corn oil, or canola oil. Only use small amounts.  Try not to eat butter, mayonnaise, shortening, or palm kernel oils.  Try not to eat foods with trans fats.  Choose low-fat or nonfat dairy foods.  Drink skim or nonfat milk.  Eat low-fat or nonfat yogurt and cheeses.  Try not to drink whole milk or cream.  Try not to eat ice cream, egg yolks, or full-fat cheeses.  Healthy desserts include angel food cake, ginger snaps, animal crackers, hard candy, popsicles, and low-fat or nonfat frozen yogurt. Try not to eat pastries, cakes,  pies, and cookies. Exercise  Follow your exercise program as told by your doctor.  Be more active. Try gardening, walking, and taking the stairs.  Ask your doctor about ways that you can be more active. Medicine  Take over-the-counter and prescription medicines only as told by your doctor. This information is not intended to replace advice given to you by your health care provider. Make sure you discuss any questions you have with your health care provider. Document Released: 01/02/2009 Document Revised: 05/07/2016 Document Reviewed: 04/17/2016 Elsevier Interactive Patient Education  2017 [employer].

## 2017-02-03 NOTE — Progress Notes (Signed)
Latoya Juarez, is a 46 y.o. female  EVO:[id number]  [id number]  DOB - Jul 17, 1971  Chief Complaint  Patient presents with  . Hypertension  . Referral        Subjective:   Latoya Juarez is a 46 y.o. female here today for a follow up visit, last seen in clinic 2/18 for iron def anemia, hx of pica with ice which has improved since pt started iron pills, and htn. Also per labs, hx of prediabetes.  Pt co of menorrhagia, heavy menses, sometimes last for weeks, requested to see ob for treatment options.  Co of occasional right arm pain when she heavy lifts as well. She is right handed.  Here w/ her grown dgt.  Patient has No headache, No chest pain, No abdominal pain - No Nausea, No new weakness tingling or numbness, No Cough - SOB.  Problem  Iron Deficiency Anemia  Pica in Adults  Hyperlipidemia    ALLERGIES: No Known Allergies  PAST MEDICAL HISTORY: Past Medical History:  Diagnosis Date  . Obesity     MEDICATIONS AT HOME: Prior to Admission medications   Medication Sig Start Date End Date Taking? Authorizing Provider  ferrous gluconate (FERGON) 324 MG tablet Take 1 tablet (324 mg total) by mouth 3 (three) times daily with meals. 11/28/16   Argentina Donovan, PA-C  lisinopril-hydrochlorothiazide (PRINZIDE,ZESTORETIC) 20-25 MG tablet Take 1 tablet by mouth daily. 02/03/17   Maren Reamer, MD  metFORMIN (GLUCOPHAGE) 500 MG tablet Take 1 tablet (500 mg total) by mouth daily with breakfast. 02/03/17   Maren Reamer, MD  pravastatin (PRAVACHOL) 20 MG tablet Take 1 tablet (20 mg total) by mouth daily. 02/03/17   Maren Reamer, MD  senna-docusate (SENOKOT-S) 8.6-50 MG tablet Take 1 tablet by mouth 2 (two) times daily as needed for mild constipation. Patient not taking: Reported on 11/28/2016 06/18/16   Maren Reamer, MD     Objective:   Vitals:   02/03/17 0910  BP: 120/80  Pulse: 76  Resp: 16  Temp: 98.1 F (36.7 C)  TempSrc: Oral  SpO2: 97%  Weight: 227 lb  6.4 oz (103.1 kg)    Exam General appearance : Awake, alert, not in any distress. Speech Clear. Not toxic looking, obese, pleasant. HEENT: Atraumatic and Normocephalic, pupils equally reactive to light. Neck: supple, no JVD.  Chest:Good air entry bilaterally, no added sounds. CVS: S1 S2 regular, no murmurs/gallups or rubs. Abdomen: Bowel sounds active, Non tender, obese, and not distended with no gaurding, rigidity or rebound. Extremities: B/L Lower Ext shows no edema, both legs are warm to touch, mild ttp to medial aspect on right arm on palpation, no ttp on extension/flexion/around elbow or shoulders. Neurology: Awake alert, and oriented X 3, CN II-XII grossly intact, Non focal Skin:No Rash  Data Review Lab Results  Component Value Date   HGBA1C 6.0 06/17/2016    Depression screen [facility] 2/9 02/03/2017 11/28/2016 06/17/2016 01/25/2016 12/12/2015  Decreased Interest 0 0 0 0 0  Down, Depressed, Hopeless 0 0 0 0 0  PHQ - 2 Score 0 0 0 0 0      Assessment & Plan   1. Iron deficiency anemia, unspecified iron deficiency anemia type - continue iron tid, on stool softeners to prevent constipation - CBC with Differential - CMP and Liver - Ambulatory referral to Obstetrics / Gynecology  2. Menorrhagia with irregular cycle - consider ocp, but pt wants to talk to ob first. - Ambulatory referral to Obstetrics /  Gynecology  3. Pica in adults Better since on iron  4. Essential hypertension Well controlled, low salt diet encouraged - cont prinzide 209-25 qd  5. Hyperlipidemia, unspecified hyperlipidemia type - cont pravastatin 20 qhs - Lipid Panel  6. Prediabetes aic 6.0 (8/17) dw pt diet/exercise recds for predm. Amendable to starting metformin, 500 qd. If she can tol sided effects, can increase to bid dosing.  7. Encounter for vitamin deficiency screening - VITAMIN D 25 Hydroxy (Vit-D Deficiency, Fractures)  8. Right arm pain, suspect msk pain. Recd rice/warm heat as  needed.   Patient have been counseled extensively about nutrition and exercise  Return in about 3 months (around 05/05/2017), or if symptoms worsen or fail to improve.  The patient was given clear instructions to go to ER or return to medical center if symptoms don't improve, worsen or new problems develop. The patient verbalized understanding. The patient was told to call to get lab results if they haven't heard anything in the next week.   This note has been created with Surveyor, quantity. Any transcriptional errors are unintentional.   Maren Reamer, MD, [facility] [place], [place]   02/03/2017, 9:32 AM

## 2017-02-04 ENCOUNTER — Other Ambulatory Visit: Payer: Self-pay | Admitting: Internal Medicine

## 2017-02-04 DIAGNOSIS — D500 Iron deficiency anemia secondary to blood loss (chronic): Secondary | ICD-10-CM

## 2017-02-04 LAB — CMP AND LIVER
ALT: 19 IU/L (ref 0–32)
AST: 21 IU/L (ref 0–40)
Albumin: 4.3 g/dL (ref 3.5–5.5)
Alkaline Phosphatase: 73 IU/L (ref 39–117)
BILIRUBIN TOTAL: 0.3 mg/dL (ref 0.0–1.2)
BUN: 18 mg/dL (ref 6–24)
Bilirubin, Direct: 0.07 mg/dL (ref 0.00–0.40)
CO2: 21 mmol/L (ref 18–29)
Calcium: 10 mg/dL (ref 8.7–10.2)
Chloride: 99 mmol/L (ref 96–106)
Creatinine, Ser: 0.93 mg/dL (ref 0.57–1.00)
GFR calc Af Amer: 86 mL/min/{1.73_m2} (ref >59)
GFR, EST NON AFRICAN AMERICAN: 74 mL/min/{1.73_m2} (ref >59)
Glucose: 106 mg/dL — ABNORMAL HIGH (ref 65–99)
POTASSIUM: 4.5 mmol/L (ref 3.5–5.2)
Sodium: 135 mmol/L (ref 134–144)
Total Protein: 7.5 g/dL (ref 6.0–8.5)

## 2017-02-04 LAB — CBC WITH DIFFERENTIAL/PLATELET
BASOS ABS: 0.1 10*3/uL (ref 0.0–0.2)
Basos: 1 %
EOS (ABSOLUTE): 0.3 10*3/uL (ref 0.0–0.4)
Eos: 4 %
HEMOGLOBIN: 12.6 g/dL (ref 11.1–15.9)
Hematocrit: 37.7 % (ref 34.0–46.6)
Immature Grans (Abs): 0 10*3/uL (ref 0.0–0.1)
Immature Granulocytes: 0 %
LYMPHS ABS: 1.9 10*3/uL (ref 0.7–3.1)
Lymphs: 22 %
MCH: 28.3 pg (ref 26.6–33.0)
MCHC: 33.4 g/dL (ref 31.5–35.7)
MCV: 85 fL (ref 79–97)
Monocytes Absolute: 0.5 10*3/uL (ref 0.1–0.9)
Monocytes: 6 %
Neutrophils Absolute: 5.9 10*3/uL (ref 1.4–7.0)
Neutrophils: 67 %
PLATELETS: 373 10*3/uL (ref 150–379)
RBC: 4.46 x10E6/uL (ref 3.77–5.28)
RDW: 23.4 % — ABNORMAL HIGH (ref 12.3–15.4)
WBC: 8.6 10*3/uL (ref 3.4–10.8)

## 2017-02-04 LAB — LIPID PANEL
CHOL/HDL RATIO: 3.7 ratio (ref 0.0–4.4)
Cholesterol, Total: 205 mg/dL — ABNORMAL HIGH (ref 100–199)
HDL: 55 mg/dL (ref >39)
LDL CALC: 119 mg/dL — AB (ref 0–99)
Triglycerides: 157 mg/dL — ABNORMAL HIGH (ref 0–149)
VLDL Cholesterol Cal: 31 mg/dL (ref 5–40)

## 2017-02-04 LAB — VITAMIN D 25 HYDROXY (VIT D DEFICIENCY, FRACTURES): Vit D, 25-Hydroxy: 17.2 ng/mL — ABNORMAL LOW (ref 30.0–100.0)

## 2017-02-04 MED ORDER — FERROUS GLUCONATE 324 (38 FE) MG PO TABS: 324.0000 mg | ORAL_TABLET | Freq: Every day | ORAL | 3 refills | Status: DC

## 2017-02-04 MED ORDER — VITAMIN D (ERGOCALCIFEROL) 1.25 MG (50000 UNIT) PO CAPS: 50000.0000 [IU] | ORAL_CAPSULE | ORAL | 0 refills | Status: DC

## 2017-02-04 MED FILL — VIT D2 1.25 MG (50,000 UNIT: 1.25 MG | 84 days supply | Qty: 12 | Fill #0 | Status: TO

## 2017-02-06 ENCOUNTER — Telehealth: Payer: Self-pay

## 2017-02-06 NOTE — Telephone Encounter (Signed)
Contacted pt to go over lab results pt didn't answer lvm asking pt go give me call at her earliest convenience

## 2017-02-10 ENCOUNTER — Telehealth: Payer: Self-pay

## 2017-02-10 ENCOUNTER — Encounter: Payer: Self-pay | Admitting: Obstetrics and Gynecology

## 2017-02-10 DIAGNOSIS — R58 Hemorrhage, not elsewhere classified: Secondary | ICD-10-CM

## 2017-02-10 NOTE — Telephone Encounter (Signed)
Patient has been scheduled for pelvic US and transvaginal US

## 2017-02-12 ENCOUNTER — Ambulatory Visit (HOSPITAL_COMMUNITY): Admission: RE | Admit: 2017-02-12 | Payer: BLUE CROSS/BLUE SHIELD | Source: Ambulatory Visit

## 2017-02-20 ENCOUNTER — Other Ambulatory Visit (HOSPITAL_COMMUNITY)
Admission: RE | Admit: 2017-02-20 | Discharge: 2017-02-20 | Disposition: A | Discharge status: Home / Self Care | Payer: BLUE CROSS/BLUE SHIELD | Source: Ambulatory Visit | Attending: Obstetrics and Gynecology | Admitting: Obstetrics and Gynecology

## 2017-02-20 ENCOUNTER — Encounter: Payer: Self-pay | Admitting: Obstetrics and Gynecology

## 2017-02-20 ENCOUNTER — Ambulatory Visit (INDEPENDENT_AMBULATORY_CARE_PROVIDER_SITE_OTHER): Payer: BLUE CROSS/BLUE SHIELD | Admitting: Obstetrics and Gynecology

## 2017-02-20 ENCOUNTER — Other Ambulatory Visit: Payer: Self-pay | Admitting: Obstetrics and Gynecology

## 2017-02-20 VITALS — BP 102/69 | HR 72 | Ht 61.0 in | Wt 225.4 lb

## 2017-02-20 DIAGNOSIS — Z113 Encounter for screening for infections with a predominantly sexual mode of transmission: Secondary | ICD-10-CM

## 2017-02-20 DIAGNOSIS — N938 Other specified abnormal uterine and vaginal bleeding: Secondary | ICD-10-CM

## 2017-02-20 DIAGNOSIS — Z1231 Encounter for screening mammogram for malignant neoplasm of breast: Secondary | ICD-10-CM | POA: Diagnosis not present

## 2017-02-20 DIAGNOSIS — E669 Obesity, unspecified: Secondary | ICD-10-CM | POA: Insufficient documentation

## 2017-02-20 DIAGNOSIS — F17200 Nicotine dependence, unspecified, uncomplicated: Secondary | ICD-10-CM | POA: Diagnosis not present

## 2017-02-20 DIAGNOSIS — Z6841 Body Mass Index (BMI) 40.0 and over, adult: Secondary | ICD-10-CM | POA: Insufficient documentation

## 2017-02-20 DIAGNOSIS — N711 Chronic inflammatory disease of uterus: Secondary | ICD-10-CM | POA: Diagnosis not present

## 2017-02-20 MED ORDER — MEDROXYPROGESTERONE ACETATE 10 MG PO TABS: 20.0000 mg | ORAL_TABLET | Freq: Every day | ORAL | 6 refills | Status: DC

## 2017-02-20 NOTE — Progress Notes (Signed)
46 yo G5P5 with BMI 42 who is referred for the evaluation of menorrhagia causing iron deficiency anemia. Patient reports a monthly cycle with vaginal bleeding for 5 days until November when her vaginal bleeding lasted up to 14 days, heavy with the passage of clots. Patient is not currently sexually active. She has had a BTL. She reports the presence of a vaginal discharge with odor at times. She denies any pelvic pain but reports some dysmenorrhea  Past Medical History:  Diagnosis Date  . Obesity    Past Surgical History:  Procedure Laterality Date  . CESAREAN SECTION     History reviewed. No pertinent family history. Social History  Substance Use Topics  . Smoking status: Current Every Day Smoker  . Smokeless tobacco: Never Used  . Alcohol use Yes     Comment: socially   ROS See pertinent in HPI  Blood pressure 102/69, pulse 72, height 5\' 1"  (1.549 m), weight 225 lb 6.4 oz (102.2 kg), last menstrual period 01/29/2017. GENERAL: Well-developed, well-nourished female in no acute distress.  LUNGS: Clear to auscultation bilaterally.  HEART: Regular rate and rhythm. ABDOMEN: Soft, nontender, nondistended. Obese PELVIC: Normal external female genitalia. Vagina is pink and rugated.  Normal discharge. Normal appearing cervix. Uterus is normal in size. No adnexal mass or tenderness. EXTREMITIES: No cyanosis, clubbing, or edema, 2+ distal pulses.  A/P 46 yo with DUB - Discussed possible etiologies - Dicussed endometrial biopsy ENDOMETRIAL BIOPSY     The indications for endometrial biopsy were reviewed.   Risks of the biopsy including cramping, bleeding, infection, uterine perforation, inadequate specimen and need for additional procedures  were discussed. The patient states she understands and agrees to undergo procedure today. Consent was signed. Time out was performed. Urine HCG was negative. A sterile speculum was placed in the patient's vagina and the cervix was prepped with Betadine. A  single-toothed tenaculum was placed on the anterior lip of the cervix to stabilize it. The uterine cavity was sounded to a depth of 14 cm using the uterine sound. The 3 mm pipelle was introduced into the endometrial cavity without difficulty, 2 passes were made.  A  moderate amount of tissue was  sent to pathology. The instruments were removed from the patient's vagina. Minimal bleeding from the cervix was noted. The patient tolerated the procedure well.  Routine post-procedure instructions were given to the patient. The patient will follow up in two weeks to review the results and for further management.  - Pelvic ultrasound ordered - Discussed medical management with provera pending biopsy and ultrasound results - Referral for screening mammogram also ordered

## 2017-02-23 LAB — CERVICOVAGINAL ANCILLARY ONLY
BACTERIAL VAGINITIS: POSITIVE — AB
CANDIDA VAGINITIS: NEGATIVE
Chlamydia: NEGATIVE
NEISSERIA GONORRHEA: NEGATIVE
Trichomonas: POSITIVE — AB

## 2017-02-23 NOTE — Progress Notes (Signed)
Please inform patient of negative endometrial biopsy. I will contact her following ultrasound results.

## 2017-02-24 ENCOUNTER — Telehealth: Payer: Self-pay | Admitting: *Deleted

## 2017-02-24 ENCOUNTER — Other Ambulatory Visit: Payer: Self-pay | Admitting: Obstetrics and Gynecology

## 2017-02-24 ENCOUNTER — Telehealth: Payer: Self-pay

## 2017-02-24 LAB — POCT PREGNANCY, URINE: Preg Test, Ur: NEGATIVE

## 2017-02-24 MED ORDER — METRONIDAZOLE 500 MG PO TABS: 500.0000 mg | ORAL_TABLET | Freq: Two times a day (BID) | ORAL | 0 refills | Status: DC

## 2017-02-24 NOTE — Telephone Encounter (Signed)
Called patient and was unable to leave a voicemail, phone just rings. Will attempt to call her tomorrow and if no answer will send her an letter.

## 2017-02-24 NOTE — Telephone Encounter (Signed)
Please inform patient of BV and trich infection. Rx has been e-prescribed   Called patient left message to call us back regarding results and to pick up her prescription from [facility]

## 2017-02-24 NOTE — Telephone Encounter (Signed)
-----   Message from Mora Bellman, MD sent at 02/23/2017 12:02 PM EDT ----- Please inform patient of negative endometrial biopsy. I will contact her following ultrasound results.

## 2017-02-26 ENCOUNTER — Ambulatory Visit (HOSPITAL_COMMUNITY)
Admission: RE | Admit: 2017-02-26 | Discharge: 2017-02-26 | Disposition: A | Discharge status: Home / Self Care | Payer: BLUE CROSS/BLUE SHIELD | Source: Ambulatory Visit | Attending: Obstetrics & Gynecology | Admitting: Obstetrics & Gynecology

## 2017-02-26 DIAGNOSIS — D259 Leiomyoma of uterus, unspecified: Secondary | ICD-10-CM | POA: Diagnosis not present

## 2017-02-26 DIAGNOSIS — N938 Other specified abnormal uterine and vaginal bleeding: Secondary | ICD-10-CM | POA: Diagnosis not present

## 2017-02-26 DIAGNOSIS — R58 Hemorrhage, not elsewhere classified: Secondary | ICD-10-CM

## 2017-02-26 DIAGNOSIS — N939 Abnormal uterine and vaginal bleeding, unspecified: Secondary | ICD-10-CM | POA: Diagnosis not present

## 2017-02-26 NOTE — Telephone Encounter (Signed)
Patient inform of [place] results. STI card will be filled out. Patient informed to start medications and make sure to tell partner or partners of test results.

## 2017-03-03 ENCOUNTER — Ambulatory Visit
Admission: RE | Admit: 2017-03-03 | Discharge: 2017-03-03 | Disposition: A | Discharge status: Home / Self Care | Payer: BLUE CROSS/BLUE SHIELD | Source: Ambulatory Visit | Attending: Obstetrics and Gynecology | Admitting: Obstetrics and Gynecology

## 2017-03-03 DIAGNOSIS — Z1231 Encounter for screening mammogram for malignant neoplasm of breast: Secondary | ICD-10-CM

## 2017-03-04 ENCOUNTER — Other Ambulatory Visit: Payer: Self-pay | Admitting: Obstetrics and Gynecology

## 2017-03-04 ENCOUNTER — Encounter: Payer: Self-pay | Admitting: General Practice

## 2017-03-04 ENCOUNTER — Telehealth: Payer: Self-pay | Admitting: General Practice

## 2017-03-04 DIAGNOSIS — R928 Other abnormal and inconclusive findings on diagnostic imaging of breast: Secondary | ICD-10-CM

## 2017-03-04 NOTE — Telephone Encounter (Signed)
Per Dr Elly Modena, Please inform patient of ultrasound demonstrating multiple fibroids. She can continue treatment of her abnormal bleeding with provera. If bleeding is not well controlled after 1 month of provera usage, she may return to discuss surgical intervention. Called patient, no answer- left message to call us back concerning non urgent results. Will send letter

## 2017-03-05 ENCOUNTER — Encounter: Payer: Self-pay | Admitting: Internal Medicine

## 2017-03-06 ENCOUNTER — Encounter: Payer: Self-pay | Admitting: Internal Medicine

## 2017-03-09 ENCOUNTER — Encounter: Payer: Self-pay | Admitting: Internal Medicine

## 2017-03-11 ENCOUNTER — Ambulatory Visit
Admission: RE | Admit: 2017-03-11 | Discharge: 2017-03-11 | Disposition: A | Discharge status: Home / Self Care | Payer: BLUE CROSS/BLUE SHIELD | Source: Ambulatory Visit | Attending: Obstetrics and Gynecology | Admitting: Obstetrics and Gynecology

## 2017-03-11 ENCOUNTER — Other Ambulatory Visit: Payer: Self-pay | Admitting: Obstetrics and Gynecology

## 2017-03-11 DIAGNOSIS — N6320 Unspecified lump in the left breast, unspecified quadrant: Secondary | ICD-10-CM

## 2017-03-11 DIAGNOSIS — R928 Other abnormal and inconclusive findings on diagnostic imaging of breast: Secondary | ICD-10-CM

## 2017-03-11 DIAGNOSIS — N6489 Other specified disorders of breast: Secondary | ICD-10-CM | POA: Diagnosis not present

## 2017-04-15 ENCOUNTER — Encounter (HOSPITAL_COMMUNITY): Payer: Self-pay | Admitting: *Deleted

## 2017-04-15 ENCOUNTER — Emergency Department (HOSPITAL_COMMUNITY)
Admission: EM | Admit: 2017-04-15 | Discharge: 2017-04-15 | Disposition: A | Discharge status: Home / Self Care | Payer: BLUE CROSS/BLUE SHIELD | Attending: Emergency Medicine | Admitting: Emergency Medicine

## 2017-04-15 ENCOUNTER — Emergency Department (HOSPITAL_COMMUNITY): Payer: BLUE CROSS/BLUE SHIELD

## 2017-04-15 DIAGNOSIS — S199XXA Unspecified injury of neck, initial encounter: Secondary | ICD-10-CM | POA: Diagnosis present

## 2017-04-15 DIAGNOSIS — S299XXA Unspecified injury of thorax, initial encounter: Secondary | ICD-10-CM | POA: Diagnosis not present

## 2017-04-15 DIAGNOSIS — Y929 Unspecified place or not applicable: Secondary | ICD-10-CM | POA: Insufficient documentation

## 2017-04-15 DIAGNOSIS — F17210 Nicotine dependence, cigarettes, uncomplicated: Secondary | ICD-10-CM | POA: Insufficient documentation

## 2017-04-15 DIAGNOSIS — Y9389 Activity, other specified: Secondary | ICD-10-CM | POA: Diagnosis not present

## 2017-04-15 DIAGNOSIS — Y999 Unspecified external cause status: Secondary | ICD-10-CM | POA: Diagnosis not present

## 2017-04-15 DIAGNOSIS — I10 Essential (primary) hypertension: Secondary | ICD-10-CM | POA: Insufficient documentation

## 2017-04-15 DIAGNOSIS — S161XXA Strain of muscle, fascia and tendon at neck level, initial encounter: Secondary | ICD-10-CM | POA: Diagnosis not present

## 2017-04-15 DIAGNOSIS — M542 Cervicalgia: Secondary | ICD-10-CM | POA: Diagnosis not present

## 2017-04-15 DIAGNOSIS — S4992XA Unspecified injury of left shoulder and upper arm, initial encounter: Secondary | ICD-10-CM | POA: Diagnosis not present

## 2017-04-15 DIAGNOSIS — Z79899 Other long term (current) drug therapy: Secondary | ICD-10-CM | POA: Insufficient documentation

## 2017-04-15 MED ORDER — IBUPROFEN 600 MG PO TABS: 600.0000 mg | ORAL_TABLET | Freq: Four times a day (QID) | ORAL | 0 refills | Status: DC | PRN

## 2017-04-15 MED ORDER — IBUPROFEN 400 MG PO TABS
600.0000 mg | ORAL_TABLET | Freq: Once | ORAL | Status: AC
  Administered 2017-04-15: 10:00:00 600 mg via ORAL
  Filled 2017-04-15: qty 1

## 2017-04-15 MED ORDER — CYCLOBENZAPRINE HCL 10 MG PO TABS: 10.0000 mg | ORAL_TABLET | Freq: Two times a day (BID) | ORAL | 0 refills | Status: DC | PRN
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
## 2017-04-15 NOTE — Discharge Instructions (Signed)
We saw you in the ER for the neck pain and some left sided back discomfort. Our imaging is normal in the ER. We think you are having a cervical sprain/spasms, however, to be absolutely sure we are not missing a significant ligament injury - we are sending you home with a cervical collar. Keep the collar on until the pain ceases, at which point you can take the collar off. If the symptoms get worse, you start having numbness, tingling, weakness in your arms or hands, return to the ER right away. MAKE SURE YOU PERIODICALLY REMOVE THE COLLAR AND MOVE THE NECK TO PREVENT FROZEN NECK.  If the symptoms don't improve in 1 week, call primary doctor for a follow up.

## 2017-04-15 NOTE — ED Notes (Signed)
PT provided with philly collar and instructions to wear for protection and remove periodically. PT shown how to apply but states she does not want to wear collar right now and will take with her. Dr. Kathrynn Humble aware

## 2017-04-30 ENCOUNTER — Ambulatory Visit: Payer: BLUE CROSS/BLUE SHIELD | Attending: Internal Medicine | Admitting: Physician Assistant

## 2017-04-30 ENCOUNTER — Encounter: Payer: Self-pay | Admitting: Physician Assistant

## 2017-04-30 DIAGNOSIS — M62838 Other muscle spasm: Secondary | ICD-10-CM

## 2017-04-30 DIAGNOSIS — I10 Essential (primary) hypertension: Secondary | ICD-10-CM | POA: Insufficient documentation

## 2017-04-30 DIAGNOSIS — S161XXA Strain of muscle, fascia and tendon at neck level, initial encounter: Secondary | ICD-10-CM | POA: Insufficient documentation

## 2017-04-30 DIAGNOSIS — Z7984 Long term (current) use of oral hypoglycemic drugs: Secondary | ICD-10-CM | POA: Diagnosis not present

## 2017-04-30 DIAGNOSIS — Z79899 Other long term (current) drug therapy: Secondary | ICD-10-CM | POA: Insufficient documentation

## 2017-04-30 DIAGNOSIS — E669 Obesity, unspecified: Secondary | ICD-10-CM | POA: Diagnosis not present

## 2017-04-30 MED ORDER — IBUPROFEN 600 MG PO TABS: ORAL_TABLET | ORAL | 0 refills | Status: DC

## 2017-04-30 MED ORDER — METHOCARBAMOL 500 MG PO TABS: 500.0000 mg | ORAL_TABLET | Freq: Three times a day (TID) | ORAL | 0 refills | Status: DC
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
## 2017-04-30 NOTE — Patient Instructions (Signed)
Make f/up appt with Dr Mora Bellman at [facility] for [facility]
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
## 2017-05-05 ENCOUNTER — Encounter: Payer: Self-pay | Admitting: Family Medicine

## 2017-05-05 ENCOUNTER — Ambulatory Visit: Payer: BLUE CROSS/BLUE SHIELD | Attending: Family Medicine | Admitting: Family Medicine

## 2017-05-05 VITALS — BP 92/59 | HR 81 | Temp 98.3°F | Resp 18 | Ht 61.0 in | Wt 232.4 lb

## 2017-05-05 DIAGNOSIS — S161XXA Strain of muscle, fascia and tendon at neck level, initial encounter: Secondary | ICD-10-CM | POA: Diagnosis not present

## 2017-05-05 DIAGNOSIS — Z862 Personal history of diseases of the blood and blood-forming organs and certain disorders involving the immune mechanism: Secondary | ICD-10-CM | POA: Diagnosis not present

## 2017-05-05 DIAGNOSIS — Z79899 Other long term (current) drug therapy: Secondary | ICD-10-CM | POA: Diagnosis not present

## 2017-05-05 DIAGNOSIS — I10 Essential (primary) hypertension: Secondary | ICD-10-CM

## 2017-05-05 DIAGNOSIS — R7303 Prediabetes: Secondary | ICD-10-CM

## 2017-05-05 DIAGNOSIS — Z7984 Long term (current) use of oral hypoglycemic drugs: Secondary | ICD-10-CM | POA: Diagnosis not present

## 2017-05-05 DIAGNOSIS — M542 Cervicalgia: Secondary | ICD-10-CM | POA: Diagnosis not present

## 2017-05-05 DIAGNOSIS — Z8639 Personal history of other endocrine, nutritional and metabolic disease: Secondary | ICD-10-CM

## 2017-05-05 DIAGNOSIS — F17210 Nicotine dependence, cigarettes, uncomplicated: Secondary | ICD-10-CM | POA: Diagnosis not present

## 2017-05-05 DIAGNOSIS — D509 Iron deficiency anemia, unspecified: Secondary | ICD-10-CM | POA: Diagnosis not present

## 2017-05-05 DIAGNOSIS — M62838 Other muscle spasm: Secondary | ICD-10-CM

## 2017-05-05 DIAGNOSIS — E559 Vitamin D deficiency, unspecified: Secondary | ICD-10-CM | POA: Insufficient documentation

## 2017-05-05 DIAGNOSIS — X58XXXA Exposure to other specified factors, initial encounter: Secondary | ICD-10-CM | POA: Diagnosis not present

## 2017-05-05 LAB — POCT GLYCOSYLATED HEMOGLOBIN (HGB A1C): Hemoglobin A1C: 5.5

## 2017-05-05 MED ORDER — METHOCARBAMOL 500 MG PO TABS: 500.0000 mg | ORAL_TABLET | Freq: Three times a day (TID) | ORAL | 0 refills | Status: DC | PRN

## 2017-05-05 MED ORDER — LISINOPRIL-HYDROCHLOROTHIAZIDE 20-25 MG PO TABS: 1.0000 | ORAL_TABLET | Freq: Every day | ORAL | 3 refills | Status: DC

## 2017-05-05 MED ORDER — IBUPROFEN 600 MG PO TABS: 600.0000 mg | ORAL_TABLET | Freq: Three times a day (TID) | ORAL | 0 refills | Status: DC | PRN

## 2017-05-05 MED ORDER — METFORMIN HCL 500 MG PO TABS: 500.0000 mg | ORAL_TABLET | Freq: Every day | ORAL | 3 refills | Status: DC

## 2017-05-05 MED FILL — LISINOPRIL-HCTZ 20-25 MG TA: 20-25 | 90 days supply | Qty: 90 | Fill #0

## 2017-05-05 NOTE — Progress Notes (Signed)
Patient is here for establish care / HTN

## 2017-05-05 NOTE — Patient Instructions (Signed)
Schedule walk in lab appointment for labs draws.  DASH Eating Plan DASH stands for "Dietary Approaches to Stop Hypertension." The DASH eating plan is a healthy eating plan that has been shown to reduce high blood pressure (hypertension). It may also reduce your risk for type 2 diabetes, heart disease, and stroke. The DASH eating plan may also help with weight loss. What are tips for following this plan? General guidelines  Avoid eating more than 2,300 mg (milligrams) of salt (sodium) a day. If you have hypertension, you may need to reduce your sodium intake to 1,500 mg a day.  Limit alcohol intake to no more than 1 drink a day for nonpregnant women and 2 drinks a day for men. One drink equals 12 oz of beer, 5 oz of wine, or 1 oz of hard liquor.  Work with your health care provider to maintain a healthy body weight or to lose weight. Ask what an ideal weight is for you.  Get at least 30 minutes of exercise that causes your heart to beat faster (aerobic exercise) most days of the week. Activities may include walking, swimming, or biking.  Work with your health care provider or diet and nutrition specialist (dietitian) to adjust your eating plan to your individual calorie needs. Reading food labels  Check food labels for the amount of sodium per serving. Choose foods with less than 5 percent of the Daily Value of sodium. Generally, foods with less than 300 mg of sodium per serving fit into this eating plan.  To find whole grains, look for the word "whole" as the first word in the ingredient list. Shopping  Buy products labeled as "low-sodium" or "no salt added."  Buy fresh foods. Avoid canned foods and premade or frozen meals. Cooking  Avoid adding salt when cooking. Use salt-free seasonings or herbs instead of table salt or sea salt. Check with your health care provider or pharmacist before using salt substitutes.  Do not fry foods. Cook foods using healthy methods such as baking,  boiling, grilling, and broiling instead.  Cook with heart-healthy oils, such as olive, canola, soybean, or sunflower oil. Meal planning   Eat a balanced diet that includes: ? 5 or more servings of fruits and vegetables each day. At each meal, try to fill half of your plate with fruits and vegetables. ? Up to 6-8 servings of whole grains each day. ? Less than 6 oz of lean meat, poultry, or fish each day. A 3-oz serving of meat is about the same size as a deck of cards. One egg equals 1 oz. ? 2 servings of low-fat dairy each day. ? A serving of nuts, seeds, or beans 5 times each week. ? Heart-healthy fats. Healthy fats called Omega-3 fatty acids are found in foods such as flaxseeds and coldwater fish, like sardines, salmon, and mackerel.  Limit how much you eat of the following: ? Canned or prepackaged foods. ? Food that is high in trans fat, such as fried foods. ? Food that is high in saturated fat, such as fatty meat. ? Sweets, desserts, sugary drinks, and other foods with added sugar. ? Full-fat dairy products.  Do not salt foods before eating.  Try to eat at least 2 vegetarian meals each week.  Eat more home-cooked food and less restaurant, buffet, and fast food.  When eating at a restaurant, ask that your food be prepared with less salt or no salt, if possible. What foods are recommended? The items listed may not be  a complete list. Talk with your dietitian about what dietary choices are best for you. Grains Whole-grain or whole-wheat bread. Whole-grain or whole-wheat pasta. Brown rice. [place]. Bulgur. Whole-grain and low-sodium cereals. Pita bread. Low-fat, low-sodium crackers. Whole-wheat flour tortillas. Vegetables Fresh or frozen vegetables (raw, steamed, roasted, or grilled). Low-sodium or reduced-sodium tomato and vegetable juice. Low-sodium or reduced-sodium tomato sauce and tomato paste. Low-sodium or reduced-sodium canned vegetables. Fruits All fresh, dried, or  frozen fruit. Canned fruit in natural juice (without added sugar). Meat and other protein foods Skinless chicken or [place]. Ground chicken or [place]. Pork with fat trimmed off. Fish and seafood. Egg whites. Dried beans, peas, or lentils. Unsalted nuts, nut butters, and seeds. Unsalted canned beans. Lean cuts of beef with fat trimmed off. Low-sodium, lean deli meat. Dairy Low-fat (1%) or fat-free (skim) milk. Fat-free, low-fat, or reduced-fat cheeses. Nonfat, low-sodium ricotta or cottage cheese. Low-fat or nonfat yogurt. Low-fat, low-sodium cheese. Fats and oils Soft margarine without trans fats. Vegetable oil. Low-fat, reduced-fat, or light mayonnaise and salad dressings (reduced-sodium). Canola, safflower, olive, soybean, and sunflower oils. Avocado. Seasoning and other foods Herbs. Spices. Seasoning mixes without salt. Unsalted popcorn and pretzels. Fat-free sweets. What foods are not recommended? The items listed may not be a complete list. Talk with your dietitian about what dietary choices are best for you. Grains Baked goods made with fat, such as croissants, muffins, or some breads. Dry pasta or rice meal packs. Vegetables Creamed or fried vegetables. Vegetables in a cheese sauce. Regular canned vegetables (not low-sodium or reduced-sodium). Regular canned tomato sauce and paste (not low-sodium or reduced-sodium). Regular tomato and vegetable juice (not low-sodium or reduced-sodium). Angie Fava. Olives. Fruits Canned fruit in a light or heavy syrup. Fried fruit. Fruit in cream or butter sauce. Meat and other protein foods Fatty cuts of meat. Ribs. Fried meat. Berniece Salines. Sausage. Bologna and other processed lunch meats. Salami. Fatback. Hotdogs. Bratwurst. Salted nuts and seeds. Canned beans with added salt. Canned or smoked fish. Whole eggs or egg yolks. Chicken or [place] with skin. Dairy Whole or 2% milk, cream, and half-and-half. Whole or full-fat cream cheese. Whole-fat or sweetened yogurt.  Full-fat cheese. Nondairy creamers. Whipped toppings. Processed cheese and cheese spreads. Fats and oils Butter. Stick margarine. Lard. Shortening. Ghee. Bacon fat. Tropical oils, such as coconut, palm kernel, or palm oil. Seasoning and other foods Salted popcorn and pretzels. Onion salt, garlic salt, seasoned salt, table salt, and sea salt. Worcestershire sauce. Tartar sauce. Barbecue sauce. Teriyaki sauce. Soy sauce, including reduced-sodium. Steak sauce. Canned and packaged gravies. Fish sauce. Oyster sauce. Cocktail sauce. Horseradish that you find on the shelf. Ketchup. Mustard. Meat flavorings and tenderizers. Bouillon cubes. Hot sauce and Tabasco sauce. Premade or packaged marinades. Premade or packaged taco seasonings. Relishes. Regular salad dressings. Where to find more information:  National Heart, Lung, and [place]: https://Strike-eaton.com/  American Heart Association: www.heart.org Summary  The DASH eating plan is a healthy eating plan that has been shown to reduce high blood pressure (hypertension). It may also reduce your risk for type 2 diabetes, heart disease, and stroke.  With the DASH eating plan, you should limit salt (sodium) intake to 2,300 mg a day. If you have hypertension, you may need to reduce your sodium intake to 1,500 mg a day.  When on the DASH eating plan, aim to eat more fresh fruits and vegetables, whole grains, lean proteins, low-fat dairy, and heart-healthy fats.  Work with your health care provider or diet and nutrition specialist (  dietitian) to adjust your eating plan to your individual calorie needs. This information is not intended to replace advice given to you by your health care provider. Make sure you discuss any questions you have with your health care provider. Document Released: 09/25/2011 Document Revised: 09/29/2016 Document Reviewed: 09/29/2016 Elsevier Interactive Patient Education  2017 [employer].
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
## 2017-05-10 ENCOUNTER — Other Ambulatory Visit: Payer: Self-pay | Admitting: Family Medicine

## 2017-05-10 DIAGNOSIS — I10 Essential (primary) hypertension: Secondary | ICD-10-CM

## 2017-05-12 MED FILL — IBUPROFEN 600 MG TABLET: 600 | 13 days supply | Qty: 40 | Fill #0

## 2017-05-12 MED FILL — ?METFORMIN HCL 500MG TABLET: 500 | 60 days supply | Qty: 60 | Fill #0

## 2017-05-14 ENCOUNTER — Ambulatory Visit: Payer: BLUE CROSS/BLUE SHIELD | Attending: Family Medicine

## 2017-05-14 DIAGNOSIS — I10 Essential (primary) hypertension: Secondary | ICD-10-CM | POA: Diagnosis not present

## 2017-05-14 DIAGNOSIS — Z862 Personal history of diseases of the blood and blood-forming organs and certain disorders involving the immune mechanism: Secondary | ICD-10-CM

## 2017-05-14 DIAGNOSIS — Z8639 Personal history of other endocrine, nutritional and metabolic disease: Secondary | ICD-10-CM

## 2017-05-15 LAB — IRON,TIBC AND FERRITIN PANEL
Ferritin: 37 ng/mL (ref 15–150)
IRON SATURATION: 13 % — AB (ref 15–55)
Iron: 44 ug/dL (ref 27–159)
Total Iron Binding Capacity: 342 ug/dL (ref 250–450)
UIBC: 298 ug/dL (ref 131–425)

## 2017-05-15 LAB — CBC WITH DIFFERENTIAL/PLATELET
BASOS: 0 %
Basophils Absolute: 0 10*3/uL (ref 0.0–0.2)
EOS (ABSOLUTE): 0.2 10*3/uL (ref 0.0–0.4)
Eos: 2 %
Hematocrit: 39 % (ref 34.0–46.6)
Hemoglobin: 12.5 g/dL (ref 11.1–15.9)
Immature Grans (Abs): 0 10*3/uL (ref 0.0–0.1)
Immature Granulocytes: 0 %
LYMPHS ABS: 2.1 10*3/uL (ref 0.7–3.1)
Lymphs: 22 %
MCH: 30 pg (ref 26.6–33.0)
MCHC: 32.1 g/dL (ref 31.5–35.7)
MCV: 94 fL (ref 79–97)
MONOS ABS: 0.5 10*3/uL (ref 0.1–0.9)
Monocytes: 5 %
NEUTROS ABS: 6.9 10*3/uL (ref 1.4–7.0)
Neutrophils: 71 %
Platelets: 395 10*3/uL — ABNORMAL HIGH (ref 150–379)
RBC: 4.17 x10E6/uL (ref 3.77–5.28)
RDW: 13.6 % (ref 12.3–15.4)
WBC: 9.7 10*3/uL (ref 3.4–10.8)

## 2017-05-15 LAB — VITAMIN D 25 HYDROXY (VIT D DEFICIENCY, FRACTURES): Vit D, 25-Hydroxy: 27 ng/mL — ABNORMAL LOW (ref 30.0–100.0)

## 2017-05-15 LAB — LIPID PANEL
CHOLESTEROL TOTAL: 231 mg/dL — AB (ref 100–199)
Chol/HDL Ratio: 3.2 ratio (ref 0.0–4.4)
HDL: 72 mg/dL (ref >39)
LDL CALC: 141 mg/dL — AB (ref 0–99)
Triglycerides: 91 mg/dL (ref 0–149)
VLDL CHOLESTEROL CAL: 18 mg/dL (ref 5–40)

## 2017-05-15 LAB — MICROALBUMIN / CREATININE URINE RATIO
Creatinine, Urine: 98.4 mg/dL
MICROALBUM., U, RANDOM: 11.6 ug/mL
Microalb/Creat Ratio: 11.8 mg/g creat (ref 0.0–30.0)

## 2017-05-22 ENCOUNTER — Ambulatory Visit: Payer: BLUE CROSS/BLUE SHIELD | Attending: Family Medicine | Admitting: Family Medicine

## 2017-05-22 ENCOUNTER — Other Ambulatory Visit: Payer: Self-pay | Admitting: Family Medicine

## 2017-05-22 VITALS — BP 93/61 | HR 78 | Temp 98.1°F | Resp 18 | Ht 61.0 in | Wt 225.0 lb

## 2017-05-22 DIAGNOSIS — E559 Vitamin D deficiency, unspecified: Secondary | ICD-10-CM

## 2017-05-22 DIAGNOSIS — S134XXS Sprain of ligaments of cervical spine, sequela: Secondary | ICD-10-CM | POA: Diagnosis not present

## 2017-05-22 DIAGNOSIS — S139XXS Sprain of joints and ligaments of unspecified parts of neck, sequela: Secondary | ICD-10-CM | POA: Diagnosis not present

## 2017-05-22 DIAGNOSIS — M791 Myalgia: Secondary | ICD-10-CM | POA: Diagnosis not present

## 2017-05-22 DIAGNOSIS — X58XXXS Exposure to other specified factors, sequela: Secondary | ICD-10-CM | POA: Insufficient documentation

## 2017-05-22 DIAGNOSIS — Z7984 Long term (current) use of oral hypoglycemic drugs: Secondary | ICD-10-CM | POA: Insufficient documentation

## 2017-05-22 DIAGNOSIS — E785 Hyperlipidemia, unspecified: Secondary | ICD-10-CM | POA: Diagnosis not present

## 2017-05-22 DIAGNOSIS — I10 Essential (primary) hypertension: Secondary | ICD-10-CM

## 2017-05-22 DIAGNOSIS — E782 Mixed hyperlipidemia: Secondary | ICD-10-CM

## 2017-05-22 DIAGNOSIS — M62838 Other muscle spasm: Secondary | ICD-10-CM

## 2017-05-22 DIAGNOSIS — M7918 Myalgia, other site: Secondary | ICD-10-CM

## 2017-05-22 MED ORDER — TRAMADOL HCL 50 MG PO TABS: 50.0000 mg | ORAL_TABLET | Freq: Three times a day (TID) | ORAL | 0 refills | Status: DC | PRN

## 2017-05-22 MED ORDER — CYCLOBENZAPRINE HCL 10 MG PO TABS: 10.0000 mg | ORAL_TABLET | Freq: Three times a day (TID) | ORAL | 0 refills | Status: DC | PRN

## 2017-05-22 MED ORDER — IBUPROFEN 800 MG PO TABS: 800.0000 mg | ORAL_TABLET | Freq: Three times a day (TID) | ORAL | 0 refills | Status: DC | PRN

## 2017-05-22 MED ORDER — VITAMIN D (ERGOCALCIFEROL) 1.25 MG (50000 UNIT) PO CAPS
ORAL_CAPSULE | ORAL | 0 refills | Status: DC
Start: 2017-05-22 — End: 2018-01-01

## 2017-05-22 MED ORDER — PRAVASTATIN SODIUM 40 MG PO TABS: 40.0000 mg | ORAL_TABLET | Freq: Every day | ORAL | 3 refills | Status: DC

## 2017-05-22 NOTE — Patient Instructions (Signed)
Cervical Sprain A cervical sprain is a stretch or tear in the tissues that connect bones (ligaments) in the neck. Most neck (cervical) sprains get better in 4-6 weeks. Follow these instructions at home: If you have a neck collar:  Wear it as told by your doctor. Do not take off (do not remove) the collar unless your doctor says that this is safe.  Ask your doctor before adjusting your collar.  If you have long hair, keep it outside of the collar.  Ask your doctor if you may take off the collar for cleaning and bathing. If you may take off the collar:  Follow instructions from your doctor about how to take off the collar safely.  Clean the collar by wiping it with mild soap and water. Let it air-dry all the way.  If your collar has removable pads:  Take the pads out every 1-2 days.  Hand wash the pads with soap and water.  Let the pads air-dry all the way before you put them back in the collar. Do not dry them in a clothes dryer. Do not dry them with a hair dryer.  Check your skin under the collar for irritation or sores. If you see any, tell your doctor. Managing pain, stiffness, and swelling  Use a cervical traction device, if told by your doctor.  If told, put heat on the affected area. Do this before exercises (physical therapy) or as often as told by your doctor. Use the heat source that your doctor recommends, such as a moist heat pack or a heating pad.  Place a towel between your skin and the heat source.  Leave the heat on for 20-30 minutes.  Take the heat off (remove the heat) if your skin turns bright red. This is very important if you cannot feel pain, heat, or cold. You may have a greater risk of getting burned.  Put ice on the affected area.  Put ice in a plastic bag.  Place a towel between your skin and the bag.  Leave the ice on for 20 minutes, 2-3 times a day. Activity  Do not drive while wearing a neck collar. If you do not have a neck collar, ask your  doctor if it is safe to drive.  Do not drive or use heavy machinery while taking prescription pain medicine or muscle relaxants, unless your doctor approves.  Do not lift anything that is heavier than 10 lb (4.5 kg) until your doctor tells you that it is safe.  Rest as told by your doctor.  Avoid activities that make you feel worse. Ask your doctor what activities are safe for you.  Do exercises as told by your doctor or physical therapist. Preventing neck sprain  Practice good posture. Adjust your workstation to help with this, if needed.  Exercise regularly as told by your doctor or physical therapist.  Avoid activities that are risky or may cause a neck sprain (cervical sprain). General instructions  Take over-the-counter and prescription medicines only as told by your doctor.  Do not use any products that contain nicotine or tobacco. This includes cigarettes and e-cigarettes. If you need help quitting, ask your doctor.  Keep all follow-up visits as told by your doctor. This is important. Contact a doctor if:  You have pain or other symptoms that get worse.  You have symptoms that do not get better after 2 weeks.  You have pain that does not get better with medicine.  You start to have new,   unexplained symptoms.  You have sores or irritated skin from wearing your neck collar. Get help right away if:  You have very bad pain.  You have any of the following in any part of your body:  Loss of feeling (numbness).  Tingling.  Weakness.  You cannot move a part of your body (you have paralysis).  Your activity level does not improve. Summary  A cervical sprain is a stretch or tear in the tissues that connect bones (ligaments) in the neck.  If you have a neck (cervical) collar, do not take off the collar unless your doctor says that this is safe.  Put ice on affected areas as told by your doctor.  Put heat on affected areas as told by your doctor.  Good posture  and regular exercise can help prevent a neck sprain from happening again. This information is not intended to replace advice given to you by your health care provider. Make sure you discuss any questions you have with your health care provider. Document Released: 03/24/2008 Document Revised: 06/17/2016 Document Reviewed: 06/17/2016 Elsevier Interactive Patient Education  2017 [employer]  
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
## 2017-05-25 ENCOUNTER — Telehealth: Payer: Self-pay

## 2017-05-25 NOTE — Telephone Encounter (Signed)
CMA call regarding lab results   Patient verify DOB  Patient was aware and understood  

## 2017-05-25 NOTE — Telephone Encounter (Signed)
-----   Message from Alfonse Spruce, [place] sent at 05/22/2017 10:50 AM EDT ----- -Lipid levels were elevated. This can increase your risk of heart disease. Your dose of pravastatin will be increased. -Start eating a diet low in saturated fat. Limit your intake of fried foods, red meats, and whole milk. Increase activity. Kidney function normal Anemia and Iron levels have improved. Continue daily iron supplement.  Vitamin D level was low. Vitamin D helps to keep bones strong. You were prescribed ergocalciferol (capsules) to increase your vitamin-d level.

## 2017-06-09 MED FILL — traMADol HCL 50 MG TABS: 50 | 10 days supply | Qty: 30 | Fill #0

## 2017-06-09 MED FILL — PRAVASTATIN NA 40 MG TAB: 40 | 30 days supply | Qty: 30 | Fill #0

## 2017-06-09 MED FILL — IBUPROFEN 800 MG TABLET: 800 | 10 days supply | Qty: 30 | Fill #0

## 2017-06-09 MED FILL — ?CYCLOBENZAPRINE 10 MG TABL: 10 | 10 days supply | Qty: 30 | Fill #0

## 2017-06-09 MED FILL — VIT D2 1.25 MG (50,000 UNIT: 1.25 MG | 28 days supply | Qty: 4 | Fill #0
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
## 2017-08-04 MED FILL — ?METFORMIN HCL 500MG TABLET: 500 | 30 days supply | Qty: 30 | Fill #1

## 2017-08-04 MED FILL — VIT D2 1.25 MG (50,000 UNIT: 1.25 MG | 28 days supply | Qty: 4 | Fill #1

## 2017-08-04 MED FILL — ?PRAVASTATIN SODIUM 40MG TA: 40 | 30 days supply | Qty: 30 | Fill #1

## 2017-08-04 MED FILL — LISINOPRIL-HCTZ 20-25 MG TA: 20-25 | 30 days supply | Qty: 30 | Fill #1

## 2017-08-24 ENCOUNTER — Ambulatory Visit: Payer: Self-pay | Admitting: Family Medicine

## 2017-09-14 ENCOUNTER — Ambulatory Visit
Admission: RE | Admit: 2017-09-14 | Discharge: 2017-09-14 | Disposition: A | Discharge status: Home / Self Care | Payer: BLUE CROSS/BLUE SHIELD | Source: Ambulatory Visit | Attending: Obstetrics and Gynecology | Admitting: Obstetrics and Gynecology

## 2017-09-14 DIAGNOSIS — R928 Other abnormal and inconclusive findings on diagnostic imaging of breast: Secondary | ICD-10-CM | POA: Diagnosis not present

## 2017-09-14 DIAGNOSIS — N6320 Unspecified lump in the left breast, unspecified quadrant: Secondary | ICD-10-CM

## 2017-10-07 ENCOUNTER — Other Ambulatory Visit: Payer: Self-pay | Admitting: Family Medicine

## 2017-10-07 DIAGNOSIS — E559 Vitamin D deficiency, unspecified: Secondary | ICD-10-CM

## 2017-11-10 ENCOUNTER — Other Ambulatory Visit: Payer: Self-pay | Admitting: Family Medicine

## 2017-11-10 DIAGNOSIS — E559 Vitamin D deficiency, unspecified: Secondary | ICD-10-CM

## 2017-11-10 MED FILL — ?PRAVASTATIN SODIUM 40MG TA: 40 | 30 days supply | Qty: 30 | Fill #2

## 2017-11-10 MED FILL — LISINOPRIL-HCTZ 20-25 MG TA: 20-25 | 30 days supply | Qty: 30 | Fill #2

## 2017-11-10 MED FILL — ?METFORMIN HCL 500MG TABLET: 500 | 30 days supply | Qty: 30 | Fill #2

## 2017-12-18 MED FILL — PRAVASTATIN NA 40 MG TAB: 40 | 30 days supply | Qty: 30 | Fill #3

## 2017-12-18 MED FILL — metFORMIN HCL 500 MG TABS: 500 | 30 days supply | Qty: 30 | Fill #3

## 2017-12-18 MED FILL — LISINOPRIL-HCTZ 20-25 MG TA: 20-25 | 30 days supply | Qty: 30 | Fill #3

## 2017-12-31 ENCOUNTER — Ambulatory Visit: Payer: BLUE CROSS/BLUE SHIELD | Attending: Family Medicine | Admitting: Physician Assistant

## 2017-12-31 VITALS — BP 107/71 | HR 74 | Temp 98.8°F | Resp 18 | Ht 61.0 in | Wt 228.0 lb

## 2017-12-31 DIAGNOSIS — D509 Iron deficiency anemia, unspecified: Secondary | ICD-10-CM

## 2017-12-31 DIAGNOSIS — E559 Vitamin D deficiency, unspecified: Secondary | ICD-10-CM | POA: Diagnosis not present

## 2017-12-31 DIAGNOSIS — Z7984 Long term (current) use of oral hypoglycemic drugs: Secondary | ICD-10-CM | POA: Insufficient documentation

## 2017-12-31 DIAGNOSIS — Z79899 Other long term (current) drug therapy: Secondary | ICD-10-CM | POA: Diagnosis not present

## 2017-12-31 DIAGNOSIS — R7303 Prediabetes: Secondary | ICD-10-CM

## 2017-12-31 DIAGNOSIS — Z791 Long term (current) use of non-steroidal anti-inflammatories (NSAID): Secondary | ICD-10-CM | POA: Insufficient documentation

## 2017-12-31 DIAGNOSIS — I10 Essential (primary) hypertension: Secondary | ICD-10-CM

## 2017-12-31 DIAGNOSIS — E669 Obesity, unspecified: Secondary | ICD-10-CM | POA: Insufficient documentation

## 2017-12-31 DIAGNOSIS — Z79891 Long term (current) use of opiate analgesic: Secondary | ICD-10-CM | POA: Insufficient documentation

## 2017-12-31 DIAGNOSIS — E782 Mixed hyperlipidemia: Secondary | ICD-10-CM | POA: Diagnosis not present

## 2017-12-31 MED ORDER — LISINOPRIL-HYDROCHLOROTHIAZIDE 20-25 MG PO TABS: 1.0000 | ORAL_TABLET | Freq: Every day | ORAL | 3 refills | Status: DC

## 2017-12-31 MED ORDER — METFORMIN HCL 500 MG PO TABS
500.0000 mg | ORAL_TABLET | Freq: Every day | ORAL | 3 refills | Status: DC
Start: 2017-12-31 — End: 2018-01-01

## 2017-12-31 MED ORDER — FLUTICASONE PROPIONATE 50 MCG/ACT NA SUSP: 2.0000 | Freq: Every day | NASAL | 6 refills | Status: DC

## 2017-12-31 MED ORDER — PRAVASTATIN SODIUM 40 MG PO TABS: 40.0000 mg | ORAL_TABLET | Freq: Every day | ORAL | 3 refills | Status: DC

## 2017-12-31 NOTE — Progress Notes (Signed)
Patient ID: Latoya Juarez, female   DOB: [birth year], 47 y.o.   MRN: [id number]   Latoya Juarez, is a 47 y.o. female  ZRA:[id number]  [id number]:[id number]  DOB - [birth year]  Subjective:  Chief Complaint and HPI: Latoya Juarez is a 47 y.o. female here today for med RF.  Doing well.  No problems.    BP and blood sugars are normal  ROS:   Constitutional:  No f/c, No night sweats, No unexplained weight loss. EENT:  No vision changes, No blurry vision, No hearing changes. No mouth, throat, or ear problems.  Respiratory: No cough, No SOB Cardiac: No CP, no palpitations GI:  No abd pain, No N/V/D. GU: No Urinary s/sx Musculoskeletal: No joint pain Neuro: occasional sinus headache, no dizziness, no motor weakness.  Skin: No rash Endocrine:  No polydipsia. No polyuria.  Psych: Denies SI/HI  No problems updated.  ALLERGIES: No Known Allergies  PAST MEDICAL HISTORY: Past Medical History:  Diagnosis Date  . Obesity     MEDICATIONS AT HOME: Prior to Admission medications   Medication Sig Start Date End Date Taking? Authorizing Provider  ferrous gluconate (FERGON) 324 MG tablet Take 1 tablet (324 mg total) by mouth daily with breakfast. Patient taking differently: Take 324 mg by mouth 3 (three) times daily with meals.  02/04/17  Yes Langeland, Dawn T, MD  ibuprofen (ADVIL,MOTRIN) 800 MG tablet Take 1 tablet (800 mg total) by mouth every 8 (eight) hours as needed (Take with food.). 05/22/17  Yes Hairston, Maylon Peppers, FNP  traMADol (ULTRAM) 50 MG tablet Take 1 tablet (50 mg total) by mouth every 8 (eight) hours as needed. 05/22/17  Yes Hairston, Maylon Peppers, FNP  Vitamin D, Ergocalciferol, (DRISDOL) 50000 units CAPS capsule TAKE ONE CAPSULE BY MOUTH ONCE A WEEK FOR 8 WEEKS. 05/22/17  Yes Hairston, Mandesia R, FNP  lisinopril-hydrochlorothiazide (PRINZIDE,ZESTORETIC) 20-25 MG tablet Take 1 tablet by mouth daily. 12/31/17   Argentina Donovan, PA-C  metFORMIN (GLUCOPHAGE) 500 MG tablet Take 1 tablet (500  mg total) by mouth daily with breakfast. 12/31/17   Argentina Donovan, PA-C  pravastatin (PRAVACHOL) 40 MG tablet Take 1 tablet (40 mg total) by mouth daily. 12/31/17   Argentina Donovan, PA-C     Objective:  EXAM:   Vitals:   12/31/17 0844  BP: 107/71  Pulse: 74  Resp: 18  Temp: 98.8 F (37.1 C)  TempSrc: Oral  SpO2: 98%  Weight: 228 lb (103.4 kg)  Height: 5\' 1"  (1.549 m)    General appearance : A&OX3. NAD. Non-toxic-appearing HEENT: Atraumatic and Normocephalic.  PERRLA. EOM intact.  TM clear B. Mouth-MMM, post pharynx WNL w/o erythema, No PND. Neck: supple, no JVD. No cervical lymphadenopathy. No thyromegaly Chest/Lungs:  Breathing-non-labored, Good air entry bilaterally, breath sounds normal without rales, rhonchi, or wheezing  CVS: S1 S2 regular, no murmurs, gallops, rubs  Abdomen: Bowel sounds present, Non tender and not distended with no gaurding, rigidity or rebound. Extremities: Bilateral Lower Ext shows no edema, both legs are warm to touch with = pulse throughout Neurology:  CN II-XII grossly intact, Non focal.   Psych:  TP linear. J/I WNL. Normal speech. Appropriate eye contact and affect.  Skin:  No Rash  Data Review Lab Results  Component Value Date   HGBA1C 5.5 05/05/2017   HGBA1C 6.0 06/17/2016     Assessment & Plan   1. Essential hypertension Controlled. Continue current regimen - lisinopril-hydrochlorothiazide (PRINZIDE,ZESTORETIC) 20-25 MG tablet; Take 1 tablet by mouth daily.  Dispense: 90 tablet; Refill: 3 - Comprehensive metabolic panel  2. Prediabetes contolled - metFORMIN (GLUCOPHAGE) 500 MG tablet; Take 1 tablet (500 mg total) by mouth daily with breakfast.  Dispense: 90 tablet; Refill: 3 - Hemoglobin A1c  3. Mixed hyperlipidemia controlled - pravastatin (PRAVACHOL) 40 MG tablet; Take 1 tablet (40 mg total) by mouth daily.  Dispense: 90 tablet; Refill: 3 - Lipid panel  4. Vitamin D deficiency Will check labs to check status - Vitamin  D, 25-hydroxy  5. Iron deficiency anemia, unspecified iron deficiency anemia type Will check labs to check status - CBC with Differential/Platelet     Patient have been counseled extensively about nutrition and exercise  Return in about 6 months (around 07/03/2018) for  assign PCP; f/up prediabetes and htn.  The patient was given clear instructions to go to ER or return to medical center if symptoms don't improve, worsen or new problems develop. The patient verbalized understanding. The patient was told to call to get lab results if they haven't heard anything in the next week.     Freeman Caldron, PA-C [facility] [place], [place]   12/31/2017, 8:51 AM

## 2018-01-01 ENCOUNTER — Telehealth: Payer: Self-pay | Admitting: *Deleted

## 2018-01-01 ENCOUNTER — Other Ambulatory Visit: Payer: Self-pay | Admitting: Physician Assistant

## 2018-01-01 DIAGNOSIS — E559 Vitamin D deficiency, unspecified: Secondary | ICD-10-CM

## 2018-01-01 DIAGNOSIS — R7303 Prediabetes: Secondary | ICD-10-CM

## 2018-01-01 LAB — COMPREHENSIVE METABOLIC PANEL
ALT: 33 IU/L — ABNORMAL HIGH (ref 0–32)
AST: 26 IU/L (ref 0–40)
Albumin/Globulin Ratio: 1.4 (ref 1.2–2.2)
Albumin: 4.4 g/dL (ref 3.5–5.5)
Alkaline Phosphatase: 69 IU/L (ref 39–117)
BILIRUBIN TOTAL: 0.3 mg/dL (ref 0.0–1.2)
BUN/Creatinine Ratio: 15 (ref 9–23)
BUN: 15 mg/dL (ref 6–24)
CALCIUM: 9.8 mg/dL (ref 8.7–10.2)
CHLORIDE: 98 mmol/L (ref 96–106)
CO2: 25 mmol/L (ref 20–29)
Creatinine, Ser: 1 mg/dL (ref 0.57–1.00)
GFR, EST AFRICAN AMERICAN: 78 mL/min/{1.73_m2} (ref >59)
GFR, EST NON AFRICAN AMERICAN: 68 mL/min/{1.73_m2} (ref >59)
GLUCOSE: 118 mg/dL — AB (ref 65–99)
Globulin, Total: 3.1 g/dL (ref 1.5–4.5)
Potassium: 4.3 mmol/L (ref 3.5–5.2)
Sodium: 138 mmol/L (ref 134–144)
TOTAL PROTEIN: 7.5 g/dL (ref 6.0–8.5)

## 2018-01-01 LAB — CBC WITH DIFFERENTIAL/PLATELET
BASOS ABS: 0 10*3/uL (ref 0.0–0.2)
Basos: 1 %
EOS (ABSOLUTE): 0.3 10*3/uL (ref 0.0–0.4)
Eos: 4 %
HEMOGLOBIN: 13.1 g/dL (ref 11.1–15.9)
Hematocrit: 41.1 % (ref 34.0–46.6)
Immature Grans (Abs): 0 10*3/uL (ref 0.0–0.1)
Immature Granulocytes: 0 %
LYMPHS ABS: 1.8 10*3/uL (ref 0.7–3.1)
Lymphs: 23 %
MCH: 30.2 pg (ref 26.6–33.0)
MCHC: 31.9 g/dL (ref 31.5–35.7)
MCV: 95 fL (ref 79–97)
MONOCYTES: 6 %
MONOS ABS: 0.5 10*3/uL (ref 0.1–0.9)
Neutrophils Absolute: 5.1 10*3/uL (ref 1.4–7.0)
Neutrophils: 66 %
PLATELETS: 304 10*3/uL (ref 150–379)
RBC: 4.34 x10E6/uL (ref 3.77–5.28)
RDW: 13.3 % (ref 12.3–15.4)
WBC: 7.7 10*3/uL (ref 3.4–10.8)

## 2018-01-01 LAB — LIPID PANEL
CHOL/HDL RATIO: 2.6 ratio (ref 0.0–4.4)
Cholesterol, Total: 204 mg/dL — ABNORMAL HIGH (ref 100–199)
HDL: 79 mg/dL (ref >39)
LDL Calculated: 103 mg/dL — ABNORMAL HIGH (ref 0–99)
Triglycerides: 110 mg/dL (ref 0–149)
VLDL Cholesterol Cal: 22 mg/dL (ref 5–40)

## 2018-01-01 LAB — HEMOGLOBIN A1C
Est. average glucose Bld gHb Est-mCnc: 120 mg/dL
Hgb A1c MFr Bld: 5.8 % — ABNORMAL HIGH (ref 4.8–5.6)

## 2018-01-01 LAB — VITAMIN D 25 HYDROXY (VIT D DEFICIENCY, FRACTURES): Vit D, 25-Hydroxy: 17.7 ng/mL — ABNORMAL LOW (ref 30.0–100.0)

## 2018-01-01 MED ORDER — METFORMIN HCL 500 MG PO TABS: 500.0000 mg | ORAL_TABLET | Freq: Two times a day (BID) | ORAL | 3 refills | Status: DC

## 2018-01-01 MED ORDER — VITAMIN D (ERGOCALCIFEROL) 1.25 MG (50000 UNIT) PO CAPS: ORAL_CAPSULE | ORAL | 0 refills | Status: DC

## 2018-01-01 NOTE — Telephone Encounter (Signed)
Patient verified DOB Patient is aware of 5.8 being her A1C and needing to increase metformin to BID. Patient is also aware of needing to take vitamin d once a week to address the low level. A recheck will be completed in 3-4 months. No further questions at this time.

## 2018-01-21 MED FILL — PRAVASTATIN NA 40 MG TAB: 40 | 30 days supply | Qty: 30 | Fill #0 | Status: TO

## 2018-01-21 MED FILL — metFORMIN HCL 500 MG TABS: 500 | 30 days supply | Qty: 30 | Fill #0

## 2018-01-21 MED FILL — LISINOPRIL-HCTZ 20-25 MG TA: 20-25 | 30 days supply | Qty: 30 | Fill #0 | Status: TO

## 2018-01-21 MED FILL — FLUTICASONE PROP 50 MCG SPR: 50 | 25 days supply | Qty: 16 | Fill #0

## 2018-03-18 ENCOUNTER — Other Ambulatory Visit: Payer: Self-pay | Admitting: Obstetrics and Gynecology

## 2018-03-18 DIAGNOSIS — N6489 Other specified disorders of breast: Secondary | ICD-10-CM

## 2018-03-24 ENCOUNTER — Ambulatory Visit
Admission: RE | Admit: 2018-03-24 | Discharge: 2018-03-24 | Disposition: A | Discharge status: Home / Self Care | Payer: BLUE CROSS/BLUE SHIELD | Source: Ambulatory Visit | Attending: Obstetrics and Gynecology | Admitting: Obstetrics and Gynecology

## 2018-03-24 DIAGNOSIS — R928 Other abnormal and inconclusive findings on diagnostic imaging of breast: Secondary | ICD-10-CM | POA: Diagnosis not present

## 2018-03-24 DIAGNOSIS — N6489 Other specified disorders of breast: Secondary | ICD-10-CM

## 2018-04-14 ENCOUNTER — Ambulatory Visit: Payer: BLUE CROSS/BLUE SHIELD | Attending: Nurse Practitioner | Admitting: Nurse Practitioner

## 2018-04-14 ENCOUNTER — Encounter: Payer: Self-pay | Admitting: Nurse Practitioner

## 2018-04-14 VITALS — BP 114/77 | HR 81 | Temp 98.7°F | Ht 61.0 in | Wt 221.4 lb

## 2018-04-14 DIAGNOSIS — R7303 Prediabetes: Secondary | ICD-10-CM | POA: Diagnosis not present

## 2018-04-14 DIAGNOSIS — Z7984 Long term (current) use of oral hypoglycemic drugs: Secondary | ICD-10-CM | POA: Diagnosis not present

## 2018-04-14 DIAGNOSIS — D219 Benign neoplasm of connective and other soft tissue, unspecified: Secondary | ICD-10-CM

## 2018-04-14 DIAGNOSIS — T162XXA Foreign body in left ear, initial encounter: Secondary | ICD-10-CM

## 2018-04-14 DIAGNOSIS — Z6841 Body Mass Index (BMI) 40.0 and over, adult: Secondary | ICD-10-CM | POA: Diagnosis not present

## 2018-04-14 DIAGNOSIS — D259 Leiomyoma of uterus, unspecified: Secondary | ICD-10-CM | POA: Insufficient documentation

## 2018-04-14 DIAGNOSIS — F5231 Female orgasmic disorder: Secondary | ICD-10-CM

## 2018-04-14 DIAGNOSIS — X58XXXA Exposure to other specified factors, initial encounter: Secondary | ICD-10-CM | POA: Diagnosis not present

## 2018-04-14 DIAGNOSIS — E559 Vitamin D deficiency, unspecified: Secondary | ICD-10-CM | POA: Diagnosis not present

## 2018-04-14 DIAGNOSIS — I10 Essential (primary) hypertension: Secondary | ICD-10-CM | POA: Diagnosis not present

## 2018-04-14 DIAGNOSIS — Z79899 Other long term (current) drug therapy: Secondary | ICD-10-CM | POA: Insufficient documentation

## 2018-04-14 DIAGNOSIS — K5909 Other constipation: Secondary | ICD-10-CM

## 2018-04-14 LAB — GLUCOSE, POCT (MANUAL RESULT ENTRY): POC Glucose: 107 mg/dl — AB (ref 70–99)

## 2018-04-14 MED ORDER — DOCUSATE SODIUM 50 MG PO CAPS: 50.0000 mg | ORAL_CAPSULE | Freq: Two times a day (BID) | ORAL | 0 refills | Status: AC

## 2018-04-14 MED ORDER — VITAMIN D (ERGOCALCIFEROL) 1.25 MG (50000 UNIT) PO CAPS: ORAL_CAPSULE | ORAL | 0 refills | Status: DC

## 2018-04-14 MED ORDER — METFORMIN HCL 500 MG PO TABS: 500.0000 mg | ORAL_TABLET | Freq: Two times a day (BID) | ORAL | 3 refills | Status: DC
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
## 2018-04-14 NOTE — Patient Instructions (Signed)

## 2018-04-15 ENCOUNTER — Telehealth: Payer: Self-pay | Admitting: Obstetrics and Gynecology

## 2018-04-15 LAB — CMP14+EGFR
A/G RATIO: 1.4 (ref 1.2–2.2)
ALBUMIN: 4.5 g/dL (ref 3.5–5.5)
ALK PHOS: 66 IU/L (ref 39–117)
ALT: 19 IU/L (ref 0–32)
AST: 19 IU/L (ref 0–40)
BUN / CREAT RATIO: 16 (ref 9–23)
BUN: 15 mg/dL (ref 6–24)
Bilirubin Total: 0.7 mg/dL (ref 0.0–1.2)
CHLORIDE: 103 mmol/L (ref 96–106)
CO2: 18 mmol/L — AB (ref 20–29)
Calcium: 10.3 mg/dL — ABNORMAL HIGH (ref 8.7–10.2)
Creatinine, Ser: 0.91 mg/dL (ref 0.57–1.00)
GFR calc Af Amer: 88 mL/min/{1.73_m2} (ref >59)
GFR calc non Af Amer: 76 mL/min/{1.73_m2} (ref >59)
GLOBULIN, TOTAL: 3.2 g/dL (ref 1.5–4.5)
Glucose: 97 mg/dL (ref 65–99)
Potassium: 4.2 mmol/L (ref 3.5–5.2)
SODIUM: 136 mmol/L (ref 134–144)
Total Protein: 7.7 g/dL (ref 6.0–8.5)

## 2018-04-15 NOTE — Telephone Encounter (Signed)
Left message to schedule ngyn doctor referral appointment

## 2018-04-19 DIAGNOSIS — T169XXA Foreign body in ear, unspecified ear, initial encounter: Secondary | ICD-10-CM | POA: Insufficient documentation

## 2018-04-19 DIAGNOSIS — Z7289 Other problems related to lifestyle: Secondary | ICD-10-CM | POA: Diagnosis not present

## 2018-04-19 DIAGNOSIS — Z87891 Personal history of nicotine dependence: Secondary | ICD-10-CM | POA: Diagnosis not present

## 2018-04-19 DIAGNOSIS — T162XXA Foreign body in left ear, initial encounter: Secondary | ICD-10-CM | POA: Diagnosis not present

## 2018-04-21 ENCOUNTER — Telehealth: Payer: Self-pay

## 2018-04-21 NOTE — Telephone Encounter (Signed)
CMA attempt to call patient to inform on lab results.  No answer and left a VM for patient to call back.  If patient call back, please inform:  Labs are essentially normal. There are some minor variations in your blood work that do not require any additional work up at this time. Will continue to monitor. Make sure you are drinking at least 48 oz of water per day. Work on eating a low fat, heart healthy diet and participate in regular aerobic exercise program to control as well. Exercise at least  30 minutes per day-5 days per week. Avoid red meat. No fried foods. No junk foods, sodas, sugary foods or drinks, unhealthy snacking, alcohol or smoking.

## 2018-04-21 NOTE — Telephone Encounter (Signed)
-----   Message from [name], NP sent at 04/18/2018  1:26 PM EDT ----- Labs are essentially normal. There are some minor variations in your blood work that do not require any additional work up at this time. Will continue to monitor. Make sure you are drinking at least 48 oz of water per day. Work on eating a low fat, heart healthy diet and participate in regular aerobic exercise program to control as well. Exercise at least  30 minutes per day-5 days per week. Avoid red meat. No fried foods. No junk foods, sodas, sugary foods or drinks, unhealthy snacking, alcohol or smoking.

## 2018-05-05 ENCOUNTER — Ambulatory Visit: Payer: BLUE CROSS/BLUE SHIELD | Attending: Nurse Practitioner | Admitting: Nurse Practitioner

## 2018-05-05 ENCOUNTER — Encounter: Payer: Self-pay | Admitting: Nurse Practitioner

## 2018-05-05 VITALS — BP 124/87 | HR 73 | Temp 98.3°F | Ht 61.0 in | Wt 228.2 lb

## 2018-05-05 DIAGNOSIS — E559 Vitamin D deficiency, unspecified: Secondary | ICD-10-CM | POA: Insufficient documentation

## 2018-05-05 DIAGNOSIS — E669 Obesity, unspecified: Secondary | ICD-10-CM | POA: Diagnosis not present

## 2018-05-05 DIAGNOSIS — R7303 Prediabetes: Secondary | ICD-10-CM

## 2018-05-05 DIAGNOSIS — Z79899 Other long term (current) drug therapy: Secondary | ICD-10-CM | POA: Diagnosis not present

## 2018-05-05 DIAGNOSIS — I10 Essential (primary) hypertension: Secondary | ICD-10-CM | POA: Insufficient documentation

## 2018-05-05 DIAGNOSIS — Z7984 Long term (current) use of oral hypoglycemic drugs: Secondary | ICD-10-CM | POA: Insufficient documentation

## 2018-05-05 DIAGNOSIS — Z6841 Body Mass Index (BMI) 40.0 and over, adult: Secondary | ICD-10-CM | POA: Insufficient documentation

## 2018-05-05 DIAGNOSIS — K5904 Chronic idiopathic constipation: Secondary | ICD-10-CM | POA: Diagnosis not present

## 2018-05-05 LAB — GLUCOSE, POCT (MANUAL RESULT ENTRY): POC GLUCOSE: 88 mg/dL (ref 70–99)

## 2018-05-05 MED ORDER — VITAMIN D (ERGOCALCIFEROL) 1.25 MG (50000 UNIT) PO CAPS: ORAL_CAPSULE | ORAL | 0 refills | Status: DC

## 2018-05-05 NOTE — Patient Instructions (Signed)

## 2018-05-05 NOTE — Progress Notes (Signed)
Assessment & Plan:  Latoya Juarez was seen today for follow-up.  Diagnoses and all orders for this visit:  Chronic idiopathic constipation May take OTC colace. Not covered by prescription. She will try to 3 weeks and if no resolution will send to GI.   Vitamin D deficiency -     Vitamin D, Ergocalciferol, (DRISDOL) 50000 units CAPS capsule; TAKE ONE CAPSULE BY MOUTH ONCE A WEEK FOR  12 WEEKS.  Prediabetes -     Glucose (CBG) Continue blood sugar control as discussed in office today, low carbohydrate diet, and regular physical exercise as tolerated, 150 minutes per week (30 min each day, 5 days per week, or 50 min 3 days per week) Annual eye exams and foot exams are recommended.     Patient has been counseled on age-appropriate routine health concerns for screening and prevention. These are reviewed and up-to-date. Referrals have been placed accordingly. Immunizations are up-to-date or declined.    Subjective:   Chief Complaint  Patient presents with  . Follow-up    Pt. is here to follow-up on constipation. Pt. stated the pharmacy did not receive the Docusate that was sent last time.    HPI Latoya Juarez 47 y.o. female presents to office today for constipation.   Constipation She states the pharmacy never received the script I sent for her colace however after verifying with the pharmacy, colace is not covered by the insurance. I have instructed the patient to purchase over the counter. She continue to endorse constipation which is chronic. Endorses weekly (not daily) bowel movement unless a laxative is taken. Drinking at least a gallon of water daily. Other symptoms include hemorrhoids with painful defacation. Comorbidites: Obesity and HTN taking prinzide.   Review of Systems  Constitutional: Negative for fever, malaise/fatigue and weight loss.  HENT: Negative.  Negative for nosebleeds.   Eyes: Negative.  Negative for blurred vision, double vision and photophobia.  Respiratory:  Negative.  Negative for cough and shortness of breath.   Cardiovascular: Negative.  Negative for chest pain, palpitations and leg swelling.  Gastrointestinal: Positive for constipation. Negative for abdominal pain, blood in stool, diarrhea, heartburn, melena, nausea and vomiting.  Musculoskeletal: Negative.  Negative for myalgias.  Neurological: Negative.  Negative for dizziness, focal weakness, seizures and headaches.  Psychiatric/Behavioral: Negative.  Negative for suicidal ideas.       Past Medical History:  Diagnosis Date  . Hypertension   . Obesity   . Prediabetes     Past Surgical History:  Procedure Laterality Date  . CESAREAN SECTION      Family History  Problem Relation Age of Onset  . Breast cancer Maternal Aunt   . Diabetes Mother   . Hypertension Mother   . Stroke Mother   . Hypertension Brother   . Diabetes Brother   . Stroke Brother     Social History Reviewed with no changes to be made today.   Outpatient Medications Prior to Visit  Medication Sig Dispense Refill  . fluticasone (FLONASE) 50 MCG/ACT nasal spray Place 2 sprays into both nostrils daily. 16 g 6  . ibuprofen (ADVIL,MOTRIN) 800 MG tablet Take 1 tablet (800 mg total) by mouth every 8 (eight) hours as needed (Take with food.). 30 tablet 0  . lisinopril-hydrochlorothiazide (PRINZIDE,ZESTORETIC) 20-25 MG tablet Take 1 tablet by mouth daily. 90 tablet 3  . metFORMIN (GLUCOPHAGE) 500 MG tablet Take 1 tablet (500 mg total) by mouth 2 (two) times daily with a meal. 180 tablet 3  .  pravastatin (PRAVACHOL) 40 MG tablet Take 1 tablet (40 mg total) by mouth daily. 90 tablet 3  . docusate sodium (COLACE) 50 MG capsule Take 1 capsule (50 mg total) by mouth 2 (two) times daily. (Patient not taking: Reported on 05/05/2018) 60 capsule 0  . Vitamin D, Ergocalciferol, (DRISDOL) 50000 units CAPS capsule TAKE ONE CAPSULE BY MOUTH ONCE A WEEK FOR 8 WEEKS. (Patient not taking: Reported on 05/05/2018) 16 capsule 0   No  facility-administered medications prior to visit.     No Known Allergies     Objective:    BP 124/87 (BP Location: Left Arm, Patient Position: Sitting, Cuff Size: Large)   Pulse 73   Temp 98.3 F (36.8 C) (Oral)   Ht 5\' 1"  (1.549 m)   Wt 228 lb 3.2 oz (103.5 kg)   SpO2 98%   BMI 43.12 kg/m  Wt Readings from Last 3 Encounters:  05/05/18 228 lb 3.2 oz (103.5 kg)  04/14/18 221 lb 6.4 oz (100.4 kg)  12/31/17 228 lb (103.4 kg)    Physical Exam  Constitutional: She is oriented to person, place, and time. She appears well-developed and well-nourished. She is cooperative.  HENT:  Head: Normocephalic and atraumatic.  Eyes: EOM are normal.  Neck: Normal range of motion.  Cardiovascular: Normal rate, regular rhythm and normal heart sounds. Exam reveals no gallop and no friction rub.  No murmur heard. Pulmonary/Chest: Effort normal and breath sounds normal. No tachypnea. No respiratory distress. She has no decreased breath sounds. She has no wheezes. She has no rhonchi. She has no rales. She exhibits no tenderness.  Abdominal: Bowel sounds are normal.  Musculoskeletal: Normal range of motion. She exhibits no edema.  Neurological: She is alert and oriented to person, place, and time. Coordination normal.  Skin: Skin is warm and dry.  Psychiatric: She has a normal mood and affect. Her behavior is normal. Judgment and thought content normal.  Nursing note and vitals reviewed.      Patient has been counseled extensively about nutrition and exercise as well as the importance of adherence with medications and regular follow-up. The patient was given clear instructions to go to ER or return to medical center if symptoms don't improve, worsen or new problems develop. The patient verbalized understanding.   Follow-up: Return in about 3 weeks (around 05/26/2018) for Constipation.   [name], FNP-BC [facility] and [facility] [place], [place]     05/05/2018, 10:25 AM

## 2018-05-11 NOTE — Progress Notes (Signed)
47 y.o. G5P5000 Legally SeparatedAfrican AmericanF here for a consultation for orgasmic dysfunction and fibroids. She was sent by Geryl Rankins, NP.   She has had issues with having orgasm. Can still have orgasms, just not as frequent. She used to have orgasms every time she had sex (either with penetration or with clitoral stimulation). Now having orgasms 2 out of 5 times. Same partner since 2002, things are good. No change in libido, wants to have sex all the time.  She never tries to have an orgasm by her self. The orgasms are just as intense as they always were. Her relationship is complicated. She denies any symptoms from her fibroids, just wondered if her fibroids could be causing issues with orgasms.  Period Cycle (Days): 28 Period Duration (Days): 5-7 days Period Pattern: Regular Menstrual Flow: Light, Moderate Menstrual Control: Tampon, Maxi pad, Thin pad Menstrual Control Change Freq (Hours): changes pad/tampon every 2-3 hours Dysmenorrhea: (!) Moderate Dysmenorrhea Symptoms: Cramping  Patient's last menstrual period was 04/16/2018 (exact date).          Sexually active: Yes.    The current method of family planning is condoms sometimes.   Tubal Ligation Exercising: No.   Smoker:  no  Health Maintenance: Pap:  01/25/2016 WNL History of abnormal Pap:  no MMG:  03/24/2018 Birads 3: probably benign, follow up 1 year TDaP:  Unsure Gardasil: N/A   reports that she has quit smoking. Her smoking use included cigarettes. She smoked 0.00 packs per day. She has never used smokeless tobacco. She reports that she drinks alcohol. She reports that she does not use drugs.  Past Medical History:  Diagnosis Date  . Anemia   . Fibroid   . Hypertension   . Obesity   . Prediabetes     Past Surgical History:  Procedure Laterality Date  . CESAREAN SECTION    . TUBAL LIGATION      Current Outpatient Medications  Medication Sig Dispense Refill  . docusate sodium (COLACE) 50 MG capsule Take  1 capsule (50 mg total) by mouth 2 (two) times daily. 60 capsule 0  . fluticasone (FLONASE) 50 MCG/ACT nasal spray Place 2 sprays into both nostrils daily. 16 g 6  . ibuprofen (ADVIL,MOTRIN) 800 MG tablet Take 1 tablet (800 mg total) by mouth every 8 (eight) hours as needed (Take with food.). 30 tablet 0  . lisinopril-hydrochlorothiazide (PRINZIDE,ZESTORETIC) 20-25 MG tablet Take 1 tablet by mouth daily. 90 tablet 3  . metFORMIN (GLUCOPHAGE) 500 MG tablet Take 1 tablet (500 mg total) by mouth 2 (two) times daily with a meal. 180 tablet 3  . pravastatin (PRAVACHOL) 40 MG tablet Take 1 tablet (40 mg total) by mouth daily. 90 tablet 3  . Vitamin D, Ergocalciferol, (DRISDOL) 50000 units CAPS capsule TAKE ONE CAPSULE BY MOUTH ONCE A WEEK FOR  12 WEEKS. 12 capsule 0   No current facility-administered medications for this visit.     Family History  Problem Relation Age of Onset  . Breast cancer Maternal Aunt   . Diabetes Mother   . Hypertension Mother   . Stroke Mother   . Hypertension Brother   . Diabetes Brother   . Stroke Brother     Review of Systems  Constitutional: Negative.   HENT: Negative.   Eyes: Negative.   Respiratory: Negative.   Cardiovascular: Negative.   Gastrointestinal: Negative.   Endocrine: Negative.   Genitourinary:       Anorgasmia Uterine fibroids  Musculoskeletal: Negative.   Skin:  Negative.   Allergic/Immunologic: Negative.   Neurological: Negative.   Hematological: Negative.   Psychiatric/Behavioral: Negative.     Exam:   BP 130/80 (BP Location: Right Arm, Patient Position: Sitting)   Pulse 68   Ht 5\' 1"  (1.549 m)   Wt 223 lb 6.4 oz (101.3 kg)   LMP 04/16/2018 (Exact Date)   BMI 42.21 kg/m   Weight change: @WEIGHTCHANGE @ Height:   Height: 5\' 1"  (154.9 cm)  Ht Readings from Last 3 Encounters:  05/12/18 5\' 1"  (1.549 m)  05/05/18 5\' 1"  (1.549 m)  04/14/18 5\' 1"  (1.549 m)    General appearance: alert, cooperative and appears stated age Ultrasound  report from 5/18 was reviewed  A:  Recent issue with less frequent orgasms. Still same intensity. Her relationship is "complicated"  Healthy libido  H/O fibroid uterus, I don't think this is contributing to her sexual c/o. She denies any symptoms  P:   Given that she can still have intense orgasms and hasn't had any change in her libido, doubt there is anything wrong. Doubt hormonal issues. We discussed that her orgasm changes could be related to her relationship.  Information on orgasms given  Fibroids asymptomatic, no treatment needed  She is having exams with her primary, if she had sudden growth of her fibroids she should be evaluated.    CC: Geryl Rankins, NP Note sent

## 2018-05-12 ENCOUNTER — Encounter: Payer: Self-pay | Admitting: Obstetrics and Gynecology

## 2018-05-12 ENCOUNTER — Encounter

## 2018-05-12 ENCOUNTER — Ambulatory Visit (INDEPENDENT_AMBULATORY_CARE_PROVIDER_SITE_OTHER): Payer: BLUE CROSS/BLUE SHIELD | Admitting: Obstetrics and Gynecology

## 2018-05-12 ENCOUNTER — Other Ambulatory Visit: Payer: Self-pay

## 2018-05-12 VITALS — BP 130/80 | HR 68 | Ht 61.0 in | Wt 223.4 lb

## 2018-05-12 DIAGNOSIS — D259 Leiomyoma of uterus, unspecified: Secondary | ICD-10-CM | POA: Diagnosis not present

## 2018-05-12 DIAGNOSIS — F5231 Female orgasmic disorder: Secondary | ICD-10-CM | POA: Diagnosis not present

## 2018-05-12 DIAGNOSIS — IMO0002 Reserved for concepts with insufficient information to code with codable children: Secondary | ICD-10-CM

## 2018-05-31 ENCOUNTER — Ambulatory Visit: Payer: BLUE CROSS/BLUE SHIELD | Attending: Nurse Practitioner | Admitting: Nurse Practitioner

## 2018-05-31 ENCOUNTER — Encounter: Payer: Self-pay | Admitting: Nurse Practitioner

## 2018-05-31 VITALS — BP 100/72 | HR 76 | Temp 99.0°F | Ht 61.0 in | Wt 223.0 lb

## 2018-05-31 DIAGNOSIS — Z7984 Long term (current) use of oral hypoglycemic drugs: Secondary | ICD-10-CM | POA: Diagnosis not present

## 2018-05-31 DIAGNOSIS — E669 Obesity, unspecified: Secondary | ICD-10-CM | POA: Insufficient documentation

## 2018-05-31 DIAGNOSIS — R7303 Prediabetes: Secondary | ICD-10-CM | POA: Diagnosis not present

## 2018-05-31 DIAGNOSIS — Z79899 Other long term (current) drug therapy: Secondary | ICD-10-CM | POA: Diagnosis not present

## 2018-05-31 DIAGNOSIS — Z7951 Long term (current) use of inhaled steroids: Secondary | ICD-10-CM | POA: Insufficient documentation

## 2018-05-31 DIAGNOSIS — H02402 Unspecified ptosis of left eyelid: Secondary | ICD-10-CM

## 2018-05-31 DIAGNOSIS — Z6841 Body Mass Index (BMI) 40.0 and over, adult: Secondary | ICD-10-CM | POA: Insufficient documentation

## 2018-05-31 DIAGNOSIS — K5901 Slow transit constipation: Secondary | ICD-10-CM | POA: Diagnosis not present

## 2018-05-31 DIAGNOSIS — I10 Essential (primary) hypertension: Secondary | ICD-10-CM | POA: Insufficient documentation

## 2018-05-31 DIAGNOSIS — Z8249 Family history of ischemic heart disease and other diseases of the circulatory system: Secondary | ICD-10-CM | POA: Insufficient documentation

## 2018-05-31 LAB — GLUCOSE, POCT (MANUAL RESULT ENTRY): POC Glucose: 128 mg/dl — AB (ref 70–99)

## 2018-05-31 NOTE — Patient Instructions (Signed)

## 2018-05-31 NOTE — Progress Notes (Signed)
Assessment & Plan:  Latoya Juarez was seen today for follow-up and referral.  Diagnoses and all orders for this visit:  Slow transit constipation -     Ambulatory referral to Gastroenterology  Prediabetes -     Glucose (CBG) Controlled Continue blood sugar control as discussed in office today, low carbohydrate diet, and regular physical exercise as tolerated, 150 minutes per week (30 min each day, 5 days per week, or 50 min 3 days per week). Annual eye exams and foot exams are recommended.  Ptosis of left eyelid -     Ambulatory referral to Ophthalmology    Patient has been counseled on age-appropriate routine health concerns for screening and prevention. These are reviewed and up-to-date. Referrals have been placed accordingly. Immunizations are up-to-date or declined.    Subjective:   Chief Complaint  Patient presents with  . Follow-up    Pt. is here to follow-up on constipation. Pt. stated she is still having constipation.   . Referral    Pt. request if she can get a eye doctor referral.    HPI Latoya Juarez 47 y.o. female presents to office today for follow up to constipation.  Constipation She has chronic constipation. I prescribed her colace 50mg  BID at her last office visit with me on 05-05-2018. Today she reports minimal relief of her constipation with taking colace. She also endorses increased abdominal cramping and pain with colace so she has stopped taking. She has tried other laxatives and stimulants in the past including miralax. Bowel movement are occurring only 1-2 days per week. She is unable to produce a bowel movement unless she takes an OTC laxative. Drinking plenty of water daily however does not incorporate enough fruits and vegetables in her diet. Comorbids: Obesity and thiazide diuretic. Complications include external hemorrhoids. She denies any melena, hematochezia or BRBPR.  Endorses feeling of incomplete evacuation after bowel movements. Will refer to GI for  additional evaluation and treatment.   Prediabetes Stable. She denies any hypo or hyperglycemic symptom. Currently not taking any oral antidiabetic agents.  Lab Results  Component Value Date   HGBA1C 5.8 (H) 12/31/2017   Review of Systems  Constitutional: Negative for fever, malaise/fatigue and weight loss.  HENT: Negative.  Negative for nosebleeds.   Eyes: Negative.  Negative for blurred vision, double vision, photophobia, pain, discharge and redness.  Respiratory: Negative.  Negative for cough and shortness of breath.   Cardiovascular: Negative.  Negative for chest pain, palpitations and leg swelling.  Gastrointestinal: Negative.  Negative for heartburn, nausea and vomiting.  Musculoskeletal: Negative.  Negative for myalgias.  Neurological: Negative.  Negative for dizziness, focal weakness, seizures and headaches.  Psychiatric/Behavioral: Negative.  Negative for suicidal ideas.    Past Medical History:  Diagnosis Date  . Anemia   . Fibroid   . Hypertension   . Obesity   . Prediabetes     Past Surgical History:  Procedure Laterality Date  . CESAREAN SECTION    . TUBAL LIGATION      Family History  Problem Relation Age of Onset  . Breast cancer Maternal Aunt   . Diabetes Mother   . Hypertension Mother   . Stroke Mother   . Hypertension Brother   . Diabetes Brother   . Stroke Brother     Social History Reviewed with no changes to be made today.   Outpatient Medications Prior to Visit  Medication Sig Dispense Refill  . fluticasone (FLONASE) 50 MCG/ACT nasal spray Place 2 sprays into  both nostrils daily. 16 g 6  . ibuprofen (ADVIL,MOTRIN) 800 MG tablet Take 1 tablet (800 mg total) by mouth every 8 (eight) hours as needed (Take with food.). 30 tablet 0  . lisinopril-hydrochlorothiazide (PRINZIDE,ZESTORETIC) 20-25 MG tablet Take 1 tablet by mouth daily. 90 tablet 3  . metFORMIN (GLUCOPHAGE) 500 MG tablet Take 1 tablet (500 mg total) by mouth 2 (two) times daily with a  meal. 180 tablet 3  . pravastatin (PRAVACHOL) 40 MG tablet Take 1 tablet (40 mg total) by mouth daily. 90 tablet 3  . Vitamin D, Ergocalciferol, (DRISDOL) 50000 units CAPS capsule TAKE ONE CAPSULE BY MOUTH ONCE A WEEK FOR  12 WEEKS. 12 capsule 0   No facility-administered medications prior to visit.     No Known Allergies     Objective:    BP 100/72 (BP Location: Left Arm, Patient Position: Sitting, Cuff Size: Large)   Pulse 76   Temp 99 F (37.2 C) (Oral)   Ht 5\' 1"  (1.549 m)   Wt 223 lb (101.2 kg)   LMP 05/17/2018   SpO2 95%   BMI 42.14 kg/m  Wt Readings from Last 3 Encounters:  05/31/18 223 lb (101.2 kg)  05/12/18 223 lb 6.4 oz (101.3 kg)  05/05/18 228 lb 3.2 oz (103.5 kg)    Physical Exam  Constitutional: She is oriented to person, place, and time. She appears well-developed and well-nourished. She is cooperative.  HENT:  Head: Normocephalic and atraumatic.  Eyes: Pupils are equal, round, and reactive to light. EOM are normal.  Left eye ptosis  Neck: Normal range of motion.  Cardiovascular: Normal rate, regular rhythm, normal heart sounds and intact distal pulses. Exam reveals no gallop and no friction rub.  No murmur heard. Pulmonary/Chest: Effort normal and breath sounds normal. No tachypnea. No respiratory distress. She has no decreased breath sounds. She has no wheezes. She has no rhonchi. She has no rales. She exhibits no tenderness.  Abdominal: Soft. Bowel sounds are normal.  Musculoskeletal: Normal range of motion. She exhibits no edema.  Neurological: She is alert and oriented to person, place, and time. Coordination normal.  Skin: Skin is warm and dry.  Psychiatric: She has a normal mood and affect. Her behavior is normal. Judgment and thought content normal.  Nursing note and vitals reviewed.        Patient has been counseled extensively about nutrition and exercise as well as the importance of adherence with medications and regular follow-up. The  patient was given clear instructions to go to ER or return to medical center if symptoms don't improve, worsen or new problems develop. The patient verbalized understanding.   Follow-up: Return for physical .   [name], FNP-BC [facility], [place]   05/31/2018, 1:27 PM

## 2018-06-30 DIAGNOSIS — H02422 Myogenic ptosis of left eyelid: Secondary | ICD-10-CM | POA: Diagnosis not present

## 2018-07-09 ENCOUNTER — Ambulatory Visit: Payer: Self-pay | Admitting: Gastroenterology

## 2018-07-30 ENCOUNTER — Encounter: Payer: Self-pay | Admitting: Nurse Practitioner

## 2019-01-22 ENCOUNTER — Other Ambulatory Visit: Payer: Self-pay | Admitting: Physician Assistant

## 2019-01-22 DIAGNOSIS — E782 Mixed hyperlipidemia: Secondary | ICD-10-CM

## 2019-01-22 DIAGNOSIS — I10 Essential (primary) hypertension: Secondary | ICD-10-CM

## 2019-04-05 ENCOUNTER — Other Ambulatory Visit: Payer: Self-pay

## 2019-04-05 ENCOUNTER — Encounter: Payer: Self-pay | Admitting: Nurse Practitioner

## 2019-04-05 ENCOUNTER — Ambulatory Visit: Payer: Self-pay | Attending: Nurse Practitioner | Admitting: Nurse Practitioner

## 2019-04-05 DIAGNOSIS — F419 Anxiety disorder, unspecified: Secondary | ICD-10-CM

## 2019-04-05 DIAGNOSIS — F329 Major depressive disorder, single episode, unspecified: Secondary | ICD-10-CM

## 2019-04-05 MED ORDER — ESCITALOPRAM OXALATE 10 MG PO TABS: 10.0000 mg | ORAL_TABLET | Freq: Every day | ORAL | 1 refills | Status: DC

## 2019-04-05 MED ORDER — HYDROXYZINE PAMOATE 25 MG PO CAPS: 25.0000 mg | ORAL_CAPSULE | Freq: Three times a day (TID) | ORAL | 1 refills | Status: DC | PRN

## 2019-04-05 NOTE — Progress Notes (Signed)
Virtual Visit via Telephone Note Due to national recommendations of social distancing due to [place] 19, telehealth visit is felt to be most appropriate for this patient at this time.  I discussed the limitations, risks, security and privacy concerns of performing an evaluation and management service by telephone and the availability of in person appointments. I also discussed with the patient that there may be a patient responsible charge related to this service. The patient expressed understanding and agreed to proceed.    I connected with [name] on 04/05/19  at   9:10 AM EDT  EDT by telephone and verified that I am speaking with the correct person using two identifiers.   Consent I discussed the limitations, risks, security and privacy concerns of performing an evaluation and management service by telephone and the availability of in person appointments. I also discussed with the patient that there may be a patient responsible charge related to this service. The patient expressed understanding and agreed to proceed.   Location of Patient: Private Residence    Location of Provider: [place] and [employer] Office    Persons participating in Telemedicine visit: Geryl Rankins FNP-BC [place]    History of Present Illness: Telemedicine visit for: Anxiety and Depression and Paperwork   Anxiety and Depression Her brother passed last Tuesday. His funeral is tomorrow. She has been having a difficult time dealing with grief and depression. States she has started back drinking and smoking. Has requested to take time off from work until 04-25-2019. PHq9 is positive today as well as GAD. She is agreeable to anxiolytic and antidepressant. She denies any current thoughts of self harm.  Depression screen [facility] 2/9 04/05/2019 05/31/2018 04/14/2018 12/31/2017 05/22/2017  Decreased Interest 1 0 0 0 0  Down, Depressed, Hopeless 2 0 0 0 0  PHQ - 2 Score 3 0 0 0 0  Altered  sleeping 0 0 0 0 0  Tired, decreased energy 0 0 0 0 0  Change in appetite 2 0 0 0 0  Feeling bad or failure about yourself  0 0 0 0 0  Trouble concentrating 2 0 0 0 0  Moving slowly or fidgety/restless 0 0 0 0 0  Suicidal thoughts 0 0 0 0 0  PHQ-9 Score 7 0 0 0 0   GAD 7 : Generalized Anxiety Score 04/05/2019 05/31/2018 04/14/2018 12/31/2017  Nervous, Anxious, on Edge 2 0 0 0  Control/stop worrying 2 0 0 0  Worry too much - different things 2 0 0 0  Trouble relaxing 2 0 0 0  Restless 2 0 0 0  Easily annoyed or irritable 2 0 0 0  Afraid - awful might happen 0 0 0 0  Total GAD 7 Score 12 0 0 0     Past Medical History:  Diagnosis Date  . Anemia   . Fibroid   . Hypertension   . Obesity   . Prediabetes     Past Surgical History:  Procedure Laterality Date  . CESAREAN SECTION    . TUBAL LIGATION      Family History  Problem Relation Age of Onset  . Breast cancer Maternal Aunt   . Diabetes Mother   . Hypertension Mother   . Stroke Mother   . Hypertension Brother   . Diabetes Brother   . Stroke Brother     Social History   Socioeconomic History  . Marital status: Legally Separated    Spouse name: Not  on file  . Number of children: Not on file  . Years of education: Not on file  . Highest education level: Not on file  Occupational History  . Not on file  Social Needs  . Financial resource strain: Not on file  . Food insecurity    Worry: Not on file    Inability: Not on file  . Transportation needs    Medical: Not on file    Non-medical: Not on file  Tobacco Use  . Smoking status: Current Every Day Smoker    Packs/day: 0.00    Types: Cigarettes  . Smokeless tobacco: Never Used  . Tobacco comment: Quit 12/22/17, 5 months ago quit.   Substance and Sexual Activity  . Alcohol use: Yes    Comment: socially  . Drug use: No  . Sexual activity: Yes    Birth control/protection: Surgical, Condom  Lifestyle  . Physical activity    Days per week: Not on file     Minutes per session: Not on file  . Stress: Not on file  Relationships  . Social Herbalist on phone: Not on file    Gets together: Not on file    Attends religious service: Not on file    Active member of club or organization: Not on file    Attends meetings of clubs or organizations: Not on file    Relationship status: Not on file  Other Topics Concern  . Not on file  Social History Narrative  . Not on file     Observations/Objective: Awake, alert and oriented x 3   Review of Systems  Constitutional: Negative for fever, malaise/fatigue and weight loss.  HENT: Negative.  Negative for nosebleeds.   Eyes: Negative.  Negative for blurred vision, double vision and photophobia.  Respiratory: Negative.  Negative for cough and shortness of breath.   Cardiovascular: Negative.  Negative for chest pain, palpitations and leg swelling.  Gastrointestinal: Negative.  Negative for heartburn, nausea and vomiting.  Musculoskeletal: Negative.  Negative for myalgias.  Neurological: Negative.  Negative for dizziness, focal weakness, seizures and headaches.  Psychiatric/Behavioral: Positive for depression. Negative for suicidal ideas. The patient is nervous/anxious.     Assessment and Plan: Leeona was evaluated today for anxiety.  Diagnoses and all orders for this visit:  Anxiety and depression -     escitalopram (LEXAPRO) 10 MG tablet; Take 1 tablet (10 mg total) by mouth daily for 30 days. -     hydrOXYzine (VISTARIL) 25 MG capsule; Take 1 capsule (25 mg total) by mouth 3 (three) times daily as needed for up to 30 days for anxiety.     Follow Up Instructions Return in about 3 weeks (around 04/26/2019).     I discussed the assessment and treatment plan with the patient. The patient was provided an opportunity to ask questions and all were answered. The patient agreed with the plan and demonstrated an understanding of the instructions.   The patient was advised to call back or  seek an in-person evaluation if the symptoms worsen or if the condition fails to improve as anticipated.  I provided 23 minutes of non-face-to-face time during this encounter including median intraservice time, reviewing previous notes, labs, imaging, medications and explaining diagnosis and management.  [name], FNP-BC

## 2019-04-11 ENCOUNTER — Telehealth: Payer: Self-pay | Admitting: Nurse Practitioner

## 2019-04-11 NOTE — Telephone Encounter (Signed)
Patient called to check on the status of her paperwork for her FMLA [employer] Please follow up.

## 2019-04-12 NOTE — Telephone Encounter (Signed)
CMA inform patient it will be ready by Friday and CMA will fax it for her.

## 2019-04-26 ENCOUNTER — Telehealth: Payer: Self-pay | Admitting: Nurse Practitioner

## 2019-04-26 NOTE — Telephone Encounter (Signed)
Patient called requesting  A note to go back to work . Please, call her to pick up the letter

## 2019-04-27 NOTE — Telephone Encounter (Signed)
Patient stated her job is requesting a note saying it is okay for her to go back to work.  CMA did inform on her FMLA paperwork provider had put returned back to work date 04/25/2019.  Pt understood and stated her job is still requesting a note for her to return back to work and she return back to work on 04/27/2019.

## 2019-04-28 ENCOUNTER — Ambulatory Visit: Payer: Self-pay | Attending: Family Medicine | Admitting: Licensed Clinical Social Worker

## 2019-04-28 ENCOUNTER — Other Ambulatory Visit: Payer: Self-pay

## 2019-04-28 ENCOUNTER — Ambulatory Visit: Payer: Self-pay

## 2019-04-28 ENCOUNTER — Telehealth: Payer: Self-pay | Admitting: Nurse Practitioner

## 2019-04-28 ENCOUNTER — Encounter: Payer: Self-pay | Admitting: Nurse Practitioner

## 2019-04-28 ENCOUNTER — Other Ambulatory Visit: Payer: Self-pay | Admitting: Physician Assistant

## 2019-04-28 DIAGNOSIS — F329 Major depressive disorder, single episode, unspecified: Secondary | ICD-10-CM

## 2019-04-28 DIAGNOSIS — F419 Anxiety disorder, unspecified: Secondary | ICD-10-CM

## 2019-04-28 DIAGNOSIS — F4321 Adjustment disorder with depressed mood: Secondary | ICD-10-CM

## 2019-04-28 MED ORDER — ESCITALOPRAM OXALATE 20 MG PO TABS: 20.0000 mg | ORAL_TABLET | Freq: Every day | ORAL | 3 refills | Status: DC

## 2019-04-28 NOTE — Telephone Encounter (Signed)
Patient is aware letter is ready and will have her daughter to come get it.

## 2019-04-28 NOTE — Telephone Encounter (Signed)
Pt is on the phone crying she want to get a higher doses because she doesn't feel well..please follow up

## 2019-04-28 NOTE — Telephone Encounter (Signed)
CMA spoke to patient.  Patient want to see if she can increase her Lexapro to help her with her depression.  PCP is out of clinic and Freeman Caldron, [place] agreed to send her a new script with Lexapro 20mg  until her appt with PCP on 07.14.2020.  Pt. Agreed to the dose change and is aware of her visit with PCP to follow up on Anxiety and Depression.  Pt. Also agreed to speak to [place], Education officer, museum regarding about grieving counseling.

## 2019-04-28 NOTE — BH Specialist Note (Signed)
[place] Visit via Telemedicine (Telephone)  04/28/2019 Latoya Juarez [id number]   Session Start time: 4:20 PM  Session End time: 4:30 PM Total time: 10 minutes  Referring Provider: Weyman Pedro Type of Visit: Telephonic Patient location: Home [facility] Provider location: Office All persons participating in visit: CMA, Mariane Baumgarten, [place], patient  Confirmed patient's address: Yes  Confirmed patient's phone number: Yes  Any changes to demographics: No   Confirmed patient's insurance: No  Any changes to patient's insurance: No   Discussed confidentiality: No    The following statements were read to the patient and/or legal guardian that are established with the [facility] Provider.  "The purpose of this phone visit is to provide behavioral health care while limiting exposure to the coronavirus (COVID19).  There is a possibility of technology failure and discussed alternative modes of communication if that failure occurs."  "By engaging in this telephone visit, you consent to the provision of healthcare.  Additionally, you authorize for your insurance to be billed for the services provided during this telephone visit."   Patient and/or legal guardian consented to telephone visit: Yes   PRESENTING CONCERNS: Patient and/or family reports the following symptoms/concerns: Pt is coping with the recent loss of sibling Duration of problem: 1 month; Severity of problem: severe  STRENGTHS (Protective Factors/Coping Skills): Pt is open to counseling Pt is participating in medication management  GOALS ADDRESSED: Patient will: 1.  Reduce symptoms of: anxiety and depression  2.  Increase knowledge and/or ability of: coping skills  3.  Demonstrate ability to: Begin healthy grieving over loss  INTERVENTIONS: Interventions utilized:  Solution-Focused Strategies Standardized Assessments completed: Not Needed  ASSESSMENT: Call placed to patient with CMA, Mariane Baumgarten, to  follow up on behavioral health. Pt shared that she has been experiencing overwhelming feelings of sadness as a result of the recent loss of sibling.   LCSW educated pt on the stages of grief. Validation of feelings and encouragement was provided. CMA informed pt that antidepressant medication was increased, as per patient request. LCSW provided pt with grief support resources via text and verified mailing address. Pt was appreciative for the information.     PLAN: 1. Follow up with behavioral health clinician on : LCSW encouraged pt to contact [facility] with any additional questions or concerns 2. Behavioral recommendations: Pt encouraged to contact Manufacturing engineer for grief support services 3. Referral(s): Manufacturing engineer Grief Support  Rebekah Chesterfield 04/28/2019 5:08 PM

## 2019-04-28 NOTE — Telephone Encounter (Signed)
Note is in chart. Please let patient know I will not be able to change the date on her FMLA papers to 04-27-2019

## 2019-04-28 NOTE — Telephone Encounter (Signed)
Patients call taken.  Patient identified by name and date of birth.  Patient sobbing into phone stating she can't live like this.  States her brother died and she does not want to talk to triage Nurse she wants to talk to [facility] nurse.    Patient instructed that her PCP, Nurse and social worker would be advised.  Patient acknowledged understanding of advice.

## 2019-04-28 NOTE — Telephone Encounter (Signed)
CMA attempt to reach patient to inform that her letter is ready for her to pick up.  No answer and unable to leave a VM due to mailbox full.

## 2019-04-28 NOTE — Telephone Encounter (Signed)
Patient called requesting to talk to her provider or nurse about medications . Please. Call her back  Thank you  .

## 2019-04-28 NOTE — Progress Notes (Signed)
Patient called wanting to increase dose of lexapro.   I told her that would be fine.  I sent her a new prescription of Lexapro 20mg  daily to the pharmacy. Keep appt with PCP for next week.

## 2019-05-02 ENCOUNTER — Other Ambulatory Visit: Payer: Self-pay | Admitting: Nurse Practitioner

## 2019-05-02 NOTE — Telephone Encounter (Signed)
NOTED

## 2019-05-03 ENCOUNTER — Other Ambulatory Visit: Payer: Self-pay

## 2019-05-03 ENCOUNTER — Ambulatory Visit: Payer: Self-pay | Attending: Nurse Practitioner | Admitting: Nurse Practitioner

## 2019-05-03 ENCOUNTER — Encounter: Payer: Self-pay | Admitting: Nurse Practitioner

## 2019-05-03 DIAGNOSIS — F329 Major depressive disorder, single episode, unspecified: Secondary | ICD-10-CM

## 2019-05-03 DIAGNOSIS — F419 Anxiety disorder, unspecified: Secondary | ICD-10-CM

## 2019-05-03 NOTE — Patient Instructions (Signed)
Behavioral Health Resources:  ? ?What if I or someone I know is in crisis? ? ?If you are thinking about harming yourself or having thoughts of suicide, or if you know someone who is, seek help right away. ? ?Call your doctor or mental health care provider. ? ?Call 911 or go to a hospital emergency room to get immediate help, or ask a friend or family member to help you do these things. ? ?Call the [place] National Suicide Prevention Lifeline?s toll-free, 24-hour hotline at 1-800-273-TALK (1-800-273-8255) or TTY: 1-800-799-4 TTY (1-800-799-4889) to talk to a trained counselor. ? ?If you are in crisis, make sure you are not left alone.  ? ?If someone else is in crisis, make sure he or she is not left alone ? ? ?24 Hour Availability ? ?[facility]  ?[address], [place], [place] [address]  ?336-832-9700 or 1-800-711-2635 ? ?[facility] Crisis Line ?(Domestic Violence, Rape & Victim Assistance ?336-273-7273 ? ?[facility]  ?[address] [place], [place]  [address]               1-855-788-8787 or 336-676-6840 ? ?[facility] [place] [facility]    ?(ONLY from 8am-4pm)    ?336-899-1505 ? ?[facility] (24/7)   ?1-877-626-1772 ? ?[place] National Suicide Hotline   ?1-800-273-8255 (TALK) ? ?Support from local police to aid getting patient to hospital (http://www.-[place].gov/index.aspx?page=2797) ? ? ?      ?ONGOING BEHAVIORAL HEALTH SUPPORT FOR UNINSURED and UNDERINSURED:  ?[facility]  ?336-676-6840  ?[address]  ?Walk-in first time, Monday-Friday, 8:30am-5:00pm  ?*Bring snack, drink, something to do, long wait at first visit, they do have pharmacy for behavioral health medications/ Bring own interpreter at first visit, if needed ?[facility]  ?336-387-6161  ?[address]  ?Walk-in Monday-Friday, 8:30am-12pm & 1-2:30pm  ?*pacientes que hablen espanol, favor comunicarse con el Sr.  [place], extension 2244 o la [address], extension 3331 para hacer una cita  ?[facility]:  ?336-429-5600 or kellinfoundation@gmail.com  ?[address], [address]  ?Call or email, may self-refer  ?* uninsured/underinsured, 19-64yo, have both mental health and substance use challenges  ?[facility]:  ?Phone (336) [phone]; Fax [phone]) [phone]  ?*Call to schedule an appointment  ?3rd Floor located @?[address], [address] of [address]  ?Mon-Thursday: 8:30am-8:00pm Friday: 8:30am-7:00pm  ?* Be sure to park in a space labeled ?Psychology Department,? located to the right of the main door of the building. Enter the main doors facing the parking lot and take the elevator or stairs to the 3rd Floor.  ?[facility]:  ?336-832-9700 or  ?1-800-711-2635 (24/hour helpline)  ?[address]  ?Call to make appointment, tends to be a long wait to begin services, depending on insurance  ?[facility]  ?(336) 333-6860 ??  ?*Call to schedule an appointment  ?[address]  ?Monday-Friday, 8:00am-5:00pm  ?[facility]  ?(336) [phone]  ?[address], [place]  ?Monday-Friday, walk-in 8am-3pm  ?First appointment is assessment, then will make appointment for psychiatry   ?  ?

## 2019-05-03 NOTE — Progress Notes (Signed)
Virtual Visit via Telephone Note Due to national recommendations of social distancing due to [place] 19, telehealth visit is felt to be most appropriate for this patient at this time.  I discussed the limitations, risks, security and privacy concerns of performing an evaluation and management service by telephone and the availability of in person appointments. I also discussed with the patient that there may be a patient responsible charge related to this service. The patient expressed understanding and agreed to proceed.    I connected with [name] on 05/03/19  at   9:30 AM EDT  EDT by telephone and verified that I am speaking with the correct person using two identifiers.   Consent I discussed the limitations, risks, security and privacy concerns of performing an evaluation and management service by telephone and the availability of in person appointments. I also discussed with the patient that there may be a patient responsible charge related to this service. The patient expressed understanding and agreed to proceed.   Location of Patient: Private Residence   Location of Provider: [place] and [place] participating in Telemedicine visit: Geryl Rankins FNP-BC [place]    History of Present Illness: Telemedicine visit for: Depression/Grief Support  Her brother recently died unexpectedly. She had been out of work with depression and grieving the loss of her brother. States she went back to work and had difficulty working and dealing with her grief so she took a few more days off. She has also increased her SSRI lexapro from 10mg  to 20mg . She is interested in grief counseling and will pick up resources from the office today. She denies any thoughts of self harm. I had also prescribed her hydroxyzine however she states it gave her nightmares so she stopped taking it. She declines adding buspar today.  Depression screen [facility] 2/9  05/03/2019 04/05/2019 05/31/2018 04/14/2018 12/31/2017  Decreased Interest 2 1 0 0 0  Down, Depressed, Hopeless 2 2 0 0 0  PHQ - 2 Score 4 3 0 0 0  Altered sleeping 1 0 0 0 0  Tired, decreased energy 0 0 0 0 0  Change in appetite 2 2 0 0 0  Feeling bad or failure about yourself  0 0 0 0 0  Trouble concentrating 1 2 0 0 0  Moving slowly or fidgety/restless 0 0 0 0 0  Suicidal thoughts 0 0 0 0 0  PHQ-9 Score 8 7 0 0 0  Some recent data might be hidden   GAD 7 : Generalized Anxiety Score 05/03/2019 04/05/2019 05/31/2018 04/14/2018  Nervous, Anxious, on Edge 0 2 0 0  Control/stop worrying 1 2 0 0  Worry too much - different things 0 2 0 0  Trouble relaxing 1 2 0 0  Restless 0 2 0 0  Easily annoyed or irritable 2 2 0 0  Afraid - awful might happen 0 0 0 0  Total GAD 7 Score 4 12 0 0       Past Medical History:  Diagnosis Date  . Anemia   . Fibroid   . Hypertension   . Obesity   . Prediabetes     Past Surgical History:  Procedure Laterality Date  . CESAREAN SECTION    . TUBAL LIGATION      Family History  Problem Relation Age of Onset  . Breast cancer Maternal Aunt   . Diabetes Mother   . Hypertension Mother   . Stroke  Mother   . Hypertension Brother   . Diabetes Brother   . Stroke Brother     Social History   Socioeconomic History  . Marital status: Legally Separated    Spouse name: Not on file  . Number of children: Not on file  . Years of education: Not on file  . Highest education level: Not on file  Occupational History  . Not on file  Social Needs  . Financial resource strain: Not on file  . Food insecurity    Worry: Not on file    Inability: Not on file  . Transportation needs    Medical: Not on file    Non-medical: Not on file  Tobacco Use  . Smoking status: Current Every Day Smoker    Packs/day: 0.00    Types: Cigarettes  . Smokeless tobacco: Never Used  . Tobacco comment: Quit 12/22/17, 5 months ago quit.   Substance and Sexual Activity  . Alcohol  use: Yes    Comment: socially  . Drug use: No  . Sexual activity: Yes    Birth control/protection: Surgical, Condom  Lifestyle  . Physical activity    Days per week: Not on file    Minutes per session: Not on file  . Stress: Not on file  Relationships  . Social Herbalist on phone: Not on file    Gets together: Not on file    Attends religious service: Not on file    Active member of club or organization: Not on file    Attends meetings of clubs or organizations: Not on file    Relationship status: Not on file  Other Topics Concern  . Not on file  Social History Narrative  . Not on file     Observations/Objective: Awake, alert and oriented x 3   Review of Systems  Constitutional: Negative for fever, malaise/fatigue and weight loss.  HENT: Negative.  Negative for nosebleeds.   Eyes: Negative.  Negative for blurred vision, double vision and photophobia.  Respiratory: Negative.  Negative for cough and shortness of breath.   Cardiovascular: Negative.  Negative for chest pain, palpitations and leg swelling.  Gastrointestinal: Negative.  Negative for heartburn, nausea and vomiting.  Musculoskeletal: Negative.  Negative for myalgias.  Neurological: Negative.  Negative for dizziness, focal weakness, seizures and headaches.  Psychiatric/Behavioral: Positive for depression. Negative for hallucinations, memory loss, substance abuse and suicidal ideas. The patient is nervous/anxious. The patient does not have insomnia.     Assessment and Plan: Trinidee was seen today for follow-up.  Diagnoses and all orders for this visit:  Anxiety and depression Continue lexapro as prescribed.  Will pick up information from this office on local community resources for grief counseling   Follow Up Instructions Return in about 6 weeks (around 06/14/2019).     I discussed the assessment and treatment plan with the patient. The patient was provided an opportunity to ask questions and all  were answered. The patient agreed with the plan and demonstrated an understanding of the instructions.   The patient was advised to call back or seek an in-person evaluation if the symptoms worsen or if the condition fails to improve as anticipated.  I provided 18 minutes of non-face-to-face time during this encounter including median intraservice time, reviewing previous notes, labs, imaging, medications and explaining diagnosis and management.  [name], FNP-BC

## 2019-05-04 ENCOUNTER — Telehealth: Payer: Self-pay | Admitting: Nurse Practitioner

## 2019-05-04 NOTE — Telephone Encounter (Signed)
Patient came in stating she would like to get resources in regards to anxiety and depression. Please follow up.

## 2019-05-04 NOTE — Telephone Encounter (Signed)
CMA spoke to patient and inform that the behavorial resources will be placed in the front where the screening is.   Pt. Understood.

## 2019-05-24 ENCOUNTER — Other Ambulatory Visit: Payer: Self-pay | Admitting: Nurse Practitioner

## 2019-05-24 DIAGNOSIS — E782 Mixed hyperlipidemia: Secondary | ICD-10-CM

## 2019-05-25 ENCOUNTER — Other Ambulatory Visit: Payer: Self-pay | Admitting: Family Medicine

## 2019-05-25 DIAGNOSIS — I10 Essential (primary) hypertension: Secondary | ICD-10-CM

## 2019-06-20 ENCOUNTER — Other Ambulatory Visit: Payer: Self-pay | Admitting: Physician Assistant

## 2019-06-20 DIAGNOSIS — F419 Anxiety disorder, unspecified: Secondary | ICD-10-CM

## 2019-06-20 DIAGNOSIS — F329 Major depressive disorder, single episode, unspecified: Secondary | ICD-10-CM

## 2019-06-22 ENCOUNTER — Ambulatory Visit: Payer: Self-pay | Attending: Family Medicine | Admitting: Family Medicine

## 2019-06-22 ENCOUNTER — Encounter: Payer: Self-pay | Admitting: Family Medicine

## 2019-06-22 ENCOUNTER — Other Ambulatory Visit: Payer: Self-pay

## 2019-06-22 DIAGNOSIS — M533 Sacrococcygeal disorders, not elsewhere classified: Secondary | ICD-10-CM

## 2019-06-22 DIAGNOSIS — T6701XA Heatstroke and sunstroke, initial encounter: Secondary | ICD-10-CM

## 2019-06-22 DIAGNOSIS — W19XXXA Unspecified fall, initial encounter: Secondary | ICD-10-CM

## 2019-06-22 MED ORDER — MELOXICAM 7.5 MG PO TABS: 7.5000 mg | ORAL_TABLET | Freq: Every day | ORAL | 1 refills | Status: DC

## 2019-06-22 NOTE — Progress Notes (Signed)
Patient has been called and DOB has been verified. Patient has been screened and transferred to PCP to start phone visit.  Patient states that she was in the kitchen cooking and she began to feel hot and dizzy. She states that family came in and she was on the floor shaking.  Patient is having pain in tail bone from fall.

## 2019-06-22 NOTE — Progress Notes (Signed)
Virtual Visit via Telephone Note  I connected with Latoya Juarez, on 06/22/2019 at 9:40 AM by telephone due to the COVID-19 pandemic and verified that I am speaking with the correct person using two identifiers.   Consent: I discussed the limitations, risks, security and privacy concerns of performing an evaluation and management service by telephone and the availability of in person appointments. I also discussed with the patient that there may be a patient responsible charge related to this service. The patient expressed understanding and agreed to proceed.   Location of Patient: Home  Location of Provider: Clinic   Persons participating in Telemedicine visit: Lonya Schrenk [place]-CMA Dr. Felecia Shelling Daughter    History of Present Illness: 48 year old female with a history of hyperlipidemia, hypertension, depression prediabetes who is seen for an acute visit after she took a fall yesterday and hit her tailbone which now hurts. She was cooking and suddenly felt hot, lightheaded, needing to lie down and on her way to the room she fell on the floor.  This was witnessed by her daughter who noticed both legs with tremors and right hand when she was lying on her left arm.  Denies bowel or urine incontinence, denies salivation from the mouth.  She has had a similar episode several years ago after complaining of feeling hot. She is on metformin for prediabetes but does not check her sugar and is on lisinopril/HCTZ and her last blood pressure during an in person visit last year was on the low side-100/72. She denies a previous history of seizures, denies alcohol intake but drinks wine on social occasions. Currently feels well except for her tailbone pain, denies headaches, chest pains, palpitations.  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   Past Medical History:  Diagnosis Date  . Anemia   . Fibroid   .  Hypertension   . Obesity   . Prediabetes    Allergies  Allergen Reactions  . Hydroxyzine Other (See Comments)    Nightmares    Current Outpatient Medications on File Prior to Visit  Medication Sig Dispense Refill  . escitalopram (LEXAPRO) 20 MG tablet TAKE 1 TABLET BY MOUTH EVERY DAY 90 tablet 0  . lisinopril-hydrochlorothiazide (ZESTORETIC) 20-25 MG tablet Take 1 tablet by mouth daily. 90 tablet 0  . metFORMIN (GLUCOPHAGE) 500 MG tablet Take 1 tablet (500 mg total) by mouth 2 (two) times daily with a meal. 180 tablet 3  . pravastatin (PRAVACHOL) 40 MG tablet TAKE 1 TABLET BY MOUTH EVERY DAY 90 tablet 0  . fluticasone (FLONASE) 50 MCG/ACT nasal spray Place 2 sprays into both nostrils daily. (Patient not taking: Reported on 04/05/2019) 16 g 6  . ibuprofen (ADVIL,MOTRIN) 800 MG tablet Take 1 tablet (800 mg total) by mouth every 8 (eight) hours as needed (Take with food.). (Patient not taking: Reported on 04/05/2019) 30 tablet 0  . Vitamin D, Ergocalciferol, (DRISDOL) 50000 units CAPS capsule TAKE ONE CAPSULE BY MOUTH ONCE A WEEK FOR  12 WEEKS. (Patient not taking: Reported on 04/05/2019) 12 capsule 0   No current facility-administered medications on file prior to visit.     Observations/Objective: Alert, awake, oriented x3 Not in acute distress  Assessment and Plan: 1. Heat stroke, initial encounter Symptoms in keeping with heatstroke Advised to stay hydrated, allow proper aeration and ventilation while cooking We will need to exclude hypoglycemia and hypotension given she has not had a recent in  person visit Advised to come in for in person visit with PCP for evaluation of blood pressure and possible reduction in dose of her antihypertensive Also to keep a log of blood sugars We will need to exclude a possible seizure if this occurs again given clonus noticed during this episode and she will need an EEG for further work-up  2. Sacral pain Secondary to fall Apply heat next change  positions slowly - meloxicam (MOBIC) 7.5 MG tablet; Take 1 tablet (7.5 mg total) by mouth daily.  Dispense: 30 tablet; Refill: 1   Follow Up Instructions: Return in about 2 years (around 06/21/2021) for PCP- Zelda; to evaluate Hypertension and need for dose adjustment.    I discussed the assessment and treatment plan with the patient. The patient was provided an opportunity to ask questions and all were answered. The patient agreed with the plan and demonstrated an understanding of the instructions.   The patient was advised to call back or seek an in-person evaluation if the symptoms worsen or if the condition fails to improve as anticipated.     I provided 15 minutes total of non-face-to-face time during this encounter including median intraservice time, reviewing previous notes, labs, imaging, medications, management and patient verbalized understanding.     Charlott Rakes, MD, FAAFP. [facility] and [place], [place]   06/22/2019, 9:40 AM

## 2019-07-14 ENCOUNTER — Other Ambulatory Visit: Payer: Self-pay | Admitting: Nurse Practitioner

## 2019-07-14 DIAGNOSIS — E782 Mixed hyperlipidemia: Secondary | ICD-10-CM

## 2019-07-14 DIAGNOSIS — R7303 Prediabetes: Secondary | ICD-10-CM

## 2019-07-19 ENCOUNTER — Ambulatory Visit: Payer: Self-pay | Attending: Nurse Practitioner | Admitting: Nurse Practitioner

## 2019-07-19 ENCOUNTER — Other Ambulatory Visit: Payer: Self-pay

## 2019-07-19 ENCOUNTER — Encounter: Payer: Self-pay | Admitting: Nurse Practitioner

## 2019-07-19 VITALS — BP 97/64 | HR 92 | Temp 99.0°F | Ht 61.0 in | Wt 200.0 lb

## 2019-07-19 DIAGNOSIS — E559 Vitamin D deficiency, unspecified: Secondary | ICD-10-CM

## 2019-07-19 DIAGNOSIS — F419 Anxiety disorder, unspecified: Secondary | ICD-10-CM

## 2019-07-19 DIAGNOSIS — I10 Essential (primary) hypertension: Secondary | ICD-10-CM

## 2019-07-19 DIAGNOSIS — F329 Major depressive disorder, single episode, unspecified: Secondary | ICD-10-CM

## 2019-07-19 DIAGNOSIS — F17200 Nicotine dependence, unspecified, uncomplicated: Secondary | ICD-10-CM

## 2019-07-19 DIAGNOSIS — E669 Obesity, unspecified: Secondary | ICD-10-CM

## 2019-07-19 DIAGNOSIS — R7303 Prediabetes: Secondary | ICD-10-CM

## 2019-07-19 LAB — GLUCOSE, POCT (MANUAL RESULT ENTRY): POC Glucose: 147 mg/dl — AB (ref 70–99)

## 2019-07-19 LAB — POCT GLYCOSYLATED HEMOGLOBIN (HGB A1C): Hemoglobin A1C: 5.3 % (ref 4.0–5.6)

## 2019-07-19 MED ORDER — ESCITALOPRAM OXALATE 20 MG PO TABS: 20.0000 mg | ORAL_TABLET | Freq: Every day | ORAL | 2 refills | Status: DC

## 2019-07-19 MED ORDER — METFORMIN HCL 500 MG PO TABS: 500.0000 mg | ORAL_TABLET | Freq: Every day | ORAL | 1 refills | Status: DC

## 2019-07-19 MED ORDER — LISINOPRIL-HYDROCHLOROTHIAZIDE 10-12.5 MG PO TABS: 1.0000 | ORAL_TABLET | Freq: Every day | ORAL | 3 refills | Status: DC

## 2019-07-19 NOTE — Progress Notes (Signed)
Assessment & Plan:  Latoya Juarez was seen today for follow-up.  Diagnoses and all orders for this visit:  Prediabetes -     Glucose (CBG) -     HgB A1c -     metFORMIN (GLUCOPHAGE) 500 MG tablet; Take 1 tablet (500 mg total) by mouth daily with breakfast. -     CMP14+EGFR -     Lipid panel Continue blood sugar control as discussed in office today, low carbohydrate diet, and regular physical exercise as tolerated, 150 minutes per week (30 min each day, 5 days per week, or 50 min 3 days per week). Keep blood sugar logs with fasting goal of 90-130 mg/dl, post prandial (after you eat) less than 180.    Anxiety and depression -     escitalopram (LEXAPRO) 20 MG tablet; Take 1 tablet (20 mg total) by mouth daily. She denies any thoughts of self harm today.   Essential hypertension -     lisinopril-hydrochlorothiazide (ZESTORETIC) 10-12.5 MG tablet; Take 1 tablet by mouth daily. I10.0 -     CBC -     CMP14+EGFR Continue all antihypertensives as prescribed.  Remember to bring in your blood pressure log with you for your follow up appointment.  DASH/Mediterranean Diets are healthier choices for HTN.    Obesity (BMI 30-39.9) Discussed diet and exercise for person with BMI >37. Instructed: You must burn more calories than you eat. Losing 5 percent of your body weight should be considered a success. In the longer term, losing more than 15 percent of your body weight and staying at this weight is an extremely good result. However, keep in mind that even losing 5 percent of your body weight leads to important health benefits, so try not to get discouraged if you're not able to lose more than this. Will recheck weight in 3-6 months.  Vitamin D deficiency disease -     VITAMIN D 25 Hydroxy (Vit-D Deficiency, Fractures)  Tobacco dependence Latoya Juarez was counseled on the dangers of tobacco use, and was advised to quit. Reviewed strategies to maximize success, including removing cigarettes and smoking  materials from environment, stress management and support of family/friends as well as pharmacological alternatives including: Wellbutrin, Chantix, Nicotine patch, Nicotine gum or lozenges. Smoking cessation support: smoking cessation hotline: 1-800-QUIT-NOW.  Smoking cessation classes are also available through [facility]. Call [phone] or visit our website at [email or web address]   A total of 3 minutes was spent on counseling for smoking cessation and Latoya Juarez is not ready to quit.    Patient has been counseled on age-appropriate routine health concerns for screening and prevention. These are reviewed and up-to-date. Referrals have been placed accordingly. Immunizations are up-to-date or declined.    Subjective:   Chief Complaint  Patient presents with  . Follow-up    Pt. is here for a follow up hypertension.    HPI Latoya Juarez 48 y.o. female presents to office today for follow up.  Unfortunately she states she lost her mom in September (last month) from [place] and she had also been grieving the loss of her brother from earlier this year. She is currently taking lexapro 37m. I offered resources for grief counseling and she politely declined.   Essential Hypertension Controlled. She is currently taking lisinopril-hctz 10-12.532mdaily. Denies chest pain, shortness of breath, palpitations, lightheadedness, dizziness, headaches or BLE edema. She was instructed to call the office if she notices any SBP readings <90.  BP Readings from Last 3 Encounters:  07/19/19 97/64  05/31/18 100/72  05/12/18 130/80   Prediabetes Well controlled and improved. Will decrease metformin 500 mg from BID to once daily. She denies any hypo or hyperglycemic symptoms. She does not monitor her blood glucose levels at home. Weight is down 20lbs. She states her appetite has decreased since losing her brother and mother this year.  Lab Results  Component Value Date   HGBA1C 5.3  07/19/2019   Review of Systems  Constitutional: Negative for fever, malaise/fatigue and weight loss.  HENT: Negative.  Negative for nosebleeds.   Eyes: Negative.  Negative for blurred vision, double vision and photophobia.  Respiratory: Negative.  Negative for cough and shortness of breath.   Cardiovascular: Negative.  Negative for chest pain, palpitations and leg swelling.  Gastrointestinal: Negative.  Negative for heartburn, nausea and vomiting.  Musculoskeletal: Negative.  Negative for myalgias.  Neurological: Negative.  Negative for dizziness, focal weakness, seizures and headaches.  Psychiatric/Behavioral: Positive for depression. Negative for suicidal ideas.    Past Medical History:  Diagnosis Date  . Anemia   . Fibroid   . Hypertension   . Obesity   . Prediabetes     Past Surgical History:  Procedure Laterality Date  . CESAREAN SECTION    . TUBAL LIGATION      Family History  Problem Relation Age of Onset  . Breast cancer Maternal Aunt   . Diabetes Mother   . Hypertension Mother   . Stroke Mother   . Hypertension Brother   . Diabetes Brother   . Stroke Brother     Social History Reviewed with no changes to be made today.   Outpatient Medications Prior to Visit  Medication Sig Dispense Refill  . meloxicam (MOBIC) 7.5 MG tablet Take 1 tablet (7.5 mg total) by mouth daily. 30 tablet 1  . pravastatin (PRAVACHOL) 40 MG tablet TAKE 1 TABLET BY MOUTH EVERY DAY 90 tablet 0  . escitalopram (LEXAPRO) 20 MG tablet TAKE 1 TABLET BY MOUTH EVERY DAY 90 tablet 0  . lisinopril-hydrochlorothiazide (ZESTORETIC) 20-25 MG tablet Take 1 tablet by mouth daily. 90 tablet 0  . metFORMIN (GLUCOPHAGE) 500 MG tablet TAKE 1 TABLET (500 MG TOTAL) BY MOUTH 2 (TWO) TIMES DAILY WITH A MEAL. 180 tablet 0  . fluticasone (FLONASE) 50 MCG/ACT nasal spray Place 2 sprays into both nostrils daily. (Patient not taking: Reported on 04/05/2019) 16 g 6  . ibuprofen (ADVIL,MOTRIN) 800 MG tablet Take 1  tablet (800 mg total) by mouth every 8 (eight) hours as needed (Take with food.). (Patient not taking: Reported on 04/05/2019) 30 tablet 0  . Vitamin D, Ergocalciferol, (DRISDOL) 50000 units CAPS capsule TAKE ONE CAPSULE BY MOUTH ONCE A WEEK FOR  12 WEEKS. (Patient not taking: Reported on 04/05/2019) 12 capsule 0   No facility-administered medications prior to visit.     Allergies  Allergen Reactions  . Hydroxyzine Other (See Comments)    Nightmares       Objective:    BP 97/64 (BP Location: Left Arm, Patient Position: Sitting, Cuff Size: Normal)   Pulse 92   Temp 99 F (37.2 C) (Oral)   Ht 5' 1"  (1.549 m)   Wt 200 lb (90.7 kg)   LMP 07/07/2019   SpO2 98%   BMI 37.79 kg/m  Wt Readings from Last 3 Encounters:  07/19/19 200 lb (90.7 kg)  05/31/18 223 lb (101.2 kg)  05/12/18 223 lb 6.4 oz (101.3 kg)    Physical Exam Vitals signs and nursing note  reviewed.  Constitutional:      Appearance: She is well-developed.  HENT:     Head: Normocephalic and atraumatic.  Neck:     Musculoskeletal: Normal range of motion.  Cardiovascular:     Rate and Rhythm: Normal rate and regular rhythm.     Heart sounds: Normal heart sounds. No murmur. No friction rub. No gallop.   Pulmonary:     [place]: Pulmonary effort is normal. No tachypnea or respiratory distress.     Breath sounds: Normal breath sounds. No decreased breath sounds, wheezing, rhonchi or rales.  Chest:     Chest wall: No tenderness.  Abdominal:     General: Bowel sounds are normal.     Palpations: Abdomen is soft.  Musculoskeletal: Normal range of motion.  Skin:    General: Skin is warm and dry.  Neurological:     Mental Status: She is alert and oriented to person, place, and time.     Coordination: Coordination normal.  Psychiatric:        Behavior: Behavior normal. Behavior is cooperative.        Thought Content: Thought content normal.        Judgment: Judgment normal.          Patient has been counseled  extensively about nutrition and exercise as well as the importance of adherence with medications and regular follow-up. The patient was given clear instructions to go to ER or return to medical center if symptoms don't improve, worsen or new problems develop. The patient verbalized understanding.   Follow-up: Return for PAP SMEAR.   [name], FNP-BC [facility] and [place], [place]   07/24/2019, 5:46 PM

## 2019-07-20 LAB — CMP14+EGFR
ALT: 12 IU/L (ref 0–32)
AST: 17 IU/L (ref 0–40)
Albumin/Globulin Ratio: 1.5 (ref 1.2–2.2)
Albumin: 3.9 g/dL (ref 3.8–4.8)
Alkaline Phosphatase: 70 IU/L (ref 39–117)
BUN/Creatinine Ratio: 13 (ref 9–23)
BUN: 12 mg/dL (ref 6–24)
Bilirubin Total: 0.3 mg/dL (ref 0.0–1.2)
CO2: 24 mmol/L (ref 20–29)
Calcium: 9.7 mg/dL (ref 8.7–10.2)
Chloride: 103 mmol/L (ref 96–106)
Creatinine, Ser: 0.95 mg/dL (ref 0.57–1.00)
GFR calc Af Amer: 82 mL/min/{1.73_m2} (ref >59)
GFR calc non Af Amer: 72 mL/min/{1.73_m2} (ref >59)
Globulin, Total: 2.6 g/dL (ref 1.5–4.5)
Glucose: 119 mg/dL — ABNORMAL HIGH (ref 65–99)
Potassium: 3.5 mmol/L (ref 3.5–5.2)
Sodium: 138 mmol/L (ref 134–144)
Total Protein: 6.5 g/dL (ref 6.0–8.5)

## 2019-07-20 LAB — CBC
Hematocrit: 34.3 % (ref 34.0–46.6)
Hemoglobin: 11.5 g/dL (ref 11.1–15.9)
MCH: 31.2 pg (ref 26.6–33.0)
MCHC: 33.5 g/dL (ref 31.5–35.7)
MCV: 93 fL (ref 79–97)
Platelets: 369 10*3/uL (ref 150–450)
RBC: 3.69 x10E6/uL — ABNORMAL LOW (ref 3.77–5.28)
RDW: 12.4 % (ref 11.7–15.4)
WBC: 6.5 10*3/uL (ref 3.4–10.8)

## 2019-07-20 LAB — LIPID PANEL
Chol/HDL Ratio: 2.6 ratio (ref 0.0–4.4)
Cholesterol, Total: 183 mg/dL (ref 100–199)
HDL: 70 mg/dL (ref >39)
LDL Chol Calc (NIH): 94 mg/dL (ref 0–99)
Triglycerides: 105 mg/dL (ref 0–149)
VLDL Cholesterol Cal: 19 mg/dL (ref 5–40)

## 2019-07-20 LAB — VITAMIN D 25 HYDROXY (VIT D DEFICIENCY, FRACTURES): Vit D, 25-Hydroxy: 11.8 ng/mL — ABNORMAL LOW (ref 30.0–100.0)

## 2019-07-21 ENCOUNTER — Other Ambulatory Visit: Payer: Self-pay | Admitting: Nurse Practitioner

## 2019-07-21 MED ORDER — VITAMIN D (ERGOCALCIFEROL) 1.25 MG (50000 UNIT) PO CAPS: 50000.0000 [IU] | ORAL_CAPSULE | ORAL | 1 refills | Status: DC

## 2019-07-24 ENCOUNTER — Encounter: Payer: Self-pay | Admitting: Nurse Practitioner

## 2019-07-29 ENCOUNTER — Telehealth: Payer: Self-pay | Admitting: Nurse Practitioner

## 2019-07-29 NOTE — Telephone Encounter (Signed)
Patient called wanting to know if she can speak to someone  About a note for her job in regards to the flu shot. Patient states she has spoken to PCP in regards to this letter. Please follow up.

## 2019-08-02 NOTE — Telephone Encounter (Signed)
New Message  Pt calling to check on the status of  a note for her job in regards to the flu shot. Patient states she has spoken to PCP in regards to this letter. Please follow up.

## 2019-08-03 NOTE — Telephone Encounter (Signed)
I have not spoken to the patient regarding the flu vaccine. If her job requires the flu vaccine I am not able to give her a letter stating that she can not take this. Her health conditions due not warrant not getting the flu vaccine.

## 2019-08-05 NOTE — Telephone Encounter (Signed)
Attempt to reach patient to inform on PCP advising. No answer and Unable to LVM due to mailbox is full.

## 2019-08-17 ENCOUNTER — Other Ambulatory Visit: Payer: Self-pay | Admitting: Family Medicine

## 2019-08-17 DIAGNOSIS — M533 Sacrococcygeal disorders, not elsewhere classified: Secondary | ICD-10-CM

## 2019-08-29 ENCOUNTER — Other Ambulatory Visit: Payer: Self-pay | Admitting: Family Medicine

## 2019-08-29 ENCOUNTER — Other Ambulatory Visit: Payer: Self-pay

## 2019-08-29 ENCOUNTER — Ambulatory Visit: Payer: Commercial Managed Care - PPO | Attending: Nurse Practitioner | Admitting: *Deleted

## 2019-08-29 ENCOUNTER — Encounter: Payer: Self-pay | Admitting: Nurse Practitioner

## 2019-08-29 ENCOUNTER — Ambulatory Visit: Payer: Commercial Managed Care - PPO | Admitting: *Deleted

## 2019-08-29 DIAGNOSIS — Z23 Encounter for immunization: Secondary | ICD-10-CM

## 2019-08-29 DIAGNOSIS — I10 Essential (primary) hypertension: Secondary | ICD-10-CM

## 2019-08-29 NOTE — Progress Notes (Deleted)
   Assessment & Plan:  Diagnoses and all orders for this visit:  Encounter for Papanicolaou smear for cervical cancer screening -     PAP WITH EVERYTHING -     WET PREP    Patient has been counseled on age-appropriate routine health concerns for screening and prevention. These are reviewed and up-to-date. Referrals have been placed accordingly. Immunizations are up-to-date or declined.    Subjective:  No chief complaint on file.  HPI Latoya Juarez 48 y.o. female presents to office today   ROS  Past Medical History:  Diagnosis Date  . Anemia   . Fibroid   . Hypertension   . Obesity   . Prediabetes     Past Surgical History:  Procedure Laterality Date  . CESAREAN SECTION    . TUBAL LIGATION      Family History  Problem Relation Age of Onset  . Breast cancer Maternal Aunt   . Diabetes Mother   . Hypertension Mother   . Stroke Mother   . Hypertension Brother   . Diabetes Brother   . Stroke Brother     Social History Reviewed with no changes to be made today.   Outpatient Medications Prior to Visit  Medication Sig Dispense Refill  . escitalopram (LEXAPRO) 20 MG tablet Take 1 tablet (20 mg total) by mouth daily. 90 tablet 2  . lisinopril-hydrochlorothiazide (ZESTORETIC) 10-12.5 MG tablet Take 1 tablet by mouth daily. I10.0 90 tablet 3  . meloxicam (MOBIC) 7.5 MG tablet TAKE 1 TABLET BY MOUTH EVERY DAY 30 tablet 0  . metFORMIN (GLUCOPHAGE) 500 MG tablet Take 1 tablet (500 mg total) by mouth daily with breakfast. 90 tablet 1  . pravastatin (PRAVACHOL) 40 MG tablet TAKE 1 TABLET BY MOUTH EVERY DAY 90 tablet 0  . Vitamin D, Ergocalciferol, (DRISDOL) 1.25 MG (50000 UT) CAPS capsule Take 1 capsule (50,000 Units total) by mouth every 7 (seven) days. 12 capsule 1   No facility-administered medications prior to visit.     Allergies  Allergen Reactions  . Hydroxyzine Other (See Comments)    Nightmares       Objective:    There were no vitals taken for this visit.  Wt Readings from Last 3 Encounters:  07/19/19 200 lb (90.7 kg)  05/31/18 223 lb (101.2 kg)  05/12/18 223 lb 6.4 oz (101.3 kg)    Physical Exam       Patient has been counseled extensively about nutrition and exercise as well as the importance of adherence with medications and regular follow-up. The patient was given clear instructions to go to ER or return to medical center if symptoms don't improve, worsen or new problems develop. The patient verbalized understanding.   Follow-up: Return in about 4 months (around 12/27/2019).   [name], FNP-BC [facility] and [facility], [place]   08/29/2019, 10:28 AM

## 2019-08-29 NOTE — Progress Notes (Signed)
Patient came in for flu vaccine.

## 2019-08-29 NOTE — Progress Notes (Signed)
Patient came in for flu shot and tolerated well

## 2019-10-04 ENCOUNTER — Other Ambulatory Visit: Payer: Self-pay

## 2019-10-04 ENCOUNTER — Ambulatory Visit (HOSPITAL_BASED_OUTPATIENT_CLINIC_OR_DEPARTMENT_OTHER): Payer: Commercial Managed Care - PPO | Admitting: Nurse Practitioner

## 2019-10-04 ENCOUNTER — Other Ambulatory Visit (HOSPITAL_COMMUNITY)
Admission: RE | Admit: 2019-10-04 | Discharge: 2019-10-04 | Disposition: A | Discharge status: Home / Self Care | Payer: Commercial Managed Care - PPO | Source: Ambulatory Visit | Attending: Nurse Practitioner | Admitting: Nurse Practitioner

## 2019-10-04 ENCOUNTER — Encounter: Payer: Self-pay | Admitting: Nurse Practitioner

## 2019-10-04 VITALS — BP 101/66 | HR 94 | Temp 98.6°F | Ht 61.0 in | Wt 205.0 lb

## 2019-10-04 DIAGNOSIS — Z124 Encounter for screening for malignant neoplasm of cervix: Secondary | ICD-10-CM

## 2019-10-04 NOTE — Progress Notes (Signed)
Assessment & Plan:  Latoya Juarez was seen today for gynecologic exam.  Diagnoses and all orders for this visit:  Encounter for Papanicolaou smear for cervical cancer screening -     PAP WITH EVERYTHING -     WET PREP    Patient has been counseled on age-appropriate routine health concerns for screening and prevention. These are reviewed and up-to-date. Referrals have been placed accordingly. Immunizations are up-to-date or declined.    Subjective:   Chief Complaint  Patient presents with  . Gynecologic Exam    Pt. is here for a pap smear.    HPI Latoya Juarez 48 y.o. female presents to office today   Review of Systems  Constitutional: Negative.  Negative for chills, fever, malaise/fatigue and weight loss.  Respiratory: Negative.  Negative for cough, shortness of breath and wheezing.   Cardiovascular: Negative.  Negative for chest pain, orthopnea and leg swelling.  Gastrointestinal: Negative for abdominal pain.  Genitourinary: Negative.  Negative for flank pain.  Skin: Negative.  Negative for rash.  Psychiatric/Behavioral: Negative for suicidal ideas.    Past Medical History:  Diagnosis Date  . Anemia   . Fibroid   . Hypertension   . Obesity   . Prediabetes     Past Surgical History:  Procedure Laterality Date  . CESAREAN SECTION    . TUBAL LIGATION      Family History  Problem Relation Age of Onset  . Breast cancer Maternal Aunt   . Diabetes Mother   . Hypertension Mother   . Stroke Mother   . Hypertension Brother   . Diabetes Brother   . Stroke Brother     Social History Reviewed with no changes to be made today.   Outpatient Medications Prior to Visit  Medication Sig Dispense Refill  . escitalopram (LEXAPRO) 20 MG tablet Take 1 tablet (20 mg total) by mouth daily. 90 tablet 2  . lisinopril-hydrochlorothiazide (ZESTORETIC) 10-12.5 MG tablet Take 1 tablet by mouth daily. I10.0 90 tablet 3  . metFORMIN (GLUCOPHAGE) 500 MG tablet Take 1 tablet (500 mg  total) by mouth daily with breakfast. 90 tablet 1  . pravastatin (PRAVACHOL) 40 MG tablet TAKE 1 TABLET BY MOUTH EVERY DAY 90 tablet 0  . Vitamin D, Ergocalciferol, (DRISDOL) 1.25 MG (50000 UT) CAPS capsule Take 1 capsule (50,000 Units total) by mouth every 7 (seven) days. 12 capsule 1  . meloxicam (MOBIC) 7.5 MG tablet TAKE 1 TABLET BY MOUTH EVERY DAY (Patient not taking: Reported on 10/04/2019) 30 tablet 0   No facility-administered medications prior to visit.    Allergies  Allergen Reactions  . Hydroxyzine Other (See Comments)    Nightmares       Objective:    BP 101/66 (BP Location: Left Arm, Patient Position: Sitting, Cuff Size: Large)   Pulse 94   Temp 98.6 F (37 C) (Oral)   Ht 5\' 1"  (1.549 m)   Wt 205 lb (93 kg)   SpO2 95%   BMI 38.73 kg/m  Wt Readings from Last 3 Encounters:  10/04/19 205 lb (93 kg)  07/19/19 200 lb (90.7 kg)  05/31/18 223 lb (101.2 kg)    Physical Exam Constitutional:      Appearance: She is well-developed.  HENT:     Head: Normocephalic.  Cardiovascular:     Rate and Rhythm: Normal rate and regular rhythm.     Heart sounds: Normal heart sounds.  Pulmonary:     Effort: Pulmonary effort is normal.  Breath sounds: Normal breath sounds.  Abdominal:     General: Bowel sounds are normal.     Palpations: Abdomen is soft.     Hernia: There is no hernia in the left inguinal area.  Genitourinary:    Labia:        Right: No rash, tenderness, lesion or injury.        Left: No rash, tenderness, lesion or injury.      Vagina: Normal. No signs of injury and foreign body. No vaginal discharge, erythema, tenderness or bleeding.     Cervix: No cervical motion tenderness or friability.     Uterus: Not deviated and not enlarged.      Adnexa:        Right: No mass, tenderness or fullness.         Left: No mass, tenderness or fullness.       Rectum: Normal. No external hemorrhoid.  Lymphadenopathy:     Lower Body: No right inguinal adenopathy. No  left inguinal adenopathy.  Skin:    General: Skin is warm and dry.  Neurological:     Mental Status: She is alert and oriented to person, place, and time.  Psychiatric:        Behavior: Behavior normal.        Thought Content: Thought content normal.        Judgment: Judgment normal.          Patient has been counseled extensively about nutrition and exercise as well as the importance of adherence with medications and regular follow-up. The patient was given clear instructions to go to ER or return to medical center if symptoms don't improve, worsen or new problems develop. The patient verbalized understanding.   Follow-up: Return in about 3 months (around 01/02/2020).   [name], FNP-BC [facility] and [facility], [place]   10/04/2019, 9:46 AM

## 2019-10-05 ENCOUNTER — Other Ambulatory Visit: Payer: Self-pay | Admitting: Nurse Practitioner

## 2019-10-05 LAB — CERVICOVAGINAL ANCILLARY ONLY
Bacterial Vaginitis (gardnerella): POSITIVE — AB
Candida Glabrata: NEGATIVE
Candida Vaginitis: NEGATIVE
Chlamydia: NEGATIVE
Comment: NEGATIVE
Comment: NEGATIVE
Comment: NEGATIVE
Comment: NEGATIVE
Comment: NEGATIVE
Comment: NORMAL
Neisseria Gonorrhea: NEGATIVE
Trichomonas: NEGATIVE

## 2019-10-05 LAB — CYTOLOGY - PAP
Comment: NEGATIVE
Diagnosis: NEGATIVE
High risk HPV: NEGATIVE

## 2019-10-05 MED ORDER — METRONIDAZOLE 500 MG PO TABS: 500.0000 mg | ORAL_TABLET | Freq: Two times a day (BID) | ORAL | 0 refills | Status: AC

## 2019-10-08 ENCOUNTER — Other Ambulatory Visit: Payer: Self-pay | Admitting: Family Medicine

## 2019-10-08 DIAGNOSIS — M533 Sacrococcygeal disorders, not elsewhere classified: Secondary | ICD-10-CM

## 2019-10-15 ENCOUNTER — Other Ambulatory Visit: Payer: Self-pay | Admitting: Family Medicine

## 2019-10-15 DIAGNOSIS — R7303 Prediabetes: Secondary | ICD-10-CM

## 2019-11-16 ENCOUNTER — Telehealth: Payer: Self-pay | Admitting: Nurse Practitioner

## 2019-11-16 NOTE — Telephone Encounter (Signed)
Patient is requesting labwork. Bien please place orders. CMP/CBC. IF she is experiencing swelling and increased pain or warmth in the leg she will need a DVT ultrasound.

## 2019-11-16 NOTE — Telephone Encounter (Signed)
Will route to PCP 

## 2019-11-16 NOTE — Telephone Encounter (Signed)
Patient called requesting to speak with her nurse because she is having leg pain. Patient states that a nurse that works with her said that she needed to check her potassium due to her blood pressure medication. Please f/u

## 2019-11-18 NOTE — Telephone Encounter (Signed)
CMA scheduled patient to come in for lab on 11/21/2019. Pt. Stated her leg do not feel warm or swelling, just pain on leg with tightness.

## 2019-11-21 ENCOUNTER — Other Ambulatory Visit: Payer: Commercial Managed Care - PPO

## 2019-11-24 ENCOUNTER — Ambulatory Visit: Payer: Commercial Managed Care - PPO | Attending: Nurse Practitioner

## 2019-11-24 ENCOUNTER — Other Ambulatory Visit: Payer: Self-pay

## 2019-11-24 DIAGNOSIS — I10 Essential (primary) hypertension: Secondary | ICD-10-CM

## 2019-11-25 LAB — CMP14+EGFR
ALT: 16 IU/L (ref 0–32)
AST: 24 IU/L (ref 0–40)
Albumin/Globulin Ratio: 1.3 (ref 1.2–2.2)
Albumin: 4 g/dL (ref 3.8–4.8)
Alkaline Phosphatase: 70 IU/L (ref 39–117)
BUN/Creatinine Ratio: 10 (ref 9–23)
BUN: 8 mg/dL (ref 6–24)
Bilirubin Total: 0.7 mg/dL (ref 0.0–1.2)
CO2: 21 mmol/L (ref 20–29)
Calcium: 9.7 mg/dL (ref 8.7–10.2)
Chloride: 103 mmol/L (ref 96–106)
Creatinine, Ser: 0.84 mg/dL (ref 0.57–1.00)
GFR calc Af Amer: 95 mL/min/{1.73_m2} (ref >59)
GFR calc non Af Amer: 82 mL/min/{1.73_m2} (ref >59)
Globulin, Total: 3.1 g/dL (ref 1.5–4.5)
Glucose: 86 mg/dL (ref 65–99)
Potassium: 4 mmol/L (ref 3.5–5.2)
Sodium: 138 mmol/L (ref 134–144)
Total Protein: 7.1 g/dL (ref 6.0–8.5)

## 2019-11-25 LAB — CBC WITH DIFFERENTIAL/PLATELET
Basophils Absolute: 0.1 10*3/uL (ref 0.0–0.2)
Basos: 1 %
EOS (ABSOLUTE): 0.2 10*3/uL (ref 0.0–0.4)
Eos: 3 %
Hematocrit: 39.4 % (ref 34.0–46.6)
Hemoglobin: 12.6 g/dL (ref 11.1–15.9)
Immature Grans (Abs): 0 10*3/uL (ref 0.0–0.1)
Immature Granulocytes: 0 %
Lymphocytes Absolute: 1.7 10*3/uL (ref 0.7–3.1)
Lymphs: 22 %
MCH: 28.6 pg (ref 26.6–33.0)
MCHC: 32 g/dL (ref 31.5–35.7)
MCV: 90 fL (ref 79–97)
Monocytes Absolute: 0.6 10*3/uL (ref 0.1–0.9)
Monocytes: 7 %
Neutrophils Absolute: 5.1 10*3/uL (ref 1.4–7.0)
Neutrophils: 67 %
Platelets: 421 10*3/uL (ref 150–450)
RBC: 4.4 x10E6/uL (ref 3.77–5.28)
RDW: 14.7 % (ref 11.7–15.4)
WBC: 7.7 10*3/uL (ref 3.4–10.8)

## 2020-01-03 ENCOUNTER — Ambulatory Visit: Payer: Commercial Managed Care - PPO | Attending: Nurse Practitioner | Admitting: Nurse Practitioner

## 2020-01-03 ENCOUNTER — Encounter: Payer: Self-pay | Admitting: Nurse Practitioner

## 2020-01-03 ENCOUNTER — Other Ambulatory Visit: Payer: Self-pay

## 2020-01-03 DIAGNOSIS — M79662 Pain in left lower leg: Secondary | ICD-10-CM | POA: Diagnosis not present

## 2020-01-03 DIAGNOSIS — F329 Major depressive disorder, single episode, unspecified: Secondary | ICD-10-CM

## 2020-01-03 DIAGNOSIS — R7303 Prediabetes: Secondary | ICD-10-CM

## 2020-01-03 DIAGNOSIS — F32A Depression, unspecified: Secondary | ICD-10-CM

## 2020-01-03 DIAGNOSIS — I10 Essential (primary) hypertension: Secondary | ICD-10-CM

## 2020-01-03 DIAGNOSIS — F419 Anxiety disorder, unspecified: Secondary | ICD-10-CM

## 2020-01-03 DIAGNOSIS — E559 Vitamin D deficiency, unspecified: Secondary | ICD-10-CM

## 2020-01-03 DIAGNOSIS — E782 Mixed hyperlipidemia: Secondary | ICD-10-CM

## 2020-01-03 MED ORDER — PRAVASTATIN SODIUM 40 MG PO TABS: 40.0000 mg | ORAL_TABLET | Freq: Every day | ORAL | 0 refills | Status: DC

## 2020-01-03 MED ORDER — LISINOPRIL-HYDROCHLOROTHIAZIDE 10-12.5 MG PO TABS: 1.0000 | ORAL_TABLET | Freq: Every day | ORAL | 3 refills | Status: DC

## 2020-01-03 MED ORDER — VITAMIN D (ERGOCALCIFEROL) 1.25 MG (50000 UNIT) PO CAPS: 50000.0000 [IU] | ORAL_CAPSULE | ORAL | 1 refills | Status: DC

## 2020-01-03 MED ORDER — METFORMIN HCL 500 MG PO TABS: ORAL_TABLET | ORAL | 0 refills | Status: DC

## 2020-01-03 MED ORDER — ESCITALOPRAM OXALATE 20 MG PO TABS: 20.0000 mg | ORAL_TABLET | Freq: Every day | ORAL | 2 refills | Status: DC

## 2020-01-03 NOTE — Progress Notes (Signed)
Virtual Visit via Telephone Note Due to national recommendations of social distancing due to [place] 19, telehealth visit is felt to be most appropriate for this patient at this time.  I discussed the limitations, risks, security and privacy concerns of performing an evaluation and management service by telephone and the availability of in person appointments. I also discussed with the patient that there may be a patient responsible charge related to this service. The patient expressed understanding and agreed to proceed.    I connected with Latoya Juarez on 01/03/20  at   9:10 AM EDT  EDT by telephone and verified that I am speaking with the correct person using two identifiers.   Consent I discussed the limitations, risks, security and privacy concerns of performing an evaluation and management service by telephone and the availability of in person appointments. I also discussed with the patient that there may be a patient responsible charge related to this service. The patient expressed understanding and agreed to proceed.   Location of Patient: Private Residence   Location of Provider: [facility] participating in Telemedicine visit: Geryl Rankins FNP-BC [place]    History of Present Illness: Telemedicine visit for: Leg pain  Left calf pain. Onset last month. She initially thought it was due to her potassium being low. However her potassium was normal 11-2019.  Pain is worse with walking. She does walk a lot at work. There is intermittent swelling. She is smoking. Pain is intermittent and can last for minutes. Feels like a tightening sensation. There is no warmth or swelling.    Essential Hypertension Having headaches. BP is normal when she checks it. Denies chest pain, shortness of breath or visual disturbances.  BP Readings from Last 3 Encounters:  10/04/19 101/66  07/19/19 97/64  05/31/18 100/72     Past  Medical History:  Diagnosis Date  . Anemia   . Fibroid   . Hypertension   . Obesity   . Prediabetes     Past Surgical History:  Procedure Laterality Date  . CESAREAN SECTION    . TUBAL LIGATION      Family History  Problem Relation Age of Onset  . Breast cancer Maternal Aunt   . Diabetes Mother   . Hypertension Mother   . Stroke Mother   . Hypertension Brother   . Diabetes Brother   . Stroke Brother     Social History   Socioeconomic History  . Marital status: Legally Separated    Spouse name: Not on file  . Number of children: Not on file  . Years of education: Not on file  . Highest education level: Not on file  Occupational History  . Not on file  Tobacco Use  . Smoking status: Current Every Day Smoker    Packs/day: 0.00    Types: Cigarettes  . Smokeless tobacco: Never Used  Substance and Sexual Activity  . Alcohol use: Yes    Comment: socially  . Drug use: No  . Sexual activity: Yes    Birth control/protection: Surgical, Condom  Other Topics Concern  . Not on file  Social History Narrative  . Not on file   Social Determinants of Health   Financial Resource Strain:   . Difficulty of Paying Living Expenses:   Food Insecurity:   . Worried About Charity fundraiser in the Last Year:   . [place] in the Last Year:  Transportation Needs:   . Film/video editor (Medical):   Marland Kitchen Lack of Transportation (Non-Medical):   Physical Activity:   . Days of Exercise per Week:   . Minutes of Exercise per Session:   Stress:   . Feeling of Stress :   Social Connections:   . Frequency of Communication with Friends and Family:   . Frequency of Social Gatherings with Friends and Family:   . Attends Religious Services:   . Active Member of Clubs or Organizations:   . Attends Archivist Meetings:   Marland Kitchen Marital Status:      Observations/Objective: Awake, alert and oriented x 3   Review of Systems  Constitutional: Negative for fever,  malaise/fatigue and weight loss.  HENT: Negative.  Negative for nosebleeds.   Eyes: Negative.  Negative for blurred vision, double vision and photophobia.  Respiratory: Negative.  Negative for cough and shortness of breath.   Cardiovascular: Negative.  Negative for chest pain, palpitations and leg swelling.  Gastrointestinal: Negative.  Negative for heartburn, nausea and vomiting.  Musculoskeletal: Negative for myalgias.       SEE HPI  Neurological: Positive for headaches. Negative for dizziness, focal weakness and seizures.  Psychiatric/Behavioral: Positive for depression. Negative for suicidal ideas. The patient is nervous/anxious.     Assessment and Plan: Latoya Juarez was seen today for follow-up.  Diagnoses and all orders for this visit:  Pain of left calf Schedule ABI  Essential hypertension -     lisinopril-hydrochlorothiazide (ZESTORETIC) 10-12.5 MG tablet; Take 1 tablet by mouth daily. I10.0 Continue all antihypertensives as prescribed.  Remember to bring in your blood pressure log with you for your follow up appointment.  DASH/Mediterranean Diets are healthier choices for HTN.    Anxiety and depression -     escitalopram (LEXAPRO) 20 MG tablet; Take 1 tablet (20 mg total) by mouth daily.  Prediabetes -     metFORMIN (GLUCOPHAGE) 500 MG tablet; TAKE 1 TABLET (500 MG TOTAL) BY MOUTH 2 (TWO) TIMES DAILY WITH A MEAL.  Mixed hyperlipidemia -     pravastatin (PRAVACHOL) 40 MG tablet; Take 1 tablet (40 mg total) by mouth daily. INSTRUCTIONS: Work on a low fat, heart healthy diet and participate in regular aerobic exercise program by working out at least 150 minutes per week; 5 days a week-30 minutes per day. Avoid red meat/beef/steak,  fried foods. junk foods, sodas, sugary drinks, unhealthy snacking, alcohol and smoking.  Drink at least 80 oz of water per day and monitor your carbohydrate intake daily.    Vitamin D deficiency disease -     Vitamin D, Ergocalciferol, (DRISDOL) 1.25  MG (50000 UNIT) CAPS capsule; Take 1 capsule (50,000 Units total) by mouth every 7 (seven) days.     Follow Up Instructions Return for ABI.     I discussed the assessment and treatment plan with the patient. The patient was provided an opportunity to ask questions and all were answered. The patient agreed with the plan and demonstrated an understanding of the instructions.   The patient was advised to call back or seek an in-person evaluation if the symptoms worsen or if the condition fails to improve as anticipated.  I provided 16 minutes of non-face-to-face time during this encounter including median intraservice time, reviewing previous notes, labs, imaging, medications and explaining diagnosis and management.  [name], FNP-BC

## 2020-01-05 ENCOUNTER — Encounter: Payer: Self-pay | Admitting: Nurse Practitioner

## 2020-01-06 ENCOUNTER — Other Ambulatory Visit: Payer: Self-pay

## 2020-01-06 ENCOUNTER — Ambulatory Visit: Payer: Commercial Managed Care - PPO | Attending: Nurse Practitioner

## 2020-01-06 DIAGNOSIS — M79662 Pain in left lower leg: Secondary | ICD-10-CM

## 2020-01-06 LAB — POCT ABI - SCREENING FOR PILOT NO CHARGE
Left ABI: 1.17
Right ABI: 1.31

## 2020-01-06 NOTE — Progress Notes (Signed)
Pt. Is here for POCT of ABI for pain of her left calf.  ABI results were informed to PCP and patient is aware PCP suggested wearing compression stocking.  Pt. Understood and was also inform to call back in 2 weeks if it does not get better.

## 2020-04-06 ENCOUNTER — Ambulatory Visit: Payer: Commercial Managed Care - PPO | Admitting: Nurse Practitioner

## 2020-05-02 ENCOUNTER — Ambulatory Visit: Payer: Commercial Managed Care - PPO | Admitting: Nurse Practitioner

## 2020-05-21 ENCOUNTER — Other Ambulatory Visit: Payer: Self-pay | Admitting: Nurse Practitioner

## 2020-05-21 DIAGNOSIS — R7303 Prediabetes: Secondary | ICD-10-CM

## 2020-05-22 ENCOUNTER — Ambulatory Visit: Payer: Self-pay

## 2020-05-22 NOTE — Telephone Encounter (Addendum)
Pt. Reports she has been feeling "sad for a log time." Has  Had 2 deaths in her family. Denies any thoughts of harming herself or anyone else. States she is not eating or sleeping.Has an appointment this month with her PCP .[facility] [place] [facility] [place] states he will take her there now.  Reason for Disposition . Patient sounds very sick or weak to the triager  Answer Assessment - Initial Assessment Questions 1. CONCERN: "What happened that made you call today?"     I'm sad and tired of feeling this way. 2. DEPRESSION SYMPTOM SCREENING: "How are you feeling overall?" (e.g., decreased energy, increased sleeping or difficulty sleeping, difficulty concentrating, feelings of sadness, guilt, hopelessness, or worthlessness)     Not eating or sleeping. 3. RISK OF HARM - SUICIDAL IDEATION:  "Do you ever have thoughts of hurting or killing yourself?"  (e.g., yes, no, no but preoccupation with thoughts about death)   - INTENT:  "Do you have thoughts of hurting or killing yourself right NOW?" (e.g., yes, no, N/A)   - PLAN: "Do you have a specific plan for how you would do this?" (e.g., gun, knife, overdose, no plan, N/A)     No 4. RISK OF HARM - HOMICIDAL IDEATION:  "Do you ever have thoughts of hurting or killing someone else?"  (e.g., yes, no, no but preoccupation with thoughts about death)   - INTENT:  "Do you have thoughts of hurting or killing someone right NOW?" (e.g., yes, no, N/A)   - PLAN: "Do you have a specific plan for how you would do this?" (e.g., gun, knife, no plan, N/A)      No 5. FUNCTIONAL IMPAIRMENT: "How have things been going for you overall? Have you had more difficulty than usual doing your normal daily activities?"  (e.g., better, same, worse; self-care, school, work, interactions)     Not eating or sleeping 6. SUPPORT: "Who is with you now?" "Who do you live with?" "Do you have family or friends who you can talk to?"      My cousin 43.  THERAPIST: "Do you have a counselor or therapist? Name?"     No 8. STRESSORS: "Has there been any new stress or recent changes in your life?"     2 deaths in the family 10. ALCOHOL USE OR SUBSTANCE USE (DRUG USE): "Do you drink alcohol or use any illegal drugs?"     No 10. OTHER: "Do you have any other physical symptoms right now?" (e.g., fever)       No 11. PREGNANCY: "Is there any chance you are pregnant?" "When was your last menstrual period?"       No  Protocols used: DEPRESSION-A-AH

## 2020-05-24 ENCOUNTER — Telehealth: Payer: Self-pay | Admitting: Nurse Practitioner

## 2020-05-24 NOTE — Telephone Encounter (Signed)
Spoke to patient. Pt. Stated she had an appt. W/ a psychiatrist and have an upcoming appt. W/ her again on August 13th.  Lennie Odor on [facility] is who she had the appt w/. Pt. Stated the Lexapro was making her hallucination and she stopped it.

## 2020-05-24 NOTE — Telephone Encounter (Signed)
Copied from [place] [id number]. Topic: General - Other >> May 18, 2020  8:05 AM Alanda Slim E wrote: Reason for CRM: Pt called and asked to speak with Zelda/ Pt stated she needs to speak with her about an anxiety and depression medication/ pt was in tears/ please advise >> May 21, 2020 12:47 PM Keene Breath wrote: Patient is calling again to speak with her doctor about her anxiety and depression.  Please call patient to discuss.

## 2020-05-24 NOTE — Telephone Encounter (Signed)
Noted. Agree with plan for psychiatry

## 2020-06-06 ENCOUNTER — Ambulatory Visit: Payer: Commercial Managed Care - PPO | Attending: Nurse Practitioner | Admitting: Nurse Practitioner

## 2020-06-06 ENCOUNTER — Encounter: Payer: Self-pay | Admitting: Nurse Practitioner

## 2020-06-06 ENCOUNTER — Other Ambulatory Visit: Payer: Self-pay

## 2020-06-06 VITALS — BP 108/70 | HR 90 | Temp 97.7°F | Wt 211.0 lb

## 2020-06-06 DIAGNOSIS — E782 Mixed hyperlipidemia: Secondary | ICD-10-CM

## 2020-06-06 DIAGNOSIS — I10 Essential (primary) hypertension: Secondary | ICD-10-CM | POA: Diagnosis not present

## 2020-06-06 DIAGNOSIS — Z1231 Encounter for screening mammogram for malignant neoplasm of breast: Secondary | ICD-10-CM

## 2020-06-06 DIAGNOSIS — Z1159 Encounter for screening for other viral diseases: Secondary | ICD-10-CM

## 2020-06-06 DIAGNOSIS — R7303 Prediabetes: Secondary | ICD-10-CM

## 2020-06-06 LAB — POCT GLYCOSYLATED HEMOGLOBIN (HGB A1C): Hemoglobin A1C: 5.6 % (ref 4.0–5.6)

## 2020-06-06 LAB — GLUCOSE, POCT (MANUAL RESULT ENTRY): POC Glucose: 114 mg/dl — AB (ref 70–99)

## 2020-06-06 NOTE — Progress Notes (Signed)
Assessment & Plan:  Latoya Juarez was seen today for follow-up.  Diagnoses and all orders for this visit:  Essential hypertension -     Basic metabolic panel Continue all antihypertensives as prescribed.  Remember to bring in your blood pressure log with you for your follow up appointment.  DASH/Mediterranean Diets are healthier choices for HTN.    Prediabetes -     Glucose (CBG) -     HgB A1c -     Basic metabolic panel Continue blood sugar control as discussed in office today, low carbohydrate diet, and regular physical exercise as tolerated, 150 minutes per week (30 min each day, 5 days per week, or 50 min 3 days per week). Keep blood sugar logs with fasting goal of 90-130 mg/dl, post prandial (after you eat) less than 180.  For Hypoglycemia: BS <60 and Hyperglycemia BS >400; contact the clinic ASAP. Annual eye exams and foot exams are recommended.   Mixed hyperlipidemia -     Lipid panel INSTRUCTIONS: Work on a low fat, heart healthy diet and participate in regular aerobic exercise program by working out at least 150 minutes per week; 5 days a week-30 minutes per day. Avoid red meat/beef/steak,  fried foods. junk foods, sodas, sugary drinks, unhealthy snacking, alcohol and smoking.  Drink at least 80 oz of water per day and monitor your carbohydrate intake daily.    Need for hepatitis C screening test -     Hepatitis C Antibody  Breast cancer screening by mammogram -     MM 3D SCREEN BREAST BILATERAL; Future    Patient has been counseled on age-appropriate routine health concerns for screening and prevention. These are reviewed and up-to-date. Referrals have been placed accordingly. Immunizations are up-to-date or declined.    Subjective:   Chief Complaint  Patient presents with  . Follow-up    Pt. is here for 3 months F.U. on blood pressure and prediabetes.    HPI Latoya Juarez 49 y.o. female presents to office today for follow up to HTN and prediabetes. Doing well  today. Has no questions or concerns today.  Essential Hypertension Blood pressure is well controlled. She is taking lisinopril-hctz 10-12.5 mg daily as prescribed. Denies chest pain, shortness of breath, palpitations, lightheadedness, dizziness, headaches or BLE edema.  BP Readings from Last 3 Encounters:  06/06/20 108/70  10/04/19 101/66  07/19/19 97/64   Prediabetes Taking metformin 500 mg daily as prescribed. LDL not at goal. She is currently prescribed pravastatin 40 mg daily. Denies statin intolerance or myalgias.  Lab Results  Component Value Date   HGBA1C 5.6 06/06/2020   Lab Results  Component Value Date   LDLCALC 94 07/19/2019    Review of Systems  Constitutional: Negative for fever, malaise/fatigue and weight loss.  HENT: Negative.  Negative for nosebleeds.   Eyes: Negative.  Negative for blurred vision, double vision and photophobia.  Respiratory: Negative.  Negative for cough and shortness of breath.   Cardiovascular: Negative.  Negative for chest pain, palpitations and leg swelling.  Gastrointestinal: Negative.  Negative for heartburn, nausea and vomiting.  Musculoskeletal: Negative.  Negative for myalgias.  Neurological: Negative.  Negative for dizziness, focal weakness, seizures and headaches.  Psychiatric/Behavioral: Negative.  Negative for suicidal ideas.    Past Medical History:  Diagnosis Date  . Anemia   . Fibroid   . Hypertension   . Obesity   . Prediabetes     Past Surgical History:  Procedure Laterality Date  . CESAREAN SECTION    .  TUBAL LIGATION      Family History  Problem Relation Age of Onset  . Breast cancer Maternal Aunt   . Diabetes Mother   . Hypertension Mother   . Stroke Mother   . Hypertension Brother   . Diabetes Brother   . Stroke Brother     Social History Reviewed with no changes to be made today.   Outpatient Medications Prior to Visit  Medication Sig Dispense Refill  . lisinopril-hydrochlorothiazide (ZESTORETIC)  10-12.5 MG tablet Take 1 tablet by mouth daily. I10.0 90 tablet 3  . metFORMIN (GLUCOPHAGE) 500 MG tablet TAKE 1 TABLET BY MOUTH EVERY DAY WITH BREAKFAST 90 tablet 1  . pravastatin (PRAVACHOL) 40 MG tablet Take 1 tablet (40 mg total) by mouth daily. 90 tablet 0  . escitalopram (LEXAPRO) 20 MG tablet Take 1 tablet (20 mg total) by mouth daily. 90 tablet 2  . Vitamin D, Ergocalciferol, (DRISDOL) 1.25 MG (50000 UNIT) CAPS capsule Take 1 capsule (50,000 Units total) by mouth every 7 (seven) days. (Patient not taking: Reported on 06/06/2020) 12 capsule 1   No facility-administered medications prior to visit.    Allergies  Allergen Reactions  . Hydroxyzine Other (See Comments)    Nightmares       Objective:    BP 108/70 (BP Location: Left Arm, Patient Position: Sitting, Cuff Size: Normal)   Pulse 90   Temp 97.7 F (36.5 C) (Temporal)   Wt 211 lb (95.7 kg)   SpO2 96%   BMI 39.87 kg/m  Wt Readings from Last 3 Encounters:  06/06/20 211 lb (95.7 kg)  10/04/19 205 lb (93 kg)  07/19/19 200 lb (90.7 kg)    Physical Exam Vitals and nursing note reviewed.  Constitutional:      Appearance: She is well-developed.  HENT:     Head: Normocephalic and atraumatic.  Cardiovascular:     Rate and Rhythm: Normal rate and regular rhythm.     Heart sounds: Normal heart sounds. No murmur heard.  No friction rub. No gallop.   Pulmonary:     Effort: Pulmonary effort is normal. No tachypnea or respiratory distress.     Breath sounds: Normal breath sounds. No decreased breath sounds, wheezing, rhonchi or rales.  Chest:     Chest wall: No tenderness.  Abdominal:     General: Bowel sounds are normal.     Palpations: Abdomen is soft.  Musculoskeletal:        General: Normal range of motion.     Cervical back: Normal range of motion.  Skin:    General: Skin is warm and dry.  Neurological:     Mental Status: She is alert and oriented to person, place, and time.     Coordination: Coordination  normal.  Psychiatric:        Behavior: Behavior normal. Behavior is cooperative.        Thought Content: Thought content normal.        Judgment: Judgment normal.          Patient has been counseled extensively about nutrition and exercise as well as the importance of adherence with medications and regular follow-up. The patient was given clear instructions to go to ER or return to medical center if symptoms don't improve, worsen or new problems develop. The patient verbalized understanding.   Follow-up: Return in about 3 months (around 09/06/2020).   [name], FNP-BC [facility] and [facility], [place]   06/06/2020, 11:22 AM

## 2020-06-12 ENCOUNTER — Telehealth: Payer: Self-pay | Admitting: Licensed Clinical Social Worker

## 2020-06-12 NOTE — Telephone Encounter (Signed)
Call placed to patient.  Patient reports symptoms of depression anxiety triggered by grief. Pt denies current suicidal and/or homicidal ideations.   Validation and encouragement provided. Supportive resources for grief and depression management provided.    Patient was encouraged to schedule appointment with LCSW to assist with management of symptoms, if needed.  Patient verbalized understanding no additional concerns noted.

## 2020-06-20 ENCOUNTER — Other Ambulatory Visit: Payer: Self-pay | Admitting: Nurse Practitioner

## 2020-06-20 DIAGNOSIS — N6320 Unspecified lump in the left breast, unspecified quadrant: Secondary | ICD-10-CM

## 2020-08-15 ENCOUNTER — Other Ambulatory Visit: Payer: Self-pay

## 2020-08-15 ENCOUNTER — Ambulatory Visit: Payer: Commercial Managed Care - PPO | Attending: Nurse Practitioner

## 2020-08-15 VITALS — Temp 97.5°F

## 2020-08-15 DIAGNOSIS — Z23 Encounter for immunization: Secondary | ICD-10-CM

## 2020-08-15 NOTE — Progress Notes (Signed)
Pt seen in clinic for flu vaccine, administered in LD, pt tolerated well w/o adverse reaction noted, pt given info sheet and proof of vaccine for employer.

## 2020-08-15 NOTE — Addendum Note (Signed)
Addended by: Eddie Dibbles on: 08/15/2020 10:41 AM   Modules accepted: Level of Service

## 2020-09-07 ENCOUNTER — Ambulatory Visit: Payer: Commercial Managed Care - PPO | Admitting: Nurse Practitioner

## 2020-09-07 ENCOUNTER — Other Ambulatory Visit: Payer: Self-pay

## 2020-09-10 ENCOUNTER — Other Ambulatory Visit: Payer: Commercial Managed Care - PPO

## 2020-10-21 ENCOUNTER — Other Ambulatory Visit: Payer: Self-pay | Admitting: Nurse Practitioner

## 2020-10-21 DIAGNOSIS — E782 Mixed hyperlipidemia: Secondary | ICD-10-CM

## 2020-11-19 ENCOUNTER — Encounter: Payer: Commercial Managed Care - PPO | Admitting: Nurse Practitioner

## 2021-01-06 ENCOUNTER — Other Ambulatory Visit: Payer: Self-pay | Admitting: Nurse Practitioner

## 2021-01-06 DIAGNOSIS — E559 Vitamin D deficiency, unspecified: Secondary | ICD-10-CM

## 2021-01-06 NOTE — Telephone Encounter (Signed)
Requested medication (s) are due for refill today: yes  Requested medication (s) are on the active medication list: yes  Last refill:  01/03/20  Future visit scheduled: no  Notes to clinic:  med not delegated to NT to RF/Overdue lab work   Requested Prescriptions  Pending Prescriptions Disp Refills   Vitamin D, Ergocalciferol, (DRISDOL) 1.25 MG (50000 UNIT) CAPS capsule [Pharmacy Med Name: VITAMIN D2 1.25MG (50,000 UNIT)] 12 capsule 1    Sig: Take 1 capsule (50,000 Units total) by mouth every 7 (seven) days.      Endocrinology:  Vitamins - Vitamin D Supplementation Failed - 01/06/2021  9:14 AM      Failed - 50,000 IU strengths are not delegated      Failed - Ca in normal range and within 360 days    Calcium  Date Value Ref Range Status  11/24/2019 9.7 8.7 - 10.2 mg/dL Final          Failed - Phosphate in normal range and within 360 days    No results found for: PHOS        Failed - Vitamin D in normal range and within 360 days    Vit D, 25-Hydroxy  Date Value Ref Range Status  07/19/2019 11.8 (L) 30.0 - 100.0 ng/mL Final    Comment:    Vitamin D deficiency has been defined by the Institute of Medicine and an Endocrine Society practice guideline as a level of serum 25-OH vitamin D less than 20 ng/mL (1,2). The Endocrine Society went on to further define vitamin D insufficiency as a level between 21 and 29 ng/mL (2). 1. IOM (Institute of Medicine). 2010. Dietary reference    intakes for calcium and D. [place]: The    [employer]. 2. Holick MF, Binkley [place], Bischoff-Ferrari HA, et al.    Evaluation, treatment, and prevention of vitamin D    deficiency: an Endocrine Society clinical practice    guideline. JCEM. 2011 Jul; 96(7):1911-30.           Passed - Valid encounter within last 12 months    Recent Outpatient Visits           7 months ago Essential hypertension   [place], Vernia Buff, NP   1 year ago Pain of  left calf   [place], Vernia Buff, NP   1 year ago Encounter for Papanicolaou smear for cervical cancer screening   [place], Vernia Buff, NP   1 year ago Prediabetes   [place], NP   1 year ago Heat stroke, initial encounter   [facility] Charlott Rakes, MD

## 2021-01-22 ENCOUNTER — Ambulatory Visit: Payer: Commercial Managed Care - PPO

## 2021-01-22 ENCOUNTER — Other Ambulatory Visit: Payer: Self-pay

## 2021-01-22 NOTE — Progress Notes (Unsigned)
Established Patient Office Visit  Subjective:  Patient ID: Latoya Juarez, female    DOB: 1971-08-01  Age: 50 y.o. MRN: [id number]  CC: No chief complaint on file.   HPI Latoya Juarez presents for ***  Past Medical History:  Diagnosis Date  . Anemia   . Fibroid   . Hypertension   . Obesity   . Prediabetes     Past Surgical History:  Procedure Laterality Date  . CESAREAN SECTION    . TUBAL LIGATION      Family History  Problem Relation Age of Onset  . Breast cancer Maternal Aunt   . Diabetes Mother   . Hypertension Mother   . Stroke Mother   . Hypertension Brother   . Diabetes Brother   . Stroke Brother     Social History   Socioeconomic History  . Marital status: Legally Separated    Spouse name: Not on file  . Number of children: Not on file  . Years of education: Not on file  . Highest education level: Not on file  Occupational History  . Not on file  Tobacco Use  . Smoking status: Current Every Day Smoker    Packs/day: 0.00    Types: Cigarettes  . Smokeless tobacco: Never Used  Vaping Use  . Vaping Use: Never used  Substance and Sexual Activity  . Alcohol use: Yes    Comment: socially  . Drug use: No  . Sexual activity: Yes    Birth control/protection: Surgical, Condom  Other Topics Concern  . Not on file  Social History Narrative  . Not on file   Social Determinants of Health   Financial Resource Strain: Not on file  Food Insecurity: Not on file  Transportation Needs: Not on file  Physical Activity: Not on file  Stress: Not on file  Social Connections: Not on file  Intimate Partner Violence: Not on file    Outpatient Medications Prior to Visit  Medication Sig Dispense Refill  . escitalopram (LEXAPRO) 20 MG tablet Take 1 tablet (20 mg total) by mouth daily. 90 tablet 2  . lisinopril-hydrochlorothiazide (ZESTORETIC) 10-12.5 MG tablet Take 1 tablet by mouth daily. I10.0 90 tablet 3  . metFORMIN (GLUCOPHAGE) 500 MG tablet TAKE 1  TABLET BY MOUTH EVERY DAY WITH BREAKFAST 90 tablet 1  . pravastatin (PRAVACHOL) 40 MG tablet TAKE 1 TABLET BY MOUTH EVERY DAY 90 tablet 0  . Vitamin D, Ergocalciferol, (DRISDOL) 1.25 MG (50000 UNIT) CAPS capsule Take 1 capsule (50,000 Units total) by mouth every 7 (seven) days. (Patient not taking: Reported on 06/06/2020) 12 capsule 1   No facility-administered medications prior to visit.    Allergies  Allergen Reactions  . Hydroxyzine Other (See Comments)    Nightmares    ROS Review of Systems    Objective:    Physical Exam  There were no vitals taken for this visit. Wt Readings from Last 3 Encounters:  06/06/20 211 lb (95.7 kg)  10/04/19 205 lb (93 kg)  07/19/19 200 lb (90.7 kg)     Health Maintenance Due  Topic Date Due  . Hepatitis C Screening  Never done  . COVID-19 Vaccine (1) Never done  . COLONOSCOPY (Pts 45-88yrs Insurance coverage will need to be confirmed)  Never done  . TETANUS/TDAP  10/21/2019    There are no preventive care reminders to display for this patient.  Lab Results  Component Value Date   TSH 1.25 11/28/2016   Lab Results  Component  Value Date   WBC 7.7 11/24/2019   HGB 12.6 11/24/2019   HCT 39.4 11/24/2019   MCV 90 11/24/2019   PLT 421 11/24/2019   Lab Results  Component Value Date   NA 138 11/24/2019   K 4.0 11/24/2019   CO2 21 11/24/2019   GLUCOSE 86 11/24/2019   BUN 8 11/24/2019   CREATININE 0.84 11/24/2019   BILITOT 0.7 11/24/2019   ALKPHOS 70 11/24/2019   AST 24 11/24/2019   ALT 16 11/24/2019   PROT 7.1 11/24/2019   ALBUMIN 4.0 11/24/2019   CALCIUM 9.7 11/24/2019   ANIONGAP 11 06/11/2014   Lab Results  Component Value Date   CHOL 183 07/19/2019   Lab Results  Component Value Date   HDL 70 07/19/2019   Lab Results  Component Value Date   LDLCALC 94 07/19/2019   Lab Results  Component Value Date   TRIG 105 07/19/2019   Lab Results  Component Value Date   CHOLHDL 2.6 07/19/2019   Lab Results  Component  Value Date   HGBA1C 5.6 06/06/2020      Assessment & Plan:   Problem List Items Addressed This Visit   None     No orders of the defined types were placed in this encounter.   Follow-up: No follow-ups on file.    Loraine Grip Mayers, PA-C

## 2021-01-24 ENCOUNTER — Other Ambulatory Visit: Payer: Self-pay | Admitting: Nurse Practitioner

## 2021-01-24 DIAGNOSIS — E782 Mixed hyperlipidemia: Secondary | ICD-10-CM

## 2021-01-24 NOTE — Telephone Encounter (Signed)
Requested medications are due for refill today yes  Requested medications are on the active medication list yes  Last refill 10/22/20  Last visit 06/06/20, labs ordered but not drawn.  Future visit scheduled 03/27/21  Notes to clinic Failed protocol due to no valid visit within 12  months, labs are more than 4 days old.

## 2021-01-25 ENCOUNTER — Other Ambulatory Visit: Payer: Self-pay | Admitting: Nurse Practitioner

## 2021-01-25 DIAGNOSIS — R7303 Prediabetes: Secondary | ICD-10-CM

## 2021-01-25 DIAGNOSIS — E782 Mixed hyperlipidemia: Secondary | ICD-10-CM

## 2021-01-25 NOTE — Telephone Encounter (Signed)
Requested Prescriptions  Pending Prescriptions Disp Refills  . metFORMIN (GLUCOPHAGE) 500 MG tablet [Pharmacy Med Name: METFORMIN HCL 500 MG TABLET] 90 tablet 0    Sig: TAKE 1 TABLET BY MOUTH EVERY DAY WITH BREAKFAST     Endocrinology:  Diabetes - Biguanides Failed - 01/25/2021  1:21 AM      Failed - Cr in normal range and within 360 days    Creat  Date Value Ref Range Status  06/17/2016 0.61 0.50 - 1.10 mg/dL Final   Creatinine, Ser  Date Value Ref Range Status  11/24/2019 0.84 0.57 - 1.00 mg/dL Final         Failed - HBA1C is between 0 and 7.9 and within 180 days    Hemoglobin A1C  Date Value Ref Range Status  06/06/2020 5.6 4.0 - 5.6 % Final   Hgb A1c MFr Bld  Date Value Ref Range Status  12/31/2017 5.8 (H) 4.8 - 5.6 % Final    Comment:             Prediabetes: 5.7 - 6.4          Diabetes: >6.4          Glycemic control for adults with diabetes: <7.0          Failed - AA eGFR in normal range and within 360 days    GFR, Est African American  Date Value Ref Range Status  06/17/2016 >89 >=60 mL/min Final   GFR calc Af Amer  Date Value Ref Range Status  11/24/2019 95 >59 mL/min/1.73 Final   GFR, Est Non African American  Date Value Ref Range Status  06/17/2016 >89 >=60 mL/min Final   GFR calc non Af Amer  Date Value Ref Range Status  11/24/2019 82 >59 mL/min/1.73 Final         Failed - Valid encounter within last 6 months    Recent Outpatient Visits          7 months ago Essential hypertension   [place], Vernia Buff, NP   1 year ago Pain of left calf   [place], Vernia Buff, NP   1 year ago Encounter for Papanicolaou smear for cervical cancer screening   [place], Vernia Buff, NP   1 year ago Prediabetes   [place], Vernia Buff, NP   1 year ago Heat stroke, initial encounter   [place], Enobong, MD      Future Appointments            In 2 months [name], NP [place]

## 2021-01-28 ENCOUNTER — Other Ambulatory Visit: Payer: Self-pay | Admitting: Nurse Practitioner

## 2021-01-28 DIAGNOSIS — F419 Anxiety disorder, unspecified: Secondary | ICD-10-CM

## 2021-01-28 DIAGNOSIS — F32A Depression, unspecified: Secondary | ICD-10-CM

## 2021-01-28 DIAGNOSIS — I10 Essential (primary) hypertension: Secondary | ICD-10-CM

## 2021-01-28 MED ORDER — PRAVASTATIN SODIUM 40 MG PO TABS: 40.0000 mg | ORAL_TABLET | Freq: Every day | ORAL | 0 refills | Status: DC

## 2021-01-28 NOTE — Telephone Encounter (Signed)
Notes to clinic:  Patient schedule for appt on 03/27/2021 Review for supply until that time    Requested Prescriptions  Pending Prescriptions Disp Refills   lisinopril-hydrochlorothiazide (ZESTORETIC) 10-12.5 MG tablet [Pharmacy Med Name: LISINOPRIL-HCTZ 10-12.5 MG TAB] 90 tablet 3    Sig: TAKE 1 TABLET BY MOUTH DAILY      Cardiovascular:  ACEI + Diuretic Combos Failed - 01/28/2021  1:29 AM      Failed - Na in normal range and within 180 days    Sodium  Date Value Ref Range Status  11/24/2019 138 134 - 144 mmol/L Final          Failed - K in normal range and within 180 days    Potassium  Date Value Ref Range Status  11/24/2019 4.0 3.5 - 5.2 mmol/L Final          Failed - Cr in normal range and within 180 days    Creat  Date Value Ref Range Status  06/17/2016 0.61 0.50 - 1.10 mg/dL Final   Creatinine, Ser  Date Value Ref Range Status  11/24/2019 0.84 0.57 - 1.00 mg/dL Final          Failed - Ca in normal range and within 180 days    Calcium  Date Value Ref Range Status  11/24/2019 9.7 8.7 - 10.2 mg/dL Final          Failed - Valid encounter within last 6 months    Recent Outpatient Visits           7 months ago Essential hypertension   [place], Vernia Buff, NP   1 year ago Pain of left calf   [place], Vernia Buff, NP   1 year ago Encounter for Papanicolaou smear for cervical cancer screening   [place], Vernia Buff, NP   1 year ago [place], Vernia Buff, NP   1 year ago Heat stroke, initial encounter   [place], Enobong, MD       Future Appointments             In 1 month [name], NP [place] - Patient is not pregnant      Passed - Last BP in normal range    BP Readings from Last 1  Encounters:  06/06/20 108/70            escitalopram (LEXAPRO) 20 MG tablet [Pharmacy Med Name: ESCITALOPRAM 20 MG TABLET] 90 tablet 2    Sig: TAKE 1 [place]      Psychiatry:  Antidepressants - SSRI Failed - 01/28/2021  1:29 AM      Failed - Valid encounter within last 6 months    Recent Outpatient Visits           7 months ago Essential hypertension   [place], Vernia Buff, NP   1 year ago Pain of left calf   [place], Vernia Buff, NP   1 year ago Encounter for Papanicolaou smear for cervical cancer screening   [place], Vernia Buff, NP   1 year ago [place]  [facility] [name], NP   1 year ago Heat stroke, initial encounter   [place], MD       Future Appointments             In 1 month [name], NP [place]

## 2021-03-27 ENCOUNTER — Encounter: Payer: Commercial Managed Care - PPO | Admitting: Nurse Practitioner

## 2021-04-15 ENCOUNTER — Telehealth: Payer: Self-pay | Admitting: Nurse Practitioner

## 2021-04-15 ENCOUNTER — Other Ambulatory Visit: Payer: Self-pay | Admitting: Family Medicine

## 2021-04-15 DIAGNOSIS — R7303 Prediabetes: Secondary | ICD-10-CM

## 2021-04-15 DIAGNOSIS — I10 Essential (primary) hypertension: Secondary | ICD-10-CM

## 2021-04-15 NOTE — Telephone Encounter (Signed)
   Notes to clinic:  review for refill Appt scheduled for 05/08/2021 Due for follow up and labs   Requested Prescriptions  Pending Prescriptions Disp Refills   lisinopril-hydrochlorothiazide (ZESTORETIC) 10-12.5 MG tablet [Pharmacy Med Name: LISINOPRIL-HCTZ 10-12.5 MG TAB] 90 tablet 0    Sig: TAKE 1 TABLET BY MOUTH EVERY DAY      Cardiovascular:  ACEI + Diuretic Combos Failed - 04/15/2021  8:07 AM      Failed - Na in normal range and within 180 days    Sodium  Date Value Ref Range Status  11/24/2019 138 134 - 144 mmol/L Final          Failed - K in normal range and within 180 days    Potassium  Date Value Ref Range Status  11/24/2019 4.0 3.5 - 5.2 mmol/L Final          Failed - Cr in normal range and within 180 days    Creat  Date Value Ref Range Status  06/17/2016 0.61 0.50 - 1.10 mg/dL Final   Creatinine, Ser  Date Value Ref Range Status  11/24/2019 0.84 0.57 - 1.00 mg/dL Final          Failed - Ca in normal range and within 180 days    Calcium  Date Value Ref Range Status  11/24/2019 9.7 8.7 - 10.2 mg/dL Final          Failed - Valid encounter within last 6 months    Recent Outpatient Visits           10 months ago Essential hypertension   [place], Vernia Buff, NP   1 year ago Pain of left calf   [place], Vernia Buff, NP   1 year ago Encounter for Papanicolaou smear for cervical cancer screening   [place], Vernia Buff, NP   1 year ago [place], Vernia Buff, NP   1 year ago Heat stroke, initial encounter   [place], Enobong, MD       Future Appointments             In 3 weeks [name], NP [place] - Patient is not pregnant      Passed - Last BP in normal range    BP Readings from  Last 1 Encounters:  06/06/20 108/70

## 2021-04-15 NOTE — Telephone Encounter (Signed)
   Notes to clinic:  Patient scheduled for appt on 05/08/2021 Review for short supply    Requested Prescriptions  Pending Prescriptions Disp Refills   metFORMIN (GLUCOPHAGE) 500 MG tablet [Pharmacy Med Name: METFORMIN HCL 500 MG TABLET] 90 tablet 0    Sig: TAKE 1 TABLET BY [place]      Endocrinology:  Diabetes - Biguanides Failed - 04/15/2021  8:07 AM      Failed - Cr in normal range and within 360 days    Creat  Date Value Ref Range Status  06/17/2016 0.61 0.50 - 1.10 mg/dL Final   Creatinine, Ser  Date Value Ref Range Status  11/24/2019 0.84 0.57 - 1.00 mg/dL Final          Failed - HBA1C is between 0 and 7.9 and within 180 days    Hemoglobin A1C  Date Value Ref Range Status  06/06/2020 5.6 4.0 - 5.6 % Final   Hgb A1c MFr Bld  Date Value Ref Range Status  12/31/2017 5.8 (H) 4.8 - 5.6 % Final    Comment:             Prediabetes: 5.7 - 6.4          Diabetes: >6.4          Glycemic control for adults with diabetes: <7.0           Failed - AA eGFR in normal range and within 360 days    GFR, Est African American  Date Value Ref Range Status  06/17/2016 >89 >=60 mL/min Final   GFR calc Af Amer  Date Value Ref Range Status  11/24/2019 95 >59 mL/min/1.73 Final   GFR, Est Non African American  Date Value Ref Range Status  06/17/2016 >89 >=60 mL/min Final   GFR calc non Af Amer  Date Value Ref Range Status  11/24/2019 82 >59 mL/min/1.73 Final          Failed - Valid encounter within last 6 months    Recent Outpatient Visits           10 months ago Essential hypertension   [place], Vernia Buff, NP   1 year ago Pain of left calf   [place], Vernia Buff, NP   1 year ago Encounter for Papanicolaou smear for cervical cancer screening   [place], Vernia Buff, NP   1 year ago Prediabetes   [place], Vernia Buff, NP   1 year ago Heat stroke, initial encounter   [place], Enobong, MD       Future Appointments             In 3 weeks [name], NP [place]

## 2021-04-16 NOTE — Telephone Encounter (Signed)
Patient checking on the status of medication request and states she is out.

## 2021-04-17 NOTE — Telephone Encounter (Signed)
Patient has appointment but has not had labs since 11/24/2019. Refills will have to be approved by pt's PCP if appropriate.

## 2021-05-08 ENCOUNTER — Other Ambulatory Visit: Payer: Self-pay

## 2021-05-08 ENCOUNTER — Ambulatory Visit: Payer: Commercial Managed Care - PPO | Attending: Nurse Practitioner | Admitting: Nurse Practitioner

## 2021-05-08 ENCOUNTER — Encounter: Payer: Self-pay | Admitting: Nurse Practitioner

## 2021-05-08 DIAGNOSIS — Z1231 Encounter for screening mammogram for malignant neoplasm of breast: Secondary | ICD-10-CM

## 2021-05-08 DIAGNOSIS — F419 Anxiety disorder, unspecified: Secondary | ICD-10-CM

## 2021-05-08 DIAGNOSIS — I10 Essential (primary) hypertension: Secondary | ICD-10-CM

## 2021-05-08 DIAGNOSIS — Z1211 Encounter for screening for malignant neoplasm of colon: Secondary | ICD-10-CM

## 2021-05-08 DIAGNOSIS — R7303 Prediabetes: Secondary | ICD-10-CM | POA: Diagnosis not present

## 2021-05-08 DIAGNOSIS — E782 Mixed hyperlipidemia: Secondary | ICD-10-CM | POA: Diagnosis not present

## 2021-05-08 DIAGNOSIS — E559 Vitamin D deficiency, unspecified: Secondary | ICD-10-CM

## 2021-05-08 DIAGNOSIS — F32A Depression, unspecified: Secondary | ICD-10-CM

## 2021-05-08 DIAGNOSIS — Z1159 Encounter for screening for other viral diseases: Secondary | ICD-10-CM

## 2021-05-08 MED ORDER — METFORMIN HCL 500 MG PO TABS: 500.0000 mg | ORAL_TABLET | Freq: Every day | ORAL | 0 refills | Status: DC

## 2021-05-08 MED ORDER — LISINOPRIL-HYDROCHLOROTHIAZIDE 10-12.5 MG PO TABS: 1.0000 | ORAL_TABLET | Freq: Every day | ORAL | 0 refills | Status: DC

## 2021-05-08 MED ORDER — ESCITALOPRAM OXALATE 20 MG PO TABS: 20.0000 mg | ORAL_TABLET | Freq: Every day | ORAL | 0 refills | Status: DC

## 2021-05-08 MED ORDER — PRAVASTATIN SODIUM 40 MG PO TABS: 40.0000 mg | ORAL_TABLET | Freq: Every day | ORAL | 0 refills | Status: DC

## 2021-05-08 MED ORDER — VITAMIN D (ERGOCALCIFEROL) 1.25 MG (50000 UNIT) PO CAPS: 50000.0000 [IU] | ORAL_CAPSULE | ORAL | 1 refills | Status: DC

## 2021-05-08 NOTE — Progress Notes (Signed)
Virtual Visit via Telephone Note Due to national recommendations of social distancing due to [place] 19, telehealth visit is felt to be most appropriate for this patient at this time.  I discussed the limitations, risks, security and privacy concerns of performing an evaluation and management service by telephone and the availability of in person appointments. I also discussed with the patient that there may be a patient responsible charge related to this service. The patient expressed understanding and agreed to proceed.    I connected with Latoya Juarez on 05/08/21  at  10:10 AM EDT  EDT by telephone and verified that I am speaking with the correct person using two identifiers.  Location of Patient: Private Residence   Location of Provider: [place] [facility] [employer] Office    Persons participating in Telemedicine visit: Geryl Rankins FNP-BC Latoya Juarez    History of Present Illness: Telemedicine visit for: HTN She has a past medical history of Anemia, Fibroid, Hypertension, Obesity, and Prediabetes.   Essential Hypertension Blood pressure is well controlled. She needs refill of zestorectic 10-12.5 mg daily. Denies chest pain, shortness of breath, palpitations, lightheadedness, dizziness, headaches or BLE edema.   BP Readings from Last 3 Encounters:  06/06/20 108/70  10/04/19 101/66  07/19/19 97/64    Prediabetes Well controlled with metformin 500 mg daily. LDL not at goal with pravastatin 40 mg daily.  Lab Results  Component Value Date   HGBA1C 5.6 06/06/2020   Lab Results  Component Value Date   [place] 94 07/19/2019      Past Medical History:  Diagnosis Date   Anemia    Fibroid    Hypertension    Obesity    Prediabetes     Past Surgical History:  Procedure Laterality Date   CESAREAN SECTION     TUBAL LIGATION      Family History  Problem Relation Age of Onset   Breast cancer Maternal Aunt    Diabetes Mother    Hypertension Mother    Stroke  Mother    Hypertension Brother    Diabetes Brother    Stroke Brother     Social History   Socioeconomic History   Marital status: Legally Separated    Spouse name: Not on file   Number of children: Not on file   Years of education: Not on file   Highest education level: Not on file  Occupational History   Not on file  Tobacco Use   Smoking status: Every Day    Packs/day: 0.00    Types: Cigarettes   Smokeless tobacco: Never  Vaping Use   Vaping Use: Never used  Substance and Sexual Activity   Alcohol use: Yes    Comment: socially   Drug use: No   Sexual activity: Yes    Birth control/protection: Surgical, Condom  Other Topics Concern   Not on file  Social History Narrative   Not on file   Social Determinants of Health   Financial Resource Strain: Not on file  Food Insecurity: Not on file  Transportation Needs: Not on file  Physical Activity: Not on file  Stress: Not on file  Social Connections: Not on file     Observations/Objective: Awake, alert and oriented x 3   Review of Systems  Constitutional:  Negative for fever, malaise/fatigue and weight loss.  HENT: Negative.  Negative for nosebleeds.   Eyes: Negative.  Negative for blurred vision, double vision and photophobia.  Respiratory: Negative.  Negative for cough and shortness  of breath.   Cardiovascular: Negative.  Negative for chest pain, palpitations and leg swelling.  Gastrointestinal: Negative.  Negative for heartburn, nausea and vomiting.  Musculoskeletal: Negative.  Negative for myalgias.  Neurological: Negative.  Negative for dizziness, focal weakness, seizures and headaches.  Psychiatric/Behavioral: Negative.  Negative for suicidal ideas.    Assessment and Plan: Diagnoses and all orders for this visit:  Essential hypertension -     CMP14+EGFR; Future Continue all antihypertensives as prescribed.  Remember to bring in your blood pressure log with you for your follow up appointment.   DASH/Mediterranean Diets are healthier choices for HTN.    Prediabetes -     Hemoglobin A1c; Future  Mixed hyperlipidemia -     Lipid panel; Future INSTRUCTIONS: Work on a low fat, heart healthy diet and participate in regular aerobic exercise program by working out at least 150 minutes per week; 5 days a week-30 minutes per day. Avoid red meat/beef/steak,  fried foods. junk foods, sodas, sugary drinks, unhealthy snacking, alcohol and smoking.  Drink at least 80 oz of water per day and monitor your carbohydrate intake daily.    Need for hepatitis C screening test -     HCV Ab w Reflex to Quant PCR; Future  Colon cancer screening -     Ambulatory referral to Gastroenterology  Breast cancer screening by mammogram -     MM DIGITAL SCREENING BILATERAL; Future    Follow Up Instructions Return for Physical ONLY no labs.     I discussed the assessment and treatment plan with the patient. The patient was provided an opportunity to ask questions and all were answered. The patient agreed with the plan and demonstrated an understanding of the instructions.   The patient was advised to call back or seek an in-person evaluation if the symptoms worsen or if the condition fails to improve as anticipated.  I provided 10 minutes of non-face-to-face time during this encounter including median intraservice time, reviewing previous notes, labs, imaging, medications and explaining diagnosis and management.  [name], FNP-BC

## 2021-06-25 ENCOUNTER — Other Ambulatory Visit: Payer: Self-pay | Admitting: Nurse Practitioner

## 2021-06-25 DIAGNOSIS — I10 Essential (primary) hypertension: Secondary | ICD-10-CM

## 2021-06-25 NOTE — Telephone Encounter (Signed)
Medication Refill - Medication: lisinopril-hydrochlorothiazide (ZESTORETIC) 10-12.5 MG tablet  Has the patient contacted their pharmacy? Yes.   (Agent: If no, request that the patient contact the pharmacy for the refill.) (Agent: If yes, when and what did the pharmacy advise?)  Preferred Pharmacy (with phone number or street name):  [id number] Phone:  [phone] Fax:  [phone]     Agent: Please be advised that RX refills may take up to 3 business days. We ask that you follow-up with your pharmacy.

## 2021-06-26 ENCOUNTER — Encounter: Payer: Commercial Managed Care - PPO | Admitting: Nurse Practitioner

## 2021-06-26 NOTE — Telephone Encounter (Signed)
Requested medications are due for refill today.  yes  Requested medications are on the active medications list.  yes  Last refill. 05/08/2021  Future visit scheduled.   yes  Notes to clinic.  Failed protocol  - Labs are expired.

## 2021-07-05 ENCOUNTER — Other Ambulatory Visit: Payer: Self-pay | Admitting: Nurse Practitioner

## 2021-07-05 DIAGNOSIS — N63 Unspecified lump in unspecified breast: Secondary | ICD-10-CM

## 2021-07-25 ENCOUNTER — Ambulatory Visit: Payer: Commercial Managed Care - PPO | Attending: Nurse Practitioner

## 2021-07-25 ENCOUNTER — Other Ambulatory Visit: Payer: Self-pay

## 2021-07-25 DIAGNOSIS — I10 Essential (primary) hypertension: Secondary | ICD-10-CM

## 2021-07-25 DIAGNOSIS — E782 Mixed hyperlipidemia: Secondary | ICD-10-CM

## 2021-07-25 DIAGNOSIS — Z1159 Encounter for screening for other viral diseases: Secondary | ICD-10-CM

## 2021-07-25 DIAGNOSIS — R7303 Prediabetes: Secondary | ICD-10-CM

## 2021-07-26 LAB — CMP14+EGFR
ALT: 13 IU/L (ref 0–32)
AST: 22 IU/L (ref 0–40)
Albumin/Globulin Ratio: 1.3 (ref 1.2–2.2)
Albumin: 3.9 g/dL (ref 3.8–4.8)
Alkaline Phosphatase: 82 IU/L (ref 44–121)
BUN/Creatinine Ratio: 5 — ABNORMAL LOW (ref 9–23)
BUN: 4 mg/dL — ABNORMAL LOW (ref 6–24)
Bilirubin Total: 0.5 mg/dL (ref 0.0–1.2)
CO2: 20 mmol/L (ref 20–29)
Calcium: 9.2 mg/dL (ref 8.7–10.2)
Chloride: 105 mmol/L (ref 96–106)
Creatinine, Ser: 0.78 mg/dL (ref 0.57–1.00)
Globulin, Total: 2.9 g/dL (ref 1.5–4.5)
Glucose: 91 mg/dL (ref 70–99)
Potassium: 3.9 mmol/L (ref 3.5–5.2)
Sodium: 141 mmol/L (ref 134–144)
Total Protein: 6.8 g/dL (ref 6.0–8.5)
eGFR: 93 mL/min/{1.73_m2} (ref >59)

## 2021-07-26 LAB — LIPID PANEL
Chol/HDL Ratio: 3.7 ratio (ref 0.0–4.4)
Cholesterol, Total: 209 mg/dL — ABNORMAL HIGH (ref 100–199)
HDL: 56 mg/dL (ref >39)
LDL Chol Calc (NIH): 124 mg/dL — ABNORMAL HIGH (ref 0–99)
Triglycerides: 163 mg/dL — ABNORMAL HIGH (ref 0–149)
VLDL Cholesterol Cal: 29 mg/dL (ref 5–40)

## 2021-07-26 LAB — HEMOGLOBIN A1C
Est. average glucose Bld gHb Est-mCnc: 117 mg/dL
Hgb A1c MFr Bld: 5.7 % — ABNORMAL HIGH (ref 4.8–5.6)

## 2021-07-26 LAB — HCV AB W REFLEX TO QUANT PCR: HCV Ab: <0.1 s/co ratio (ref 0.0–0.9)

## 2021-07-26 LAB — HCV INTERPRETATION

## 2021-08-02 ENCOUNTER — Encounter: Payer: Self-pay | Admitting: Nurse Practitioner

## 2021-08-02 ENCOUNTER — Ambulatory Visit: Payer: Commercial Managed Care - PPO | Attending: Nurse Practitioner | Admitting: Nurse Practitioner

## 2021-08-02 ENCOUNTER — Other Ambulatory Visit: Payer: Self-pay

## 2021-08-02 VITALS — BP 188/113 | HR 90 | Ht 61.0 in | Wt 218.2 lb

## 2021-08-02 DIAGNOSIS — I10 Essential (primary) hypertension: Secondary | ICD-10-CM

## 2021-08-02 DIAGNOSIS — E782 Mixed hyperlipidemia: Secondary | ICD-10-CM

## 2021-08-02 DIAGNOSIS — R1907 Generalized intra-abdominal and pelvic swelling, mass and lump: Secondary | ICD-10-CM

## 2021-08-02 DIAGNOSIS — F32A Depression, unspecified: Secondary | ICD-10-CM

## 2021-08-02 DIAGNOSIS — H6192 Disorder of left external ear, unspecified: Secondary | ICD-10-CM

## 2021-08-02 DIAGNOSIS — Z0000 Encounter for general adult medical examination without abnormal findings: Secondary | ICD-10-CM

## 2021-08-02 DIAGNOSIS — E559 Vitamin D deficiency, unspecified: Secondary | ICD-10-CM

## 2021-08-02 DIAGNOSIS — F419 Anxiety disorder, unspecified: Secondary | ICD-10-CM

## 2021-08-02 DIAGNOSIS — R7303 Prediabetes: Secondary | ICD-10-CM

## 2021-08-02 MED ORDER — PRAVASTATIN SODIUM 40 MG PO TABS: 40.0000 mg | ORAL_TABLET | Freq: Every day | ORAL | 1 refills | Status: DC

## 2021-08-02 MED ORDER — VITAMIN D (ERGOCALCIFEROL) 1.25 MG (50000 UNIT) PO CAPS: 50000.0000 [IU] | ORAL_CAPSULE | ORAL | 1 refills | Status: DC

## 2021-08-02 MED ORDER — ESCITALOPRAM OXALATE 20 MG PO TABS: 20.0000 mg | ORAL_TABLET | Freq: Every day | ORAL | 1 refills | Status: DC

## 2021-08-02 MED ORDER — METFORMIN HCL 500 MG PO TABS: 500.0000 mg | ORAL_TABLET | Freq: Every day | ORAL | 1 refills | Status: DC

## 2021-08-02 MED ORDER — LISINOPRIL-HYDROCHLOROTHIAZIDE 10-12.5 MG PO TABS: 1.0000 | ORAL_TABLET | Freq: Every day | ORAL | 1 refills | Status: DC

## 2021-08-02 NOTE — Progress Notes (Addendum)
Assessment & Plan:  Latoya Juarez was seen today for annual exam.  Diagnoses and all orders for this visit:  Encounter for annual physical exam  Generalized abdominal or pelvic swelling or mass or lump -     US PELVIC COMPLETE WITH TRANSVAGINAL; Future  Anxiety and depression -     escitalopram (LEXAPRO) 20 MG tablet; Take 1 tablet (20 mg total) by mouth daily.  Essential hypertension -     lisinopril-hydrochlorothiazide (ZESTORETIC) 10-12.5 MG tablet; Take 1 tablet by mouth daily. Continue all antihypertensives as prescribed.  Remember to bring in your blood pressure log with you for your follow up appointment.  DASH/Mediterranean Diets are healthier choices for HTN.    Prediabetes -     metFORMIN (GLUCOPHAGE) 500 MG tablet; Take 1 tablet (500 mg total) by mouth daily with breakfast. Continue blood sugar control as discussed in office today, low carbohydrate diet, and regular physical exercise as tolerated, 150 minutes per week (30 min each day, 5 days per week, or 50 min 3 days per week).    Vitamin D deficiency disease -     Vitamin D, Ergocalciferol, (DRISDOL) 1.25 MG (50000 UNIT) CAPS capsule; Take 1 capsule (50,000 Units total) by mouth every 7 (seven) days.  Mixed hyperlipidemia -     pravastatin (PRAVACHOL) 40 MG tablet; Take 1 tablet (40 mg total) by mouth daily. INSTRUCTIONS: Work on a low fat, heart healthy diet and participate in regular aerobic exercise program by working out at least 150 minutes per week; 5 days a week-30 minutes per day. Avoid red meat/beef/steak,  fried foods. junk foods, sodas, sugary drinks, unhealthy snacking, alcohol and smoking.  Drink at least 80 oz of water per day and monitor your carbohydrate intake daily.    Lesion of skin of left ear -     Ambulatory referral to Dermatology   Patient has been counseled on age-appropriate routine health concerns for screening and prevention. These are reviewed and up-to-date. Referrals have been placed  accordingly. Immunizations are up-to-date or declined.    Subjective:   Chief Complaint  Patient presents with   Annual Exam   HPI Latoya Juarez 50 y.o. female presents to office today for annual physical exam. She has a past medical history of Anemia, Fibroid, Hypertension, Obesity, and Prediabetes.    HTN Blood pressure medications have not been picked up from the pharmacy since 01-2021 (I verified myself today). She is currently prescribed Zestoretic 10-12.5 mg daily. Denies chest pain, shortness of breath, palpitations, lightheadedness, dizziness, headaches or BLE edema.   BP Readings from Last 3 Encounters:  08/02/21 (!) 188/113  06/06/20 108/70  10/04/19 101/66     Prediabetes Well controlled with metformin 500 mg daily as prescribed. LDL is not well controlled. She is prescribed pravastatin 40 mg daily.  Lab Results  Component Value Date   HGBA1C 5.7 (H) 07/25/2021   Lab Results  Component Value Date   LDLCALC 124 (H) 07/25/2021    Cyst of left ear She noticed nodule on left ear over a month ago. Nodule is painless but appears to be increasing in size. She denies any new or recent ear piercing.     Review of Systems  Constitutional:  Negative for fever, malaise/fatigue and weight loss.  HENT: Negative.  Negative for nosebleeds.   Eyes: Negative.  Negative for blurred vision, double vision and photophobia.  Respiratory: Negative.  Negative for cough and shortness of breath.   Cardiovascular: Negative.  Negative for chest pain,  palpitations and leg swelling.  Gastrointestinal: Negative.  Negative for heartburn, nausea and vomiting.  Genitourinary: Negative.   Musculoskeletal: Negative.  Negative for myalgias.  Skin:        LEFT EAR NODULE  Neurological: Negative.  Negative for dizziness, focal weakness, seizures and headaches.  Endo/Heme/Allergies: Negative.   Psychiatric/Behavioral: Negative.  Negative for suicidal ideas.    Past Medical History:  Diagnosis  Date   Anemia    Fibroid    Hypertension    Obesity    Prediabetes     Past Surgical History:  Procedure Laterality Date   CESAREAN SECTION     TUBAL LIGATION      Family History  Problem Relation Age of Onset   Breast cancer Maternal Aunt    Diabetes Mother    Hypertension Mother    Stroke Mother    Hypertension Brother    Diabetes Brother    Stroke Brother     Social History Reviewed with no changes to be made today.   Outpatient Medications Prior to Visit  Medication Sig Dispense Refill   lisinopril-hydrochlorothiazide (ZESTORETIC) 10-12.5 MG tablet Take 1 tablet by mouth daily. 90 tablet 0   metFORMIN (GLUCOPHAGE) 500 MG tablet Take 1 tablet (500 mg total) by mouth daily with breakfast. 90 tablet 0   pravastatin (PRAVACHOL) 40 MG tablet Take 1 tablet (40 mg total) by mouth daily. 90 tablet 0   Vitamin D, Ergocalciferol, (DRISDOL) 1.25 MG (50000 UNIT) CAPS capsule Take 1 capsule (50,000 Units total) by mouth every 7 (seven) days. 12 capsule 1   escitalopram (LEXAPRO) 20 MG tablet Take 1 tablet (20 mg total) by mouth daily. (Patient not taking: Reported on 08/02/2021) 90 tablet 0   No facility-administered medications prior to visit.    Allergies  Allergen Reactions   Hydroxyzine Other (See Comments)    Nightmares       Objective:    BP (!) 188/113   Pulse 90   Ht 5\' 1"  (1.549 m)   Wt 218 lb 4 oz (99 kg)   LMP 07/23/2021 (Approximate)   SpO2 97%   BMI 41.24 kg/m  Wt Readings from Last 3 Encounters:  08/02/21 218 lb 4 oz (99 kg)  06/06/20 211 lb (95.7 kg)  10/04/19 205 lb (93 kg)    Physical Exam Constitutional:      Appearance: She is well-developed.  HENT:     Head: Normocephalic and atraumatic.     Right Ear: External ear normal.     Left Ear: External ear normal.     Nose: Nose normal.     Mouth/Throat:     Pharynx: No oropharyngeal exudate.  Eyes:     General: Lids are normal. No scleral icterus.       Right eye: No discharge.      Conjunctiva/sclera: Conjunctivae normal.     Pupils: Pupils are equal, round, and reactive to light.     Comments: Left ear nodule  Neck:     Thyroid: No thyromegaly.     Trachea: No tracheal deviation.  Cardiovascular:     Rate and Rhythm: Normal rate and regular rhythm.     Heart sounds: Normal heart sounds. No murmur heard.   No friction rub.  Pulmonary:     Effort: Pulmonary effort is normal. No accessory muscle usage or respiratory distress.     Breath sounds: Normal breath sounds. No decreased breath sounds, wheezing, rhonchi or rales.  Abdominal:     General: Abdomen is  protuberant. Bowel sounds are normal. There is no distension.     Palpations: Abdomen is soft. There is mass (5cm slightly mobile mass in the upper right abdomen).     Tenderness: There is no abdominal tenderness. There is no guarding or rebound.  Musculoskeletal:        General: No tenderness or deformity. Normal range of motion.     Cervical back: Normal range of motion and neck supple.  Lymphadenopathy:     Cervical: No cervical adenopathy.  Skin:    General: Skin is warm and dry.     Findings: No erythema.  Neurological:     Mental Status: She is alert and oriented to person, place, and time.     Cranial Nerves: No cranial nerve deficit.     Coordination: Coordination normal.     Deep Tendon Reflexes: Reflexes are normal and symmetric.  Psychiatric:        Speech: Speech normal.        Behavior: Behavior normal.        Thought Content: Thought content normal.        Judgment: Judgment normal.         Patient has been counseled extensively about nutrition and exercise as well as the importance of adherence with medications and regular follow-up. The patient was given clear instructions to go to ER or return to medical center if symptoms don't improve, worsen or new problems develop. The patient verbalized understanding.   Follow-up: Return in about 4 weeks (around 08/30/2021) for 4 weeks BP CHECK  WITH LUKE. SEE ME IN 3 months.   [name], FNP-BC [facility] [place], [place]   08/02/2021, 2:47 PM

## 2021-08-07 ENCOUNTER — Telehealth: Payer: Self-pay | Admitting: Nurse Practitioner

## 2021-08-07 ENCOUNTER — Other Ambulatory Visit: Payer: Self-pay

## 2021-08-07 DIAGNOSIS — E782 Mixed hyperlipidemia: Secondary | ICD-10-CM

## 2021-08-07 DIAGNOSIS — F419 Anxiety disorder, unspecified: Secondary | ICD-10-CM

## 2021-08-07 DIAGNOSIS — R7303 Prediabetes: Secondary | ICD-10-CM

## 2021-08-07 DIAGNOSIS — E559 Vitamin D deficiency, unspecified: Secondary | ICD-10-CM

## 2021-08-07 DIAGNOSIS — I10 Essential (primary) hypertension: Secondary | ICD-10-CM

## 2021-08-07 MED ORDER — ESCITALOPRAM OXALATE 20 MG PO TABS
20.0000 mg | ORAL_TABLET | Freq: Every day | ORAL | 1 refills | Status: DC
  Filled 2021-08-07: qty 30, 30d supply, fill #0

## 2021-08-07 MED ORDER — METFORMIN HCL 500 MG PO TABS
500.0000 mg | ORAL_TABLET | Freq: Every day | ORAL | 1 refills | Status: DC
  Filled 2021-08-07: qty 30, 30d supply, fill #0
  Filled 2021-09-23 – 2021-09-30 (×2): qty 30, 30d supply, fill #1
  Filled 2021-11-19: qty 30, 30d supply, fill #0
  Filled 2021-12-31 – 2022-01-10 (×2): qty 30, 30d supply, fill #1
  Filled 2022-02-06: qty 30, 30d supply, fill #2
  Filled 2022-03-19: qty 30, 30d supply, fill #3

## 2021-08-07 MED ORDER — LISINOPRIL-HYDROCHLOROTHIAZIDE 10-12.5 MG PO TABS
1.0000 | ORAL_TABLET | Freq: Every day | ORAL | 1 refills | Status: DC
  Filled 2021-08-07: qty 30, 30d supply, fill #0
  Filled 2021-09-23 – 2021-09-30 (×2): qty 30, 30d supply, fill #1
  Filled 2021-11-19: qty 30, 30d supply, fill #0
  Filled 2021-12-31 – 2022-01-10 (×2): qty 30, 30d supply, fill #1
  Filled 2022-02-06: qty 30, 30d supply, fill #2
  Filled 2022-03-19: qty 30, 30d supply, fill #3

## 2021-08-07 MED ORDER — PRAVASTATIN SODIUM 40 MG PO TABS
40.0000 mg | ORAL_TABLET | Freq: Every day | ORAL | 1 refills | Status: DC
  Filled 2021-08-07 – 2021-11-19 (×2): qty 30, 30d supply, fill #0
  Filled 2021-12-31 – 2022-01-10 (×2): qty 30, 30d supply, fill #1
  Filled 2022-02-06: qty 30, 30d supply, fill #2
  Filled 2022-03-19: qty 30, 30d supply, fill #3

## 2021-08-07 MED ORDER — VITAMIN D (ERGOCALCIFEROL) 1.25 MG (50000 UNIT) PO CAPS
50000.0000 [IU] | ORAL_CAPSULE | ORAL | 1 refills | Status: DC
  Filled 2021-08-07: qty 12, 84d supply, fill #0
  Filled 2021-11-19: qty 4, 28d supply, fill #0
  Filled 2022-01-10: qty 4, 28d supply, fill #1

## 2021-08-07 NOTE — Telephone Encounter (Signed)
Copied from [place] [id number]. Topic: General - Other >> Aug 06, 2021  3:18 PM Yvette Rack wrote: Reason for CRM: Pt stated she needs provider to call her because she was not able to have any of her prescriptions filled. Cb# [phone]

## 2021-08-07 NOTE — Telephone Encounter (Signed)
Pt stated she lost her job and could not afford her medication when she went to pick them up Friday . She was wondering if you could send them here to our pharmacy instead of [facility]?

## 2021-08-08 ENCOUNTER — Other Ambulatory Visit: Payer: Self-pay

## 2021-08-12 ENCOUNTER — Ambulatory Visit (HOSPITAL_COMMUNITY)
Admission: RE | Admit: 2021-08-12 | Discharge: 2021-08-12 | Disposition: A | Discharge status: Home / Self Care | Payer: Self-pay | Source: Ambulatory Visit | Attending: Nurse Practitioner | Admitting: Nurse Practitioner

## 2021-08-12 ENCOUNTER — Other Ambulatory Visit: Payer: Self-pay

## 2021-08-12 DIAGNOSIS — R1907 Generalized intra-abdominal and pelvic swelling, mass and lump: Secondary | ICD-10-CM | POA: Insufficient documentation

## 2021-08-20 ENCOUNTER — Encounter: Payer: Self-pay | Admitting: Nurse Practitioner

## 2021-08-20 ENCOUNTER — Telehealth (HOSPITAL_BASED_OUTPATIENT_CLINIC_OR_DEPARTMENT_OTHER): Payer: Self-pay | Admitting: Nurse Practitioner

## 2021-08-20 DIAGNOSIS — R1900 Intra-abdominal and pelvic swelling, mass and lump, unspecified site: Secondary | ICD-10-CM

## 2021-08-20 DIAGNOSIS — R1901 Right upper quadrant abdominal swelling, mass and lump: Secondary | ICD-10-CM

## 2021-08-20 NOTE — Progress Notes (Signed)
Appt made for Nov 8 at 9:45 @ [facility]

## 2021-08-20 NOTE — Progress Notes (Signed)
Virtual Visit via Telephone Note Due to national recommendations of social distancing due to [name], telehealth visit is felt to be most appropriate for this patient at this time.  I discussed the limitations, risks, security and privacy concerns of performing an evaluation and management service by telephone and the availability of in person appointments. I also discussed with the patient that there may be a patient responsible charge related to this service. The patient expressed understanding and agreed to proceed.    I connected with Latoya Juarez on 08/20/21  at   9:30 AM EDT  EDT by telephone and verified that I am speaking with the correct person using two identifiers.  Location of Patient: Private Residence   Location of Provider: [place] and [employer] Office    Persons participating in Telemedicine visit: Geryl Rankins FNP-BC Latoya Juarez    History of Present Illness: Telemedicine visit for: Abdominal mass She has a past medical history of Anemia, Fibroid, Hypertension, Obesity, and Prediabetes.   [name] had her annual physical with me on 08-02-2021 and at that time a palpable abdominal mass was noted on exam. US of the pelvis did not reveal any right sided pelvic masses that would correlate with palpated mass. Will proceed with [place] of abdomen at this time. She does endorse abdominal discomfort but denies abnormal uterine bleeding, constipation  or nausea and vomiting.     Past Medical History:  Diagnosis Date   Anemia    Fibroid    Hypertension    Obesity    Prediabetes     Past Surgical History:  Procedure Laterality Date   CESAREAN SECTION     TUBAL LIGATION      Family History  Problem Relation Age of Onset   Breast cancer Maternal Aunt    Diabetes Mother    Hypertension Mother    Stroke Mother    Hypertension Brother    Diabetes Brother    Stroke Brother     Social History   Socioeconomic History   Marital status: Legally  Separated    Spouse name: Not on file   Number of children: Not on file   Years of education: Not on file   Highest education level: Not on file  Occupational History   Not on file  Tobacco Use   Smoking status: Every Day    Packs/day: 0.00    Types: Cigarettes   Smokeless tobacco: Never  Vaping Use   Vaping Use: Never used  Substance and Sexual Activity   Alcohol use: Yes    Comment: socially   Drug use: No   Sexual activity: Yes    Birth control/protection: Surgical, Condom  Other Topics Concern   Not on file  Social History Narrative   Not on file   Social Determinants of Health   Financial Resource Strain: Not on file  Food Insecurity: Not on file  Transportation Needs: Not on file  Physical Activity: Not on file  Stress: Not on file  Social Connections: Not on file     Observations/Objective: Awake, alert and oriented x 3   Review of Systems  Constitutional:  Negative for fever, malaise/fatigue and weight loss.  HENT: Negative.  Negative for nosebleeds.   Eyes: Negative.  Negative for blurred vision, double vision and photophobia.  Respiratory: Negative.  Negative for cough and shortness of breath.   Cardiovascular: Negative.  Negative for chest pain, palpitations and leg swelling.  Gastrointestinal:  Positive for abdominal pain. Negative for  blood in stool, constipation, diarrhea, heartburn, melena, nausea and vomiting.  Musculoskeletal: Negative.  Negative for myalgias.  Neurological: Negative.  Negative for dizziness, focal weakness, seizures and headaches.  Psychiatric/Behavioral: Negative.  Negative for suicidal ideas.    Assessment and Plan: Diagnoses and all orders for this visit:  Right upper quadrant abdominal mass -     US Abdomen Complete; Future  Palpable abdominal mass -     US Abdomen Complete; Future    Follow Up Instructions Return if symptoms worsen or fail to improve.     I discussed the assessment and treatment plan with the  patient. The patient was provided an opportunity to ask questions and all were answered. The patient agreed with the plan and demonstrated an understanding of the instructions.   The patient was advised to call back or seek an in-person evaluation if the symptoms worsen or if the condition fails to improve as anticipated.  I provided 10 minutes of non-face-to-face time during this encounter including median intraservice time, reviewing previous notes, labs, imaging, medications and explaining diagnosis and management.  Latoya Pounds, FNP-BC

## 2021-08-27 ENCOUNTER — Ambulatory Visit (HOSPITAL_COMMUNITY): Payer: Self-pay

## 2021-08-30 ENCOUNTER — Other Ambulatory Visit: Payer: Self-pay | Admitting: Nurse Practitioner

## 2021-08-30 ENCOUNTER — Ambulatory Visit (HOSPITAL_COMMUNITY)
Admission: RE | Admit: 2021-08-30 | Discharge: 2021-08-30 | Disposition: A | Discharge status: Home / Self Care | Payer: Self-pay | Source: Ambulatory Visit | Attending: Nurse Practitioner | Admitting: Nurse Practitioner

## 2021-08-30 DIAGNOSIS — R1901 Right upper quadrant abdominal swelling, mass and lump: Secondary | ICD-10-CM | POA: Insufficient documentation

## 2021-08-30 DIAGNOSIS — R1900 Intra-abdominal and pelvic swelling, mass and lump, unspecified site: Secondary | ICD-10-CM | POA: Insufficient documentation

## 2021-09-03 ENCOUNTER — Encounter: Payer: Self-pay | Admitting: Nurse Practitioner

## 2021-09-03 ENCOUNTER — Ambulatory Visit (HOSPITAL_BASED_OUTPATIENT_CLINIC_OR_DEPARTMENT_OTHER): Payer: Self-pay | Admitting: Nurse Practitioner

## 2021-09-03 DIAGNOSIS — R1903 Right lower quadrant abdominal swelling, mass and lump: Secondary | ICD-10-CM

## 2021-09-03 NOTE — Progress Notes (Signed)
Virtual Visit via Telephone Note Due to national recommendations of social distancing due to [place] 19, telehealth visit is felt to be most appropriate for this patient at this time.  I discussed the limitations, risks, security and privacy concerns of performing an evaluation and management service by telephone and the availability of in person appointments. I also discussed with the patient that there may be a patient responsible charge related to this service. The patient expressed understanding and agreed to proceed.    I connected with Latoya Juarez on 09/03/21  at   8:30 AM EST  EDT by telephone and verified that I am speaking with the correct person using two identifiers.  Location of Patient: Private Residence   Location of Provider: [place] and [place] participating in Telemedicine visit: Geryl Rankins FNP-BC Latoya Juarez    History of Present Illness: Telemedicine visit for: RLQ mass  [name] was noted by me to have a palpable mass in the RLQ on  physical exam 08-02-2021. [place] of abdomen revealed Solid heterogeneous mass with internal vascularity abutting the superior aspect of the uterine fundus on the right and inferior to the liver, possible pedunculated fibroid. At this time CT has been ordered as she does endorse pain and discomfort in the area. She also has a history of anemia. If CT reveals fibroid she will need to be evaluated by gynecology.  I discussed the plan in detail with her today as well as imaging findings.     Past Medical History:  Diagnosis Date   Anemia    Fibroid    Hypertension    Obesity    Prediabetes     Past Surgical History:  Procedure Laterality Date   CESAREAN SECTION     TUBAL LIGATION      Family History  Problem Relation Age of Onset   Breast cancer Maternal Aunt    Diabetes Mother    Hypertension Mother    Stroke Mother    Hypertension Brother    Diabetes Brother    Stroke Brother      Social History   Socioeconomic History   Marital status: Legally Separated    Spouse name: Not on file   Number of children: Not on file   Years of education: Not on file   Highest education level: Not on file  Occupational History   Not on file  Tobacco Use   Smoking status: Every Day    Packs/day: 0.00    Types: Cigarettes   Smokeless tobacco: Never  Vaping Use   Vaping Use: Never used  Substance and Sexual Activity   Alcohol use: Yes    Comment: socially   Drug use: No   Sexual activity: Yes    Birth control/protection: Surgical, Condom  Other Topics Concern   Not on file  Social History Narrative   Not on file   Social Determinants of Health   Financial Resource Strain: Not on file  Food Insecurity: Not on file  Transportation Needs: Not on file  Physical Activity: Not on file  Stress: Not on file  Social Connections: Not on file     Observations/Objective: Awake, alert and oriented x 3   Review of Systems  Constitutional:  Negative for fever, malaise/fatigue and weight loss.  HENT: Negative.  Negative for nosebleeds.   Eyes: Negative.  Negative for blurred vision, double vision and photophobia.  Respiratory: Negative.  Negative for cough and shortness of breath.  Cardiovascular: Negative.  Negative for chest pain, palpitations and leg swelling.  Gastrointestinal:  Positive for abdominal pain. Negative for blood in stool, constipation, diarrhea, heartburn, melena, nausea and vomiting.  Musculoskeletal: Negative.  Negative for myalgias.  Neurological: Negative.  Negative for dizziness, focal weakness, seizures and headaches.  Psychiatric/Behavioral: Negative.  Negative for suicidal ideas.    Assessment and Plan: Diagnoses and all orders for this visit:  RLQ abdominal mass -     CT Abdomen Pelvis W Contrast; Future    Follow Up Instructions Return if symptoms worsen or fail to improve.     I discussed the assessment and treatment plan with the  patient. The patient was provided an opportunity to ask questions and all were answered. The patient agreed with the plan and demonstrated an understanding of the instructions.   The patient was advised to call back or seek an in-person evaluation if the symptoms worsen or if the condition fails to improve as anticipated.  I provided 10 minutes of non-face-to-face time during this encounter including median intraservice time, reviewing previous notes, labs, imaging, medications and explaining diagnosis and management.  [name], FNP-BC

## 2021-09-13 ENCOUNTER — Encounter (HOSPITAL_COMMUNITY): Payer: Self-pay

## 2021-09-13 ENCOUNTER — Ambulatory Visit (HOSPITAL_COMMUNITY)
Admission: RE | Admit: 2021-09-13 | Discharge: 2021-09-13 | Disposition: A | Discharge status: Home / Self Care | Payer: Self-pay | Source: Ambulatory Visit | Attending: Nurse Practitioner | Admitting: Nurse Practitioner

## 2021-09-13 ENCOUNTER — Other Ambulatory Visit: Payer: Self-pay

## 2021-09-13 DIAGNOSIS — R1903 Right lower quadrant abdominal swelling, mass and lump: Secondary | ICD-10-CM

## 2021-09-17 ENCOUNTER — Encounter: Payer: Self-pay | Admitting: Gastroenterology

## 2021-09-23 ENCOUNTER — Other Ambulatory Visit: Payer: Self-pay

## 2021-09-24 ENCOUNTER — Other Ambulatory Visit: Payer: Self-pay

## 2021-09-24 ENCOUNTER — Encounter (HOSPITAL_COMMUNITY): Payer: Self-pay

## 2021-09-24 ENCOUNTER — Ambulatory Visit (HOSPITAL_COMMUNITY)
Admission: RE | Admit: 2021-09-24 | Discharge: 2021-09-24 | Disposition: A | Discharge status: Home / Self Care | Payer: Self-pay | Source: Ambulatory Visit | Attending: Nurse Practitioner | Admitting: Nurse Practitioner

## 2021-09-24 DIAGNOSIS — R1903 Right lower quadrant abdominal swelling, mass and lump: Secondary | ICD-10-CM | POA: Insufficient documentation

## 2021-09-24 LAB — POCT I-STAT CREATININE: Creatinine, Ser: 0.9 mg/dL (ref 0.44–1.00)

## 2021-09-24 MED ORDER — IOHEXOL 350 MG/ML SOLN
75.0000 mL | Freq: Once | INTRAVENOUS | Status: AC | PRN
  Administered 2021-09-24: 75 mL via INTRAVENOUS

## 2021-09-24 MED ORDER — SODIUM CHLORIDE (PF) 0.9 % IJ SOLN
INTRAMUSCULAR | Status: AC
  Filled 2021-09-24: qty 50

## 2021-09-28 ENCOUNTER — Other Ambulatory Visit: Payer: Self-pay | Admitting: Nurse Practitioner

## 2021-09-28 DIAGNOSIS — D251 Intramural leiomyoma of uterus: Secondary | ICD-10-CM

## 2021-09-28 DIAGNOSIS — D250 Submucous leiomyoma of uterus: Secondary | ICD-10-CM

## 2021-09-30 ENCOUNTER — Other Ambulatory Visit: Payer: Self-pay

## 2021-10-03 ENCOUNTER — Other Ambulatory Visit: Payer: Self-pay

## 2021-11-08 ENCOUNTER — Other Ambulatory Visit: Payer: Self-pay

## 2021-11-08 ENCOUNTER — Ambulatory Visit (AMBULATORY_SURGERY_CENTER): Payer: Self-pay

## 2021-11-08 VITALS — Ht 61.0 in | Wt 214.0 lb

## 2021-11-08 DIAGNOSIS — Z1211 Encounter for screening for malignant neoplasm of colon: Secondary | ICD-10-CM

## 2021-11-08 MED ORDER — NA SULFATE-K SULFATE-MG SULF 17.5-3.13-1.6 GM/177ML PO SOLN
1.0000 | Freq: Once | ORAL | 0 refills | Status: AC
  Filled 2021-11-08 – 2021-11-19 (×2): qty 354, 1d supply, fill #0

## 2021-11-08 NOTE — Progress Notes (Signed)
Denies allergies to eggs or soy products. Denies complication of anesthesia or sedation. Denies use of weight loss medication. Denies use of O2.   Emmi instructions given for colonoscopy.  

## 2021-11-11 ENCOUNTER — Other Ambulatory Visit: Payer: Self-pay

## 2021-11-15 ENCOUNTER — Other Ambulatory Visit: Payer: Self-pay

## 2021-11-19 ENCOUNTER — Other Ambulatory Visit: Payer: Self-pay

## 2021-11-19 ENCOUNTER — Other Ambulatory Visit (HOSPITAL_COMMUNITY): Payer: Self-pay

## 2021-11-19 ENCOUNTER — Encounter: Payer: Self-pay | Admitting: Gastroenterology

## 2021-11-20 ENCOUNTER — Other Ambulatory Visit: Payer: Self-pay

## 2021-11-22 ENCOUNTER — Ambulatory Visit (AMBULATORY_SURGERY_CENTER): Payer: Self-pay | Admitting: Gastroenterology

## 2021-11-22 ENCOUNTER — Encounter: Payer: Self-pay | Admitting: Gastroenterology

## 2021-11-22 ENCOUNTER — Other Ambulatory Visit: Payer: Self-pay

## 2021-11-22 VITALS — BP 164/75 | HR 65 | Temp 98.2°F | Resp 13 | Ht 61.0 in | Wt 215.0 lb

## 2021-11-22 DIAGNOSIS — Z1211 Encounter for screening for malignant neoplasm of colon: Secondary | ICD-10-CM

## 2021-11-22 HISTORY — PX: COLONOSCOPY: SHX174

## 2021-11-22 HISTORY — PX: COLONOSCOPY WITH PROPOFOL: SHX5780

## 2021-11-22 MED ORDER — SODIUM CHLORIDE 0.9 % IV SOLN: 500.0000 mL | Freq: Once | INTRAVENOUS | Status: DC

## 2021-11-22 NOTE — Progress Notes (Signed)
Pt's states no medical or surgical changes since previsit or office visit.  Vitals DT 

## 2021-11-22 NOTE — Op Note (Addendum)
[place] Patient Name: Latoya Juarez Procedure Date: 11/22/2021 8:57 AM MRN: [id number] Endoscopist: Mauri Pole , MD Age: 51 Referring MD:  Date of Birth: [birth year] Gender: Female Account #: [id number] Procedure:                Colonoscopy Indications:              Screening for colorectal malignant neoplasm Medicines:                Monitored Anesthesia Care Procedure:                Pre-Anesthesia Assessment:                           - Prior to the procedure, a History and Physical                            was performed, and patient medications and                            allergies were reviewed. The patient's tolerance of                            previous anesthesia was also reviewed. The risks                            and benefits of the procedure and the sedation                            options and risks were discussed with the patient.                            All questions were answered, and informed consent                            was obtained. Prior Anticoagulants: The patient has                            taken no previous anticoagulant or antiplatelet                            agents. ASA Grade Assessment: III - A patient with                            severe systemic disease. After reviewing the risks                            and benefits, the patient was deemed in                            satisfactory condition to undergo the procedure.                           After obtaining informed consent, the colonoscope  was passed under direct vision. Throughout the                            procedure, the patient's blood pressure, pulse, and                            oxygen saturations were monitored continuously. The                            Olympus PCF-H190DL (#[id number]) Colonoscope was                            introduced through the anus and advanced to the the                            cecum,  identified by appendiceal orifice and                            ileocecal valve. The colonoscopy was performed                            without difficulty. The patient tolerated the                            procedure well. The quality of the bowel                            preparation was excellent. The ileocecal valve,                            appendiceal orifice, and rectum were photographed. Scope In: 9:16:04 AM Scope Out: 9:31:03 AM Scope Withdrawal Time: 0 hours 11 minutes 42 seconds  Total Procedure Duration: 0 hours 14 minutes 59 seconds  Findings:                 The perianal and digital rectal examinations were                            normal.                           Non-bleeding internal hemorrhoids were found during                            retroflexion. The hemorrhoids were medium-sized.                           The exam was otherwise without abnormality. Complications:            No immediate complications. Estimated Blood Loss:     Estimated blood loss was minimal. Impression:               - Non-bleeding internal hemorrhoids.                           - The examination was otherwise normal.                           -  No specimens collected. Recommendation:           - Patient has a contact number available for                            emergencies. The signs and symptoms of potential                            delayed complications were discussed with the                            patient. Return to normal activities tomorrow.                            Written discharge instructions were provided to the                            patient.                           - Resume previous diet.                           - Continue present medications.                           - Repeat colonoscopy in 10 years for screening                            purposes.                           - Return to GI clinic PRN. Mauri Pole, MD 11/22/2021 9:36:16  AM This report has been signed electronically.

## 2021-11-22 NOTE — Progress Notes (Signed)
[facility] History and Physical   Primary Care Physician:  [name], NP   Reason for Procedure:  Colorectal cancer screening  Plan:    Screening colonoscopy with possible interventions as needed     HPI: Latoya Juarez is a very pleasant 51 y.o. female here for screening colonoscopy. Denies any nausea, vomiting, abdominal pain, melena or bright red blood per rectum  The risks and benefits as well as alternatives of endoscopic procedure(s) have been discussed and reviewed. All questions answered. The patient agrees to proceed.    Past Medical History:  Diagnosis Date   Anemia    Fibroid    Hypertension    Obesity    Prediabetes     Past Surgical History:  Procedure Laterality Date   CESAREAN SECTION     COLONOSCOPY  11/22/2021   TUBAL LIGATION      Prior to Admission medications   Medication Sig Start Date End Date Taking? Authorizing Provider  lisinopril-hydrochlorothiazide (ZESTORETIC) 10-12.5 MG tablet Take 1 tablet by mouth daily. 08/07/21  Yes [name], NP  pravastatin (PRAVACHOL) 40 MG tablet Take 1 tablet (40 mg total) by mouth daily. 08/07/21  Yes [name], NP  Vitamin D, Ergocalciferol, (DRISDOL) 1.25 MG (50000 UNIT) CAPS capsule Take 1 capsule (50,000 Units total) by mouth every 7 (seven) days. 08/07/21  Yes [name], NP  metFORMIN (GLUCOPHAGE) 500 MG tablet Take 1 tablet (500 mg total) by mouth daily with breakfast. 08/07/21   [name], NP    Current Outpatient Medications  Medication Sig Dispense Refill   lisinopril-hydrochlorothiazide (ZESTORETIC) 10-12.5 MG tablet Take 1 tablet by mouth daily. 90 tablet 1   pravastatin (PRAVACHOL) 40 MG tablet Take 1 tablet (40 mg total) by mouth daily. 90 tablet 1   Vitamin D, Ergocalciferol, (DRISDOL) 1.25 MG (50000 UNIT) CAPS capsule Take 1 capsule (50,000 Units total) by mouth every 7 (seven) days. 12 capsule 1   metFORMIN (GLUCOPHAGE) 500 MG tablet Take 1 tablet  (500 mg total) by mouth daily with breakfast. 90 tablet 1   Current Facility-Administered Medications  Medication Dose Route Frequency Provider Last Rate Last Admin   0.9 %  sodium chloride infusion  500 mL Intravenous Once Mauri Pole, MD        Allergies as of 11/22/2021 - Review Complete 11/22/2021  Allergen Reaction Noted   Hydroxyzine Other (See Comments) 05/03/2019    Family History  Problem Relation Age of Onset   Diabetes Mother    Hypertension Mother    Stroke Mother    Hypertension Brother    Diabetes Brother    Stroke Brother    Breast cancer Maternal Aunt    Colon cancer Neg Hx    Esophageal cancer Neg Hx    Rectal cancer Neg Hx    Stomach cancer Neg Hx     Social History   Socioeconomic History   Marital status: Legally Separated    Spouse name: Not on file   Number of children: Not on file   Years of education: Not on file   Highest education level: Not on file  Occupational History   Not on file  Tobacco Use   Smoking status: Every Day    Packs/day: 0.00    Types: Cigarettes   Smokeless tobacco: Never   Tobacco comments:    11-22-21- 7am  Vaping Use   Vaping Use: Never used  Substance and Sexual Activity   Alcohol use: Yes  Comment: socially   Drug use: No   Sexual activity: Yes    Birth control/protection: Surgical, Condom  Other Topics Concern   Not on file  Social History Narrative   Not on file   Social Determinants of Health   Financial Resource Strain: Not on file  Food Insecurity: Not on file  Transportation Needs: Not on file  Physical Activity: Not on file  Stress: Not on file  Social Connections: Not on file  Intimate Partner Violence: Not on file    Review of Systems:  All other review of systems negative except as mentioned in the HPI.  Physical Exam: Vital signs in last 24 hours: BP (!) 144/88    Pulse 76    Temp 98.2 F (36.8 C) (Temporal)    Ht 5\' 1"  (1.549 m)    Wt 215 lb (97.5 kg)    LMP 11/16/2021     SpO2 97%    BMI 40.62 kg/m  General:   Alert, NAD Lungs:  Clear .   Heart:  Regular rate and rhythm Abdomen:  Soft, nontender and nondistended. Neuro/Psych:  Alert and cooperative. Normal mood and affect. A and O x 3  Reviewed labs, radiology imaging, old records and pertinent past GI work up  Patient is appropriate for planned procedure(s) and anesthesia in an ambulatory setting   K. Denzil Magnuson , MD [phone]

## 2021-11-22 NOTE — Patient Instructions (Signed)
Handout on hemorrhoids provided   Repeat colonoscopy in 10 years for screening   YOU HAD AN ENDOSCOPIC PROCEDURE TODAY AT [place]:   Refer to the procedure report that was given to you for any specific questions about what was found during the examination.  If the procedure report does not answer your questions, please call your gastroenterologist to clarify.  If you requested that your care partner not be given the details of your procedure findings, then the procedure report has been included in a sealed envelope for you to review at your convenience later.  YOU SHOULD EXPECT: Some feelings of bloating in the abdomen. Passage of more gas than usual.  Walking can help get rid of the air that was put into your GI tract during the procedure and reduce the bloating. If you had a lower endoscopy (such as a colonoscopy or flexible sigmoidoscopy) you may notice spotting of blood in your stool or on the toilet paper. If you underwent a bowel prep for your procedure, you may not have a normal bowel movement for a few days.  Please Note:  You might notice some irritation and congestion in your nose or some drainage.  This is from the oxygen used during your procedure.  There is no need for concern and it should clear up in a day or so.  SYMPTOMS TO REPORT IMMEDIATELY:  Following lower endoscopy (colonoscopy or flexible sigmoidoscopy):  Excessive amounts of blood in the stool  Significant tenderness or worsening of abdominal pains  Swelling of the abdomen that is new, acute  Fever of 100F or higher  For urgent or emergent issues, a gastroenterologist can be reached at any hour by calling [phone]. Do not use MyChart messaging for urgent concerns.    DIET:  We do recommend a small meal at first, but then you may proceed to your regular diet.  Drink plenty of fluids but you should avoid alcoholic beverages for 24 hours.  ACTIVITY:  You should plan to take it easy for the rest  of today and you should NOT DRIVE or use heavy machinery until tomorrow (because of the sedation medicines used during the test).    FOLLOW UP: Our staff will call the number listed on your records 48-72 hours following your procedure to check on you and address any questions or concerns that you may have regarding the information given to you following your procedure. If we do not reach you, we will leave a message.  We will attempt to reach you two times.  During this call, we will ask if you have developed any symptoms of COVID 19. If you develop any symptoms (ie: fever, flu-like symptoms, shortness of breath, cough etc.) before then, please call [phone].  If you test positive for Covid 19 in the 2 weeks post procedure, please call and report this information to [place].    If any biopsies were taken you will be contacted by phone or by letter within the next 1-3 weeks.  Please call us at [phone] if you have not heard about the biopsies in 3 weeks.    SIGNATURES/CONFIDENTIALITY: You and/or your care partner have signed paperwork which will be entered into your electronic medical record.  These signatures attest to the fact that that the information above on your After Visit Summary has been reviewed and is understood.  Full responsibility of the confidentiality of this discharge information lies with you and/or your care-partner.

## 2021-11-22 NOTE — Progress Notes (Signed)
To pacu, VSS. Report to [email or web address] 

## 2021-11-26 ENCOUNTER — Telehealth: Payer: Self-pay

## 2021-11-26 NOTE — Telephone Encounter (Signed)
°  Follow up Call-  Call back number 11/22/2021  Post procedure Call Back phone  # [phone]  Permission to leave phone message Yes  Some recent data might be hidden     Patient questions:  Do you have a fever, pain , or abdominal swelling? No. Pain Score  0 *  Have you tolerated food without any problems? Yes.    Have you been able to return to your normal activities? Yes.    Do you have any questions about your discharge instructions: Diet   No. Medications  No. Follow up visit  No.  Do you have questions or concerns about your Care? No.  Actions: * If pain score is 4 or above: No action needed, pain <4.

## 2021-12-17 ENCOUNTER — Other Ambulatory Visit: Payer: Self-pay

## 2021-12-31 ENCOUNTER — Other Ambulatory Visit: Payer: Self-pay

## 2022-01-07 ENCOUNTER — Other Ambulatory Visit: Payer: Self-pay

## 2022-01-10 ENCOUNTER — Other Ambulatory Visit: Payer: Self-pay

## 2022-02-06 ENCOUNTER — Other Ambulatory Visit: Payer: Self-pay

## 2022-02-07 ENCOUNTER — Other Ambulatory Visit: Payer: Self-pay

## 2022-02-11 ENCOUNTER — Other Ambulatory Visit: Payer: Self-pay

## 2022-03-19 ENCOUNTER — Other Ambulatory Visit: Payer: Self-pay

## 2022-03-31 ENCOUNTER — Ambulatory Visit: Payer: Self-pay

## 2022-03-31 ENCOUNTER — Ambulatory Visit (HOSPITAL_COMMUNITY)
Admission: EM | Admit: 2022-03-31 | Discharge: 2022-03-31 | Disposition: A | Discharge status: Home / Self Care | Payer: 59 | Attending: Psychiatry | Admitting: Psychiatry

## 2022-03-31 DIAGNOSIS — F321 Major depressive disorder, single episode, moderate: Secondary | ICD-10-CM

## 2022-03-31 MED ORDER — HYDROXYZINE HCL 10 MG PO TABS
10.0000 mg | ORAL_TABLET | Freq: Three times a day (TID) | ORAL | 0 refills | Status: DC | PRN
  Filled 2022-03-31: qty 60, 20d supply, fill #0

## 2022-03-31 NOTE — Telephone Encounter (Signed)
     Chief Complaint: Anxiety since her mother's death 2 years ago Symptoms: Anxiety, can't go to work Frequency: 2018 Pertinent Negatives: Patient denies self-harm Disposition: '[]'$ ED /'[x]'$ Urgent Care (no appt availability in office) / '[]'$ Appointment(In office/virtual)/ '[]'$  [facility]/ '[]'$ Home Care/ '[]'$ Refused Recommended Disposition /'[]'$ [place] [facility]/ '[]'$  Follow-up with PCP Additional Notes: Going to [place]  Reason for Disposition  Symptoms interfere with work or school  Answer Assessment - Initial Assessment Questions 1. CONCERN: "Did anything happen that prompted you to call today?"      Anxiety 2. ANXIETY SYMPTOMS: "Can you describe how you (your loved one; patient) have been feeling?" (e.g., tense, restless, panicky, anxious, keyed up, overwhelmed, sense of impending doom).      Very anxious 3. ONSET: "How long have you been feeling this way?" (e.g., hours, days, weeks)     2018 4. SEVERITY: "How would you rate the level of anxiety?" (e.g., 0 - 10; or mild, moderate, severe).     Severe 5. FUNCTIONAL IMPAIRMENT: "How have these feelings affected your ability to do daily activities?" "Have you had more difficulty than usual doing your normal daily activities?" (e.g., getting better, same, worse; self-care, school, work, Tree surgeon)     Calling out work 6. HISTORY: "Have you felt this way before?" "Have you ever been diagnosed with an anxiety problem in the past?" (e.g., generalized anxiety disorder, panic attacks, PTSD). If Yes, ask: "How was this problem treated?" (e.g., medicines, counseling, etc.)     Yes 7. RISK OF HARM - SUICIDAL IDEATION: "Do you ever have thoughts of hurting or killing yourself?" If Yes, ask:  "Do you have these feelings now?" "Do you have a plan on how you would do this?"     No 8. TREATMENT:  "What has been done so far to treat this anxiety?" (e.g., medicines, relaxation strategies). "What has helped?"     Has had medication  before 9. TREATMENT - THERAPIST: "Do you have a counselor or therapist? Name?"     No 10. POTENTIAL TRIGGERS: "Do you drink caffeinated beverages (e.g., coffee, colas, teas), and how much daily?" "Do you drink alcohol or use any drugs?" "Have you started any new medicines recently?"     Drinks alcohol 10. PATIENT SUPPORT: "Who is with you now?" "Who do you live with?" "Do you have family or friends who you can talk to?"        Has children 25. OTHER SYMPTOMS: "Do you have any other symptoms?" (e.g., feeling depressed, trouble concentrating, trouble sleeping, trouble breathing, palpitations or fast heartbeat, chest pain, sweating, nausea, or diarrhea)       Depressed 12. PREGNANCY: "Is there any chance you are pregnant?" "When was your last menstrual period?"       No  Protocols used: Anxiety and Panic Attack-A-AH

## 2022-03-31 NOTE — Discharge Instructions (Addendum)
Take on 10 mg tablet of hydroxyzine, if not effective dose can be doubled to 20 mg tablet.  Can take up to 20 mg three times daily if needed.   Patient is instructed prior to discharge to: Take all medications as prescribed by his/her mental healthcare provider. Report any adverse effects and or reactions from the medicines to his/her outpatient provider promptly. Patient has been instructed & cautioned: To not engage in alcohol and or illegal drug use while on prescription medicines. In the event of worsening symptoms, patient is instructed to call the crisis hotline, 911 and or go to the nearest ED for appropriate evaluation and treatment of symptoms. To follow-up with his/her primary care provider for your other medical issues, concerns and or health care needs.   Patient is instructed prior to discharge to: Take all medications as prescribed by his/her mental healthcare provider. Report any adverse effects and or reactions from the medicines to his/her outpatient provider promptly. Patient has been instructed & cautioned: To not engage in alcohol and or illegal drug use while on prescription medicines. In the event of worsening symptoms, patient is instructed to call the crisis hotline, 911 and or go to the nearest ED for appropriate evaluation and treatment of symptoms. To follow-up with his/her primary care provider for your other medical issues, concerns and or health care needs.  Please contact one of the following facilities to start medication management and therapy services:   [facility] [address]  [place], [place] [address] [phone]   [facility]  [address] [place], [place] [address] [phone]  [facility]  [address] [place], [place] [address] [phone]  [facility]  [address]  [place], [place] [address] [phone]  [facility]  [place], [place] [address] [phone]  [facility] [place]  [address],  [place], [place] [address] [phone]

## 2022-03-31 NOTE — ED Triage Notes (Signed)
Pt presents to [facility] seeking medication for anxiety. Pt states that she has been grieving the loss of her brother and mom from 2020. Pt tearful. Pt states that she has been stressed dealing with her children. Pt reports having a good support system, her boyfriend. Pt state she left the house for 3 days last week and her family was worried about her. Pt states she went to [place] to be with her step sister. Pt states she was impulsive. Pt denies SI/HI and AVH.

## 2022-03-31 NOTE — ED Provider Notes (Signed)
[facility] Medical Screening Exam  Patient Name: Latoya Juarez MRN: [id number] Date of Evaluation: 03/31/22 Chief Complaint:   Diagnosis:  Final diagnoses:  Current moderate episode of major depressive disorder, unspecified whether recurrent ([place])    History of Present illness: Latoya Juarez is a 51 y.o. female patient presented to [facility] as a walk in with her boyfriend with complaints of increased depression and anxiety  Latoya Juarez, 51 y.o., female patient seen face to face by this provider, consulted with Dr. Serafina Mitchell; and chart reviewed on 03/31/22.  Patient reports that she has been diagnosed with depression and anxiety in the past.  She has no psychiatric services in place.  She does not take any medications.  On evaluation Latoya Juarez reports she lost her mother and brother years ago but she has been unable to get over the loss.  States it is beginning to interfere in her personal life and with her employment.  She has noticed since October of last year her depression and anxiety have increased.  Last week she left her home for 3 days and went to visit her stepsister in [place].  Reports this was a last-minute decision and she did not tell her children.  During that time her children became very concerned about her.  Patient is requesting medications for anxiety.  During evaluation Latoya Juarez is in a sitting position in no acute distress.  She is seated in the assessment room and is well-groomed.  She has sunglasses on during the assessment and does appear to be tearful at times.  She is alert/oriented x4 and cooperative.  She has normal speech and behavior.  She endorses increased anxiety with feelings of helplessness, decreased energy, decreased focus, decreased appetite and sleep.  She denies SI/HI/AVH.  She contracts for safety and denies any access to firearms/weapons.  Objectively she does not appear to be responding to internal/external stimuli.  She is  able to answer questions appropriately and appears logical.  She denies any substance use.  Discussed PHP program with patient and she declined.  Discussed providing resources for outpatient medication management and therapy agreed.  Discussed hydroxyzine 10 mg 3 times daily as needed for anxiety.  She has tried hydroxyzine in the past and states that it gave her nightmares however initially she is willing to try medication again.  After medication was sent to patient's pharmacy she decided she did not want to try the medication.  At this time Latoya Juarez is educated and verbalizes understanding of mental health resources and other crisis services in the community.  She is instructed to call 911 and present to the nearest emergency room should she experience any suicidal/homicidal ideation, auditory/visual/hallucinations, or detrimental worsening of her mental health condition.  She was a also advised by Probation officer that she could call the toll-free phone on insurance card to assist with identifying in network counselors and agencies or number on back of Medicaid card t speak with care coordinator    Psychiatric Specialty Exam  Presentation  General Appearance:Appropriate for Environment; Casual  Eye Contact:Minimal  Speech:Clear and Coherent; Normal Rate  Speech Volume:Normal  Handedness:Right   Mood and Affect  Mood:Anxious; Depressed Affect:Congruent  Thought Process  Thought Processes:Coherent Descriptions of Associations:Intact  Orientation:Full (Time, Place and Person)  Thought Content:Logical    Hallucinations:None  Ideas of Reference:None  Suicidal Thoughts:No  Homicidal Thoughts:No   Sensorium  Memory:Immediate Good; Recent Good; Remote Good Judgment:Good Insight:Good  Community education officer  Concentration:Good Attention Span:Good Recall:Good Fund of Knowledge:Good Language:Good  Psychomotor Activity  Psychomotor Activity:Normal  Assets   Assets:Communication Skills; Desire for Improvement; Financial Resources/Insurance; Housing; Physical Health; Resilience; Vocational/Educational; Transportation  Sleep  Sleep:Fair Number of hours: No data recorded  No data recorded  Physical Exam: Physical Exam Vitals and nursing note reviewed.  Constitutional:      General: She is not in acute distress.    Appearance: Normal appearance. She is not ill-appearing.  HENT:     Head: Normocephalic.  Eyes:     General:        Right eye: No discharge.        Left eye: No discharge.     Conjunctiva/sclera: Conjunctivae normal.  Cardiovascular:     Rate and Rhythm: Normal rate.  Pulmonary:     Effort: Pulmonary effort is normal.  Musculoskeletal:        General: Normal range of motion.     Cervical back: Normal range of motion.  Skin:    Coloration: Skin is not jaundiced or pale.  Neurological:     Mental Status: She is alert and oriented to person, place, and time.  Psychiatric:        Attention and Perception: Attention and perception normal.        Mood and Affect: Affect normal. Mood is anxious and depressed.        Speech: Speech normal.        Behavior: Behavior normal. Behavior is cooperative.        Thought Content: Thought content normal.        Cognition and Memory: Cognition normal.        Judgment: Judgment normal.    Review of Systems  Constitutional: Negative.   HENT: Negative.    Eyes: Negative.   Respiratory: Negative.    Cardiovascular: Negative.   Musculoskeletal: Negative.   Skin: Negative.   Neurological: Negative.   Psychiatric/Behavioral:  Positive for depression. The patient is nervous/anxious.    Blood pressure (!) 142/91, pulse 86, temperature 98.2 F (36.8 C), temperature source Oral, resp. rate 18, SpO2 98 %. There is no height or weight on file to calculate BMI.  Musculoskeletal: Strength & Muscle Tone: within normal limits Gait & Station: normal Patient leans: N/A   [place] MSE  Discharge Disposition for Follow up and Recommendations: Based on my evaluation the patient does not appear to have an emergency medical condition and can be discharged with resources and follow up care in outpatient services for Medication Management, Partial Hospitalization Program, and Individual Therapy  Discharge patient  Provided prescription for 10 mg hydroxyzine 3 times daily as needed for anxiety which was sent to patient's pharmacy.  Discussed speech.  Patient declined.  Outpatient psychiatric resources for therapy and medication management provided  No evidence of imminent risk to self or others at present.    Patient does not meet criteria for psychiatric inpatient admission. Discussed crisis plan, support from social network, calling 911, coming to the Emergency Department, and calling Suicide Hotline.    Revonda Humphrey, NP 03/31/2022, 9:47 PM

## 2022-04-01 ENCOUNTER — Other Ambulatory Visit: Payer: Self-pay

## 2022-04-02 ENCOUNTER — Telehealth: Payer: Self-pay | Admitting: Pharmacist

## 2022-04-02 NOTE — Telephone Encounter (Signed)
Patient attempted to be outreached by Tanja Port, PharmD Candidate on 04/02/22 to discuss hypertension. Voicemail was full.    Catie Hedwig Morton, PharmD, [facility] [phone]

## 2022-04-30 ENCOUNTER — Ambulatory Visit: Payer: Managed Care, Other (non HMO) | Attending: Physician Assistant | Admitting: Physician Assistant

## 2022-04-30 ENCOUNTER — Encounter: Payer: Self-pay | Admitting: Physician Assistant

## 2022-04-30 ENCOUNTER — Other Ambulatory Visit: Payer: Self-pay

## 2022-04-30 VITALS — BP 140/82 | HR 80 | Wt 215.4 lb

## 2022-04-30 DIAGNOSIS — D509 Iron deficiency anemia, unspecified: Secondary | ICD-10-CM

## 2022-04-30 DIAGNOSIS — R7303 Prediabetes: Secondary | ICD-10-CM

## 2022-04-30 DIAGNOSIS — I10 Essential (primary) hypertension: Secondary | ICD-10-CM

## 2022-04-30 DIAGNOSIS — F4321 Adjustment disorder with depressed mood: Secondary | ICD-10-CM

## 2022-04-30 DIAGNOSIS — E559 Vitamin D deficiency, unspecified: Secondary | ICD-10-CM

## 2022-04-30 DIAGNOSIS — F32A Depression, unspecified: Secondary | ICD-10-CM

## 2022-04-30 DIAGNOSIS — E782 Mixed hyperlipidemia: Secondary | ICD-10-CM

## 2022-04-30 DIAGNOSIS — H6502 Acute serous otitis media, left ear: Secondary | ICD-10-CM

## 2022-04-30 DIAGNOSIS — F419 Anxiety disorder, unspecified: Secondary | ICD-10-CM

## 2022-04-30 MED ORDER — ESCITALOPRAM OXALATE 20 MG PO TABS
20.0000 mg | ORAL_TABLET | Freq: Every day | ORAL | 1 refills | Status: DC
  Filled 2022-04-30: qty 30, 30d supply, fill #0

## 2022-04-30 MED ORDER — LISINOPRIL-HYDROCHLOROTHIAZIDE 10-12.5 MG PO TABS
1.0000 | ORAL_TABLET | Freq: Every day | ORAL | 1 refills | Status: DC
  Filled 2022-04-30: qty 30, 30d supply, fill #0
  Filled 2022-06-18: qty 30, 30d supply, fill #1
  Filled 2022-08-06: qty 30, 30d supply, fill #2
  Filled 2022-09-23: qty 30, 30d supply, fill #3
  Filled 2022-11-04: qty 30, 30d supply, fill #4
  Filled 2022-12-09 (×2): qty 30, 30d supply, fill #5

## 2022-04-30 MED ORDER — PRAVASTATIN SODIUM 40 MG PO TABS
40.0000 mg | ORAL_TABLET | Freq: Every day | ORAL | 1 refills | Status: DC
  Filled 2022-04-30: qty 30, 30d supply, fill #0
  Filled 2022-06-18: qty 90, 90d supply, fill #1
  Filled 2022-08-06 – 2022-09-23 (×2): qty 60, 60d supply, fill #2

## 2022-04-30 MED ORDER — METFORMIN HCL 500 MG PO TABS
500.0000 mg | ORAL_TABLET | Freq: Every day | ORAL | 1 refills | Status: DC
  Filled 2022-04-30: qty 30, 30d supply, fill #0
  Filled 2022-06-18: qty 90, 90d supply, fill #1
  Filled 2022-08-06 – 2022-09-23 (×2): qty 60, 60d supply, fill #2

## 2022-04-30 MED ORDER — FLUTICASONE PROPIONATE 50 MCG/ACT NA SUSP
2.0000 | Freq: Every day | NASAL | 6 refills | Status: DC
  Filled 2022-04-30: qty 16, 30d supply, fill #0
  Filled 2022-06-18 – 2022-09-23 (×2): qty 16, 30d supply, fill #1
  Filled 2023-02-18: qty 16, 30d supply, fill #2

## 2022-04-30 MED ORDER — AMOXICILLIN 500 MG PO CAPS
500.0000 mg | ORAL_CAPSULE | Freq: Three times a day (TID) | ORAL | 0 refills | Status: AC
Start: 2022-04-30 — End: 2022-05-11
  Filled 2022-04-30: qty 30, 10d supply, fill #0

## 2022-04-30 NOTE — Patient Instructions (Signed)
[facility] [address], [place], [place] [address] [phone] or [phone] Walk-in urgent care 24/7 for anyone  For [facility] ONLY New patient assessments and therapy walk-ins: Monday and Wednesday 8am-11am First and second Friday 1pm-5pm New patient psychiatry and medication management walk-ins: Mondays, Wednesdays, Thursdays, Fridays 8am-11am No psychiatry walk-ins on Tuesday       Grief support at [place]     [facility] [address] [place], [place] [address] [phone] or [phone] Walk-in urgent care 24/7 for anyone  For [facility] ONLY New patient assessments and therapy walk-ins: Monday and Wednesday 8am-11am First and second Friday 1pm-5pm New patient psychiatry and medication management walk-ins: Mondays, Wednesdays, Thursdays, Fridays 8am-11am No psychiatry walk-ins on Tuesday   *Accepts all insurance and uninsured for Urgent Care needs *Accepts Medicaid and uninsured for outpatient treatment   [facility] (Therapy and psychiatry) [facility] (near [place]) [address] [place], [place] [address] [phone] Fax: [phone] ([place])   [facility] [place],  [place]  [address] [phone] Call for appointment  [facility] (Therapy only)  [facility] [place] [address] [place], [place] [address] Monday - Friday: 8:30 a.m.-12 p.m. / 1 p.m.-2:30 p.m.  [facility] [address] [place], [place] [address] Monday-Friday: 8:30 a.m.-12 p.m. / 2-3:30 p.m. (INSURANCE REQUIRED -MEDICAID ACCEPTED) They do offer a sliding fee scale $20-$30/session   [facility] [place], [place] [address] Phone: [place] [address] [place] [address]  Phone: [phone] (Does not accept Medicaid) (only one provider accepts Medicare) [facility] [address] (at [employer]) [place], [place] [address] (Accepts Medicaid and Medicare)  [facility]  [place] [address]  [place], [place] [address]  Phone: [phone]  [address] [place], [place] [address] Phone: [phone] [facility] Medicaid) [facility] (Therapy only)  [address] [place], [place] [address] Phone: [phone]   [facility] (Therapy only)  [place], [place] [address] Phone: [phone] [facility])   [facility] [address], [place], [place] [address] Phone: [phone] ([place]) [facility] [phone] [address] [place], [place] [address] Phone: [phone] [facility]) [facility] (Psychiatry only)  [phone] [address], [place], [place] [address] (Accepts Medicaid and Medicare) [facility] [place] (Psychiatry and therapy)  [place], [place], [place] [address] [phone] [facility] Medicare) [place] (psychiatry and therapy) [address], [place], [place] [address] [phone]  [facility] at [address], [place], [place], [place] [address] [phone] Accept [address], [place], [place], [place], [place],  and the following types of Medicaid; Alliance, [place], Partners, [place], [place], [employer], Healthy [place], [place], and [place], as well as offering a Manufacturing systems engineer and private payment options. Provides In-Office Appointments, Virtual Appointments, and Phone Consultations Offers medication management for ages 63 years old and up, including,  Medication Management for Suboxone and  [place] [phone] [address], [place],  [address] ([place] Medicaid and Medicare)  19.  [facility] (therapy only)  [address] [place], [place] [address]            [phone] ([place]) 20. [facility]  (Suboxone and methodone) [phone] [address], [place], [place] [address] To Be Eligible for Opioid Treatment at [facility] you must be at least 50 years of age you have already tried other interventions that were not successful such as opioid detox, inpatient rehab for opioids, or outpatient counseling specifically for opioid dependency your [facility] drug test must be completely free of benzodiazepines (klonopin, xanax, valium, ativan, or other benz) you have reliable transportation to the [facility] clinic in [place] you recognize that counseling is a critical component of [facility]' Opioid Program and you agree to attend all required counseling sessions you are committed to total drug abstinence and will conscientiously strive to remain free of alcohol, marijuana, and other illicit substances while in treatment you desire a peaceful treatment atmosphere in which personal responsibility and respect toward staff and clients is the norm   21. [facility] [place], [place], [place] [address] Offers SAIOP (Substance Abuse Intensive Outpatient Program) [phone] 22. [facility] [address] [place], [place] [address] [phone] (Accepts medicare)  For those who are tech savvy, go on psychology today, type in your local city (i.e. [place]. [place]) and specify your insurance at the top of the screen after you search. (Medicaid if needed). You can also specify whether you are interested in therapy and psychiatry.  [email or web address]/us

## 2022-04-30 NOTE — Progress Notes (Signed)
Patient ID: Latoya Juarez, female   DOB: 1970/11/28, 51 y.o.   MRN: [id number]   Latoya Juarez, is a 51 y.o. female  [id number]  [id number]  DOB - 03/20/1971  Chief Complaint  Patient presents with   Medication Refill       Subjective:   Latoya Juarez is a 50 y.o. female here today for a follow up visit RF. Patient has No headache, No chest pain, No abdominal pain - No Nausea, No new weakness tingling or numbness, No Cough - SOB.  She is complaining of R ear pressure and twinges of pain.  Onset:  1 weeks ago Location:  L ear and L face Duration:  all day/daily since started Characterization/quality:  dull/aching/pressure Alleviating/aggravating:  not tried anything Radiation: no Severity:  moderate  Also she would like to restart lexapro.  It helped in the past with anxiety and depression.  She is still having trouble with the death of her mom and brother.  She feels as though she never fully grieved those losses.  She is now open to counseling despite not wanting to do it in the past.  Denies SI/HI.  Has future plans/hope/faith/ family for support  Periods still regular and monthly but she is feeling as though she may be going through the change.    No problems updated.  ALLERGIES: Allergies  Allergen Reactions   Hydroxyzine Other (See Comments)    Nightmares    PAST MEDICAL HISTORY: Past Medical History:  Diagnosis Date   Anemia    Fibroid    Hypertension    Obesity    Prediabetes     MEDICATIONS AT HOME: Prior to Admission medications   Medication Sig Start Date End Date Taking? Authorizing Provider  amoxicillin (AMOXIL) 500 MG capsule Take 1 capsule (500 mg total) by mouth 3 (three) times daily for 10 days. 04/30/22 05/10/22 Yes Chadley Dziedzic, Dionne Bucy, PA-C  escitalopram (LEXAPRO) 20 MG tablet Take 1 tablet (20 mg total) by mouth daily. 04/30/22  Yes Naresh Althaus M, PA-C  fluticasone (FLONASE) 50 MCG/ACT nasal spray Place 2 sprays into both nostrils  daily. 04/30/22  Yes Kira Hartl, Dionne Bucy, PA-C  Vitamin D, Ergocalciferol, (DRISDOL) 1.25 MG (50000 UNIT) CAPS capsule Take 1 capsule (50,000 Units total) by mouth every 7 (seven) days. 08/07/21  Yes [name], NP  lisinopril-hydrochlorothiazide (ZESTORETIC) 10-12.5 MG tablet Take 1 tablet by mouth daily. 04/30/22   Argentina Donovan, PA-C  metFORMIN (GLUCOPHAGE) 500 MG tablet Take 1 tablet (500 mg total) by mouth daily with breakfast. 04/30/22   Argentina Donovan, PA-C  pravastatin (PRAVACHOL) 40 MG tablet Take 1 tablet (40 mg total) by mouth daily. 04/30/22   Torrin Frein, Dionne Bucy, PA-C    ROS: Neg HEENT Neg resp Neg cardiac Neg GI Neg GU Neg MS Neg psych Neg neuro  Objective:   Vitals:   04/30/22 1018  BP: (!) 153/107  Pulse: 80  SpO2: 96%  Weight: 215 lb 6.4 oz (97.7 kg)   Exam General appearance : Awake, alert, not in any distress. Speech Clear. Not toxic looking.  BP recheck manually 140/82-says BP OOO is <130/85 HEENT: Atraumatic and Normocephalic.  R TM WNL, L TM full and dull and bulging,  throat with PND.   Neck: Supple, no JVD. No cervical lymphadenopathy.  Chest: Good air entry bilaterally, CTAB.  No rales/rhonchi/wheezing CVS: S1 S2 regular, no murmurs.  Extremities: B/L Lower Ext shows no edema, both legs are warm to touch Neurology: Awake alert, and  oriented X 3, CN II-XII intact, Non focal Skin: No Rash  Data Review Lab Results  Component Value Date   HGBA1C 5.7 (H) 07/25/2021   HGBA1C 5.6 06/06/2020   HGBA1C 5.3 07/19/2019    Assessment & Plan   1. Anxiety and depression Take 1/2 tab for the first week to avoid serotonin syndrome.  Patient verbalizes understanding - escitalopram (LEXAPRO) 20 MG tablet; Take 1 tablet (20 mg total) by mouth daily.  Dispense: 90 tablet; Refill: 1 - Thyroid Panel With TSH - FSH/LH - Ambulatory referral to Psychology  2. Essential hypertension Controlled OOO - Comprehensive metabolic panel -  lisinopril-hydrochlorothiazide (ZESTORETIC) 10-12.5 MG tablet; Take 1 tablet by mouth daily.  Dispense: 90 tablet; Refill: 1  3. Vitamin D deficiency disease - Vitamin D, 25-hydroxy  4. Mixed hyperlipidemia - Comprehensive metabolic panel - Lipid panel - pravastatin (PRAVACHOL) 40 MG tablet; Take 1 tablet (40 mg total) by mouth daily.  Dispense: 90 tablet; Refill: 1  5. Iron deficiency anemia, unspecified iron deficiency anemia type - CBC with Differential/Platelet  6. Prediabetes - Comprehensive metabolic panel - metFORMIN (GLUCOPHAGE) 500 MG tablet; Take 1 tablet (500 mg total) by mouth daily with breakfast.  Dispense: 90 tablet; Refill: 1 - Hemoglobin A1c; Future  7. Acute serous otitis media of left ear, recurrence not specified - fluticasone (FLONASE) 50 MCG/ACT nasal spray; Place 2 sprays into both nostrils daily.  Dispense: 16 g; Refill: 6 - amoxicillin (AMOXIL) 500 MG capsule; Take 1 capsule (500 mg total) by mouth 3 (three) times daily for 10 days.  Dispense: 30 capsule; Refill: 0  8. Grief reaction Restarted lexapro - Ambulatory referral to Psychology    Return in about 3 months (around 07/31/2022) for with PCP for chronic conditons and recheck anxiety/depression restarted meds.  The patient was given clear instructions to go to ER or return to medical center if symptoms don't improve, worsen or new problems develop. The patient verbalized understanding. The patient was told to call to get lab results if they haven't heard anything in the next week.      Freeman Caldron, PA-C [facility] [place], [place]   04/30/2022, 10:50 AM

## 2022-05-01 ENCOUNTER — Other Ambulatory Visit: Payer: Self-pay

## 2022-05-01 ENCOUNTER — Other Ambulatory Visit: Payer: Self-pay | Admitting: Physician Assistant

## 2022-05-01 DIAGNOSIS — E559 Vitamin D deficiency, unspecified: Secondary | ICD-10-CM

## 2022-05-01 LAB — LIPID PANEL
Chol/HDL Ratio: 2.5 ratio (ref 0.0–4.4)
Cholesterol, Total: 211 mg/dL — ABNORMAL HIGH (ref 100–199)
HDL: 86 mg/dL (ref >39)
LDL Chol Calc (NIH): 109 mg/dL — ABNORMAL HIGH (ref 0–99)
Triglycerides: 91 mg/dL (ref 0–149)
VLDL Cholesterol Cal: 16 mg/dL (ref 5–40)

## 2022-05-01 LAB — CBC WITH DIFFERENTIAL/PLATELET
Basophils Absolute: 0.1 10*3/uL (ref 0.0–0.2)
Basos: 1 %
EOS (ABSOLUTE): 0.3 10*3/uL (ref 0.0–0.4)
Eos: 3 %
Hematocrit: 37.5 % (ref 34.0–46.6)
Hemoglobin: 12.7 g/dL (ref 11.1–15.9)
Immature Grans (Abs): 0 10*3/uL (ref 0.0–0.1)
Immature Granulocytes: 0 %
Lymphocytes Absolute: 1.6 10*3/uL (ref 0.7–3.1)
Lymphs: 17 %
MCH: 31.4 pg (ref 26.6–33.0)
MCHC: 33.9 g/dL (ref 31.5–35.7)
MCV: 93 fL (ref 79–97)
Monocytes Absolute: 0.6 10*3/uL (ref 0.1–0.9)
Monocytes: 6 %
Neutrophils Absolute: 6.9 10*3/uL (ref 1.4–7.0)
Neutrophils: 73 %
Platelets: 378 10*3/uL (ref 150–450)
RBC: 4.05 x10E6/uL (ref 3.77–5.28)
RDW: 11.9 % (ref 11.7–15.4)
WBC: 9.4 10*3/uL (ref 3.4–10.8)

## 2022-05-01 LAB — COMPREHENSIVE METABOLIC PANEL
ALT: 24 IU/L (ref 0–32)
AST: 30 IU/L (ref 0–40)
Albumin/Globulin Ratio: 1.3 (ref 1.2–2.2)
Albumin: 4.3 g/dL (ref 3.9–4.9)
Alkaline Phosphatase: 70 IU/L (ref 44–121)
BUN/Creatinine Ratio: 13 (ref 9–23)
BUN: 13 mg/dL (ref 6–24)
Bilirubin Total: 0.4 mg/dL (ref 0.0–1.2)
CO2: 22 mmol/L (ref 20–29)
Calcium: 10.3 mg/dL — ABNORMAL HIGH (ref 8.7–10.2)
Chloride: 100 mmol/L (ref 96–106)
Creatinine, Ser: 1.02 mg/dL — ABNORMAL HIGH (ref 0.57–1.00)
Globulin, Total: 3.3 g/dL (ref 1.5–4.5)
Glucose: 98 mg/dL (ref 70–99)
Potassium: 4.1 mmol/L (ref 3.5–5.2)
Sodium: 136 mmol/L (ref 134–144)
Total Protein: 7.6 g/dL (ref 6.0–8.5)
eGFR: 67 mL/min/{1.73_m2} (ref >59)

## 2022-05-01 LAB — FSH/LH
FSH: 17 m[IU]/mL
LH: 14.6 m[IU]/mL

## 2022-05-01 LAB — VITAMIN D 25 HYDROXY (VIT D DEFICIENCY, FRACTURES): Vit D, 25-Hydroxy: 24 ng/mL — ABNORMAL LOW (ref 30.0–100.0)

## 2022-05-01 LAB — THYROID PANEL WITH TSH
Free Thyroxine Index: 1.6 (ref 1.2–4.9)
T3 Uptake Ratio: 22 % — ABNORMAL LOW (ref 24–39)
T4, Total: 7.3 ug/dL (ref 4.5–12.0)
TSH: 1.83 u[IU]/mL (ref 0.450–4.500)

## 2022-05-01 MED ORDER — VITAMIN D (ERGOCALCIFEROL) 1.25 MG (50000 UNIT) PO CAPS
50000.0000 [IU] | ORAL_CAPSULE | ORAL | 0 refills | Status: DC
  Filled 2022-05-01: qty 4, 28d supply, fill #0
  Filled 2022-08-06: qty 12, 84d supply, fill #0

## 2022-05-07 ENCOUNTER — Other Ambulatory Visit: Payer: Self-pay

## 2022-06-19 ENCOUNTER — Other Ambulatory Visit: Payer: Self-pay

## 2022-06-20 ENCOUNTER — Other Ambulatory Visit: Payer: Self-pay

## 2022-06-26 ENCOUNTER — Emergency Department (HOSPITAL_COMMUNITY): Payer: Managed Care, Other (non HMO)

## 2022-06-26 ENCOUNTER — Emergency Department (HOSPITAL_COMMUNITY)
Admission: EM | Admit: 2022-06-26 | Discharge: 2022-06-26 | Discharge status: Against Medical Advice | Payer: Managed Care, Other (non HMO) | Attending: Emergency Medicine | Admitting: Emergency Medicine

## 2022-06-26 ENCOUNTER — Encounter (HOSPITAL_COMMUNITY): Payer: Self-pay | Admitting: Emergency Medicine

## 2022-06-26 DIAGNOSIS — Z5321 Procedure and treatment not carried out due to patient leaving prior to being seen by health care provider: Secondary | ICD-10-CM | POA: Insufficient documentation

## 2022-06-26 DIAGNOSIS — R202 Paresthesia of skin: Secondary | ICD-10-CM | POA: Insufficient documentation

## 2022-06-26 LAB — CBC WITH DIFFERENTIAL/PLATELET
Abs Immature Granulocytes: 0.02 10*3/uL (ref 0.00–0.07)
Basophils Absolute: 0.1 10*3/uL (ref 0.0–0.1)
Basophils Relative: 1 %
Eosinophils Absolute: 0.2 10*3/uL (ref 0.0–0.5)
Eosinophils Relative: 3 %
HCT: 37 % (ref 36.0–46.0)
Hemoglobin: 12.8 g/dL (ref 12.0–15.0)
Immature Granulocytes: 0 %
Lymphocytes Relative: 20 %
Lymphs Abs: 1.5 10*3/uL (ref 0.7–4.0)
MCH: 32.2 pg (ref 26.0–34.0)
MCHC: 34.6 g/dL (ref 30.0–36.0)
MCV: 93.2 fL (ref 80.0–100.0)
Monocytes Absolute: 0.5 10*3/uL (ref 0.1–1.0)
Monocytes Relative: 7 %
Neutro Abs: 5.5 10*3/uL (ref 1.7–7.7)
Neutrophils Relative %: 69 %
Platelets: 353 10*3/uL (ref 150–400)
RBC: 3.97 MIL/uL (ref 3.87–5.11)
RDW: 14.2 % (ref 11.5–15.5)
WBC: 7.8 10*3/uL (ref 4.0–10.5)
nRBC: 0 % (ref 0.0–0.2)

## 2022-06-26 LAB — BASIC METABOLIC PANEL
Anion gap: 8 (ref 5–15)
BUN: 13 mg/dL (ref 6–20)
CO2: 23 mmol/L (ref 22–32)
Calcium: 9.6 mg/dL (ref 8.9–10.3)
Chloride: 105 mmol/L (ref 98–111)
Creatinine, Ser: 0.82 mg/dL (ref 0.44–1.00)
GFR, Estimated: >60 mL/min (ref >60)
Glucose, Bld: 118 mg/dL — ABNORMAL HIGH (ref 70–99)
Potassium: 3.6 mmol/L (ref 3.5–5.1)
Sodium: 136 mmol/L (ref 135–145)

## 2022-06-26 NOTE — ED Provider Triage Note (Signed)
Emergency Medicine Provider Triage Evaluation Note  Latoya Juarez , a 51 y.o. female  was evaluated in triage.  Pt complains of paresthesias in the right arm and hand.  Also complains of fingertip numbness in the left hand.  Patient states symptoms have been ongoing for approximately 1 week.  Patient denies headache, shortness of breath, weakness, chest pain, dysuria  Review of Systems  Positive: As above Negative: As above  Physical Exam  BP (!) 142/89 (BP Location: Left Arm)   Pulse 69   Temp 98.3 F (36.8 C) (Oral)   Resp 16   LMP 06/24/2022   SpO2 100%  Gen:   Awake, no distress   Resp:  Normal effort  MSK:   Moves extremities without difficulty  Other:    Medical Decision Making  Medically screening exam initiated at 12:43 PM.  Appropriate orders placed.  Latoya Juarez was informed that the remainder of the evaluation will be completed by another provider, this initial triage assessment does not replace that evaluation, and the importance of remaining in the ED until their evaluation is complete.     Dorothyann Peng, PA-C 06/26/22 1248

## 2022-06-26 NOTE — ED Triage Notes (Signed)
Patient c/o tingling to R arm and bilateral fingers x1 week. Denies headache, chest pain, SOB.

## 2022-06-26 NOTE — ED Notes (Signed)
Pt not present in room and gown on bed

## 2022-07-22 NOTE — Progress Notes (Deleted)
Psychiatric Initial Adult Assessment  Patient Identification: Latoya Juarez MRN:  [id number] Date of Evaluation:  07/22/2022 Referral Source: Freeman Caldron, PA  Assessment:  Latoya Juarez is a 51 y.o. female with a history of anxiety, depression, Vitamin D deficiency, Fe deficiency anemia, prediabetes, HTN, and HLD  *** who presents to [place] via video conferencing for initial evaluation of ***.  Patient reports ***  Plan:  # Major depressive disorder  Anxiety Past medication trials: Lexapro, Atarax Status of problem: *** Interventions: -- *** -- Vitamin D deficiency: Vitamin D 24.0 04/30/22  -- Continue Vitamin D supplementation as managed by PCP -- History of Fe def anemia: not currently on supplementation and recent CBC 06/26/22 wnl  # *** Past medication trials:  Status of problem: *** Interventions: -- ***  # *** Past medication trials:  Status of problem: *** Interventions: -- ***  Patient was given contact information for behavioral health clinic and was instructed to call 911 for emergencies.   Subjective:  Chief Complaint: No chief complaint on file.   History of Present Illness:  ***  Loss of mother and brother years ago Lexapro 10 mg -> 20 daily started by PCP in July  Lexapro helpful? switch Consider switch to Zoloft   Past Psychiatric History:  Diagnoses: *** depression, anxiety Medication trials: *** Lexapro Previous psychiatrist/therapist: *** Hospitalizations: *** none; [facility] 2023 for worsening anxiety, depression, and grief reaction Suicide attempts: *** SIB: *** Hx of violence towards others: *** Current access to guns: *** Hx of abuse: ***  Substance Abuse History in the last 12 months:  {yes no:314532}  Past Medical History:  Past Medical History:  Diagnosis Date   Anemia    Fibroid    Hypertension    Obesity    Prediabetes     Past Surgical History:  Procedure Laterality Date    CESAREAN SECTION     COLONOSCOPY  11/22/2021   TUBAL LIGATION      Family Psychiatric History: ***  Family History:  Family History  Problem Relation Age of Onset   Diabetes Mother    Hypertension Mother    Stroke Mother    Hypertension Brother    Diabetes Brother    Stroke Brother    Breast cancer Maternal Aunt    Colon cancer Neg Hx    Esophageal cancer Neg Hx    Rectal cancer Neg Hx    Stomach cancer Neg Hx     Social History:   Social History   Socioeconomic History   Marital status: Legally Separated    Spouse name: Not on file   Number of children: Not on file   Years of education: Not on file   Highest education level: Not on file  Occupational History   Not on file  Tobacco Use   Smoking status: Every Day    Packs/day: 0.00    Types: Cigarettes   Smokeless tobacco: Never   Tobacco comments:    11-22-21- 7am  Vaping Use   Vaping Use: Never used  Substance and Sexual Activity   Alcohol use: Yes    Comment: socially   Drug use: No   Sexual activity: Yes    Birth control/protection: Surgical, Condom  Other Topics Concern   Not on file  Social History Narrative   Not on file   Social Determinants of Health   Financial Resource Strain: Not on file  Food Insecurity: Not on file  Transportation Needs: Not on  file  Physical Activity: Not on file  Stress: Not on file  Social Connections: Not on file    Additional Social History: ***  Allergies:   Allergies  Allergen Reactions   Hydroxyzine Other (See Comments)    Nightmares    Current Medications: Current Outpatient Medications  Medication Sig Dispense Refill   escitalopram (LEXAPRO) 20 MG tablet Take 1 tablet (20 mg total) by mouth daily. 90 tablet 1   fluticasone (FLONASE) 50 MCG/ACT nasal spray Place 2 sprays into both nostrils daily. 16 g 6   lisinopril-hydrochlorothiazide (ZESTORETIC) 10-12.5 MG tablet Take 1 tablet by mouth daily. 90 tablet 1   metFORMIN (GLUCOPHAGE) 500 MG tablet Take  1 tablet (500 mg total) by mouth daily with breakfast. 90 tablet 1   pravastatin (PRAVACHOL) 40 MG tablet Take 1 tablet (40 mg total) by mouth daily. 90 tablet 1   Vitamin D, Ergocalciferol, (DRISDOL) 1.25 MG (50000 UNIT) CAPS capsule Take 1 capsule (50,000 Units total) by mouth every 7 (seven) days. 12 capsule 0   No current facility-administered medications for this visit.    ROS: Review of Systems  Objective:  Psychiatric Specialty Exam: Last menstrual period 06/24/2022.There is no height or weight on file to calculate BMI.  General Appearance: {Appearance:22683}  Eye Contact:  {BHH EYE CONTACT:22684}  Speech:  {Speech:22685}  Volume:  {Volume (PAA):22686}  Mood:  {BHH MOOD:22306}  Affect:  {Affect (PAA):22687}  Thought Content: {Thought Content:22690}   Suicidal Thoughts:  {ST/HT (PAA):22692}  Homicidal Thoughts:  {ST/HT (PAA):22692}  Thought Process:  {Thought Process (PAA):22688}  Orientation:  {BHH ORIENTATION (PAA):22689}    Memory:  {BHH MEMORY:22881}  Judgment:  {Judgement (PAA):22694}  Insight:  {Insight (PAA):22695}  Concentration:  {Concentration:21399}  Recall:  {BHH GOOD/FAIR/POOR:22877}  Fund of Knowledge: {BHH GOOD/FAIR/POOR:22877}  Language: {BHH GOOD/FAIR/POOR:22877}  Psychomotor Activity:  {Psychomotor (PAA):22696}  Akathisia:  {BHH YES OR NO:22294}  AIMS (if indicated): {Desc; done/not:10129}  Assets:  {Assets (PAA):22698}  ADL's:  {BHH GYJ'E:56314}  Cognition: {chl bhh cognition:304700322}  Sleep:  {BHH GOOD/FAIR/POOR:22877}   PE: General: sits comfortably in view of camera; no acute distress *** Pulm: no increased work of breathing on room air *** MSK: all extremity movements appear intact *** Neuro: no focal neurological deficits observed *** Gait & Station: unable to assess by video ***   Metabolic Disorder Labs: Lab Results  Component Value Date   HGBA1C 5.7 (H) 07/25/2021   No results found for: "PROLACTIN" Lab Results  Component Value  Date   CHOL 211 (H) 04/30/2022   TRIG 91 04/30/2022   HDL 86 04/30/2022   CHOLHDL 2.5 04/30/2022   VLDL 17 06/17/2016   LDLCALC 109 (H) 04/30/2022   LDLCALC 124 (H) 07/25/2021   Lab Results  Component Value Date   TSH 1.830 04/30/2022    Therapeutic Level Labs: No results found for: "LITHIUM" No results found for: "CBMZ" No results found for: "VALPROATE"  Screenings:  GAD-7    Flowsheet Row Office Visit from 04/30/2022 in [place] Office Visit from 08/02/2021 in [place] Immunization from 08/15/2020 in [place] Office Visit from 06/06/2020 in [place] Office Visit from 07/19/2019 in [place]  Total GAD-7 Score 6 0 0 2 0      PHQ2-9    [place] Office Visit from 04/30/2022 in [place] Office Visit from 08/02/2021 in [facility]  [place] Immunization from 08/15/2020 in [place] Office Visit from 06/06/2020 in [place] Office Visit from 01/03/2020 in [place]  PHQ-2 Total Score 2 0 0 2 0  PHQ-9 Total Score 2 0 -- 3 0       Collaboration of Care: Collaboration of Care: [facility] OP Collaboration of AFBX:[id number]}  Patient/Guardian was advised Release of Information must be obtained prior to any record release in order to collaborate their care with an outside provider. Patient/Guardian was advised if they have not already done so to contact the registration department to sign all necessary forms in order for [place] to release information regarding their care.   Consent: Patient/Guardian gives verbal consent for treatment and assignment of benefits for services provided during this visit. Patient/Guardian expressed understanding and agreed to proceed.   Televisit via video: I  connected with Latoya Juarez on 07/22/22 at 10:00 AM EDT by a video enabled telemedicine application and verified that I am speaking with the correct person using two identifiers.  Location: Patient: *** Provider: remote in [place]   I discussed the limitations of evaluation and management by telemedicine and the availability of in person appointments. The patient expressed understanding and agreed to proceed.  I discussed the assessment and treatment plan with the patient. The patient was provided an opportunity to ask questions and all were answered. The patient agreed with the plan and demonstrated an understanding of the instructions.   The patient was advised to call back or seek an in-person evaluation if the symptoms worsen or if the condition fails to improve as anticipated.  I provided *** minutes of non-face-to-face time during this encounter.  [place] 10/3/20237:40 PM

## 2022-07-23 ENCOUNTER — Telehealth (HOSPITAL_COMMUNITY): Payer: Self-pay | Admitting: Psychiatry

## 2022-07-23 ENCOUNTER — Ambulatory Visit (HOSPITAL_COMMUNITY): Payer: Self-pay | Admitting: Student

## 2022-07-23 NOTE — Patient Instructions (Signed)
Thank you for attending your appointment today.  -- START Zoloft 25 mg daily for 6 days; if tolerating well increase to 50 mg daily thereafter. -- Continue other medications as prescribed.  Please do not make any changes to medications without first discussing with your provider. If you are experiencing a psychiatric emergency, please call 911 or present to your nearest emergency department. Additional crisis, medication management, and therapy resources are included below.  [facility]  [address], [place], [place] [address] [phone] or [phone] [facility] 24/7 FOR ANYONE [address] [place], [place] Fax: [phone] [email or web address] *Interpreters available *Accepts all insurance and uninsured for Urgent Care needs *Accepts Medicaid and uninsured for outpatient treatment (below)      ONLY FOR [facility]  Below:    Outpatient New Patient Assessment/Therapy Walk-ins:        Monday -Thursday 8am until slots are full.        Every Friday 1pm-4pm  (first come, first served)                   New Patient Psychiatry/Medication Management        Monday-Friday 8am-11am (first come, first served)               For all walk-ins we ask that you arrive by 7:15am, because patients will be seen in the order of arrival.
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
## 2022-07-25 ENCOUNTER — Encounter (HOSPITAL_COMMUNITY): Payer: Self-pay | Admitting: Psychiatry

## 2022-07-25 ENCOUNTER — Other Ambulatory Visit: Payer: Self-pay

## 2022-07-25 ENCOUNTER — Ambulatory Visit (INDEPENDENT_AMBULATORY_CARE_PROVIDER_SITE_OTHER): Payer: No Payment, Other | Admitting: Psychiatry

## 2022-07-25 DIAGNOSIS — F1290 Cannabis use, unspecified, uncomplicated: Secondary | ICD-10-CM | POA: Insufficient documentation

## 2022-07-25 DIAGNOSIS — F332 Major depressive disorder, recurrent severe without psychotic features: Secondary | ICD-10-CM | POA: Diagnosis not present

## 2022-07-25 DIAGNOSIS — F339 Major depressive disorder, recurrent, unspecified: Secondary | ICD-10-CM | POA: Insufficient documentation

## 2022-07-25 DIAGNOSIS — F1090 Alcohol use, unspecified, uncomplicated: Secondary | ICD-10-CM | POA: Insufficient documentation

## 2022-07-25 MED ORDER — SERTRALINE HCL 50 MG PO TABS
ORAL_TABLET | ORAL | 2 refills | Status: DC
Start: 2022-07-25 — End: 2023-01-07
  Filled 2022-07-25 – 2022-08-08 (×2): qty 30, 30d supply, fill #0

## 2022-08-01 ENCOUNTER — Other Ambulatory Visit: Payer: Self-pay

## 2022-08-06 ENCOUNTER — Other Ambulatory Visit: Payer: Self-pay

## 2022-08-08 ENCOUNTER — Other Ambulatory Visit: Payer: Self-pay

## 2022-08-14 ENCOUNTER — Other Ambulatory Visit: Payer: Self-pay

## 2022-09-05 ENCOUNTER — Encounter (HOSPITAL_COMMUNITY): Payer: No Payment, Other | Admitting: Psychiatry

## 2022-09-05 ENCOUNTER — Encounter (HOSPITAL_COMMUNITY): Payer: Self-pay

## 2022-09-05 NOTE — Progress Notes (Signed)
Patient did not connect for virtual psychiatric medication management appointment on 09/05/22 at [place] phone with no answer; left VM with callback number to reschedule.  Alda Berthold, MD 09/05/22

## 2022-09-23 ENCOUNTER — Other Ambulatory Visit: Payer: Self-pay

## 2022-09-26 ENCOUNTER — Other Ambulatory Visit: Payer: Self-pay

## 2022-11-04 ENCOUNTER — Other Ambulatory Visit: Payer: Self-pay

## 2022-12-09 ENCOUNTER — Other Ambulatory Visit: Payer: Self-pay | Admitting: Physician Assistant

## 2022-12-09 ENCOUNTER — Other Ambulatory Visit: Payer: Self-pay

## 2022-12-09 DIAGNOSIS — E782 Mixed hyperlipidemia: Secondary | ICD-10-CM

## 2022-12-09 MED ORDER — PRAVASTATIN SODIUM 40 MG PO TABS
40.0000 mg | ORAL_TABLET | Freq: Every day | ORAL | 0 refills | Status: DC
  Filled 2022-12-09: qty 90, 90d supply, fill #0

## 2022-12-10 ENCOUNTER — Other Ambulatory Visit: Payer: Self-pay

## 2022-12-11 ENCOUNTER — Other Ambulatory Visit: Payer: Self-pay

## 2022-12-11 ENCOUNTER — Other Ambulatory Visit: Payer: Self-pay | Admitting: Nurse Practitioner

## 2022-12-11 ENCOUNTER — Other Ambulatory Visit: Payer: Self-pay | Admitting: Physician Assistant

## 2022-12-11 DIAGNOSIS — R7303 Prediabetes: Secondary | ICD-10-CM

## 2022-12-11 NOTE — Telephone Encounter (Signed)
Requested medication (s) are due for refill today: yes  Requested medication (s) are on the active medication list: yes  Last refill:  04/30/22  Future visit scheduled: no  Notes to clinic:  Unable to refill per protocol due to failed labs, no updated results.      Requested Prescriptions  Pending Prescriptions Disp Refills   metFORMIN (GLUCOPHAGE) 500 MG tablet 90 tablet 1    Sig: Take 1 tablet (500 mg total) by mouth daily with breakfast.     Endocrinology:  Diabetes - Biguanides Failed - 12/11/2022 10:25 AM      Failed - HBA1C is between 0 and 7.9 and within 180 days    Hgb A1c MFr Bld  Date Value Ref Range Status  07/25/2021 5.7 (H) 4.8 - 5.6 % Final    Comment:             Prediabetes: 5.7 - 6.4          Diabetes: >6.4          Glycemic control for adults with diabetes: <7.0          Failed - B12 Level in normal range and within 720 days    No results found for: "VITAMINB12"       Failed - Valid encounter within last 6 months    Recent Outpatient Visits           7 months ago Anxiety and depression   [place], [place], [place]   1 year ago RLQ abdominal mass   [place], Vernia Buff, NP   1 year ago Right upper quadrant abdominal mass   [place], Vernia Buff, NP   1 year ago Encounter for annual physical exam   [place], Vernia Buff, NP   1 year ago Essential hypertension   [place], [place] W, NP              Passed - Cr in normal range and within 360 days    Creat  Date Value Ref Range Status  06/17/2016 0.61 0.50 - 1.10 mg/dL Final   Creatinine, Ser  Date Value Ref Range Status  06/26/2022 0.82 0.44 - 1.00 mg/dL Final         Passed - eGFR in normal range and within 360 days    GFR, Est African American  Date Value Ref Range Status   06/17/2016 >89 >=60 mL/min Final   GFR calc Af Amer  Date Value Ref Range Status  11/24/2019 95 >59 mL/min/1.73 Final   GFR, Est Non African American  Date Value Ref Range Status  06/17/2016 >89 >=60 mL/min Final   GFR, Estimated  Date Value Ref Range Status  06/26/2022 >60 >60 mL/min Final    Comment:    (NOTE) Calculated using the CKD-EPI Creatinine Equation (2021)    eGFR  Date Value Ref Range Status  04/30/2022 67 >59 mL/min/1.73 Final         Passed - CBC within normal limits and completed in the last 12 months    WBC  Date Value Ref Range Status  06/26/2022 7.8 4.0 - 10.5 K/uL Final   RBC  Date Value Ref Range Status  06/26/2022 3.97 3.87 - 5.11 MIL/uL Final   Hemoglobin  Date Value Ref Range Status  06/26/2022 12.8 12.0 - 15.0  g/dL Final  04/30/2022 12.7 11.1 - 15.9 g/dL Final   HCT  Date Value Ref Range Status  06/26/2022 37.0 36.0 - 46.0 % Final   Hematocrit  Date Value Ref Range Status  04/30/2022 37.5 34.0 - 46.6 % Final   MCHC  Date Value Ref Range Status  06/26/2022 34.6 30.0 - 36.0 g/dL Final   [facility]  Date Value Ref Range Status  06/26/2022 32.2 26.0 - 34.0 pg Final   MCV  Date Value Ref Range Status  06/26/2022 93.2 80.0 - 100.0 fL Final  04/30/2022 93 79 - 97 fL Final   No results found for: "PLTCOUNTKUC", "LABPLAT", "POCPLA" RDW  Date Value Ref Range Status  06/26/2022 14.2 11.5 - 15.5 % Final  04/30/2022 11.9 11.7 - 15.4 % Final

## 2023-01-01 ENCOUNTER — Ambulatory Visit: Payer: Self-pay | Admitting: *Deleted

## 2023-01-01 NOTE — Telephone Encounter (Signed)
Reason for Disposition  [1] MILD pain (e.g., does not interfere with normal activities) AND [2] pain comes and goes (cramps) AND [3] present > 48 hours  (Exception: This same abdominal pain is a chronic symptom recurrent or ongoing AND present > 4 weeks.)  Answer Assessment - Initial Assessment Questions 1. LOCATION: "Where does it hurt?"      Top of my stomach feels heavy and tight and hard on right side.    I have a lot of fibroids.    My cycle gets off.    I was on my cycle.   I get bloated during my cycle.    Yesterday I was feeling pressure and almost went to the ED.   Then it got better after I passed gas I was better.   I'm having a lot of gas.  2. RADIATION: "Does the pain shoot anywhere else?" (e.g., chest, back)     No 3. ONSET: "When did the pain begin?" (e.g., minutes, hours or days ago)      A week ago.    I feel like having a BM but when I go I feel like it doesn't all come out.    I have a history of constipation.    I take a laxative.     My stomach cramps and hurts when I'm constipated.    4. SUDDEN: "Gradual or sudden onset?"     Not asked 5. PATTERN "Does the pain come and go, or is it constant?"    - If it comes and goes: "How long does it last?" "Do you have pain now?"     (Note: Comes and goes means the pain is intermittent. It goes away completely between bouts.)    - If constant: "Is it getting better, staying the same, or getting worse?"      (Note: Constant means the pain never goes away completely; most serious pain is constant and gets worse.)      It comes and goes 6. SEVERITY: "How bad is the pain?"  (e.g., Scale 1-10; mild, moderate, or severe)    - MILD (1-3): Doesn't interfere with normal activities, abdomen soft and not tender to touch.     - MODERATE (4-7): Interferes with normal activities or awakens from sleep, abdomen tender to touch.     - SEVERE (8-10): Excruciating pain, doubled over, unable to do any normal activities.       It feels heavy and  bloated. 7. RECURRENT SYMPTOM: "Have you ever had this type of stomach pain before?" If Yes, ask: "When was the last time?" and "What happened that time?"      Not asked 8. CAUSE: "What do you think is causing the stomach pain?"     Gas 9. RELIEVING/AGGRAVATING FACTORS: "What makes it better or worse?" (e.g., antacids, bending or twisting motion, bowel movement)     I take Gas X and it helps with the gas.    I did an enema last night without results. 10. OTHER SYMPTOMS: "Do you have any other symptoms?" (e.g., back pain, diarrhea, fever, urination pain, vomiting)       No 11. PREGNANCY: "Is there any chance you are pregnant?" "When was your last menstrual period?"       Not asked  Protocols used: Abdominal Pain - [facility]

## 2023-01-01 NOTE — Telephone Encounter (Signed)
  Chief Complaint: Abd pain in URQ and bloating Symptoms: abd pain and bloating with a hard feeling in upper right Quadrant of abd. Frequency: For a week now   Also dealing with constipation Pertinent Negatives: Patient denies diarrhea but does have intermittent constipation.   Disposition: [] ED /[] Urgent Care (no appt availability in office) / [x] Appointment(In office/virtual)/ []  [facility]/ [] Home Care/ [] Refused Recommended Disposition /[] [facility]/ []  Follow-up with PCP Additional Notes: Appt. Made with Geryl Rankins, NP for 02/03/2023 at 3:10.   She only wanted to see Zelda.   Instructed to go to the urgent care or ED if the pain became worse and to continue with the Gas X since she said it is helping with the gas.

## 2023-01-07 ENCOUNTER — Other Ambulatory Visit: Payer: Self-pay

## 2023-01-07 ENCOUNTER — Ambulatory Visit: Payer: Self-pay | Attending: Nurse Practitioner | Admitting: Nurse Practitioner

## 2023-01-07 ENCOUNTER — Encounter: Payer: Self-pay | Admitting: Nurse Practitioner

## 2023-01-07 ENCOUNTER — Other Ambulatory Visit: Payer: Self-pay | Admitting: Physician Assistant

## 2023-01-07 VITALS — BP 115/71 | HR 92 | Ht 61.0 in | Wt 219.6 lb

## 2023-01-07 DIAGNOSIS — I10 Essential (primary) hypertension: Secondary | ICD-10-CM

## 2023-01-07 DIAGNOSIS — R1907 Generalized intra-abdominal and pelvic swelling, mass and lump: Secondary | ICD-10-CM

## 2023-01-07 DIAGNOSIS — Z23 Encounter for immunization: Secondary | ICD-10-CM

## 2023-01-07 DIAGNOSIS — D259 Leiomyoma of uterus, unspecified: Secondary | ICD-10-CM

## 2023-01-07 DIAGNOSIS — Z13228 Encounter for screening for other metabolic disorders: Secondary | ICD-10-CM | POA: Insufficient documentation

## 2023-01-07 DIAGNOSIS — R7303 Prediabetes: Secondary | ICD-10-CM

## 2023-01-07 DIAGNOSIS — K5909 Other constipation: Secondary | ICD-10-CM

## 2023-01-07 DIAGNOSIS — E7800 Pure hypercholesterolemia, unspecified: Secondary | ICD-10-CM

## 2023-01-07 DIAGNOSIS — Z1231 Encounter for screening mammogram for malignant neoplasm of breast: Secondary | ICD-10-CM

## 2023-01-07 DIAGNOSIS — D509 Iron deficiency anemia, unspecified: Secondary | ICD-10-CM

## 2023-01-07 MED ORDER — SENNOSIDES-DOCUSATE SODIUM 8.6-50 MG PO TABS
2.0000 | ORAL_TABLET | Freq: Every day | ORAL | 1 refills | Status: DC
  Filled 2023-01-07 – 2023-01-14 (×2): qty 180, 90d supply, fill #0

## 2023-01-07 MED ORDER — LINACLOTIDE 72 MCG PO CAPS
72.0000 ug | ORAL_CAPSULE | Freq: Every day | ORAL | 1 refills | Status: DC
  Filled 2023-01-07: qty 30, 15d supply, fill #0
  Filled 2023-01-14: qty 30, 30d supply, fill #0

## 2023-01-07 MED ORDER — METFORMIN HCL 500 MG PO TABS
500.0000 mg | ORAL_TABLET | Freq: Every day | ORAL | 1 refills | Status: DC
  Filled 2023-01-07: qty 90, 90d supply, fill #0
  Filled 2023-01-08 – 2023-01-14 (×2): qty 30, 30d supply, fill #0
  Filled 2023-02-18: qty 30, 30d supply, fill #1
  Filled 2023-04-02: qty 30, 30d supply, fill #2
  Filled 2023-04-27 – 2023-05-07 (×2): qty 30, 30d supply, fill #3

## 2023-01-07 NOTE — Progress Notes (Signed)
Assessment & Plan:  [name] was seen today for abdominal pain, bloated and constipation.  Diagnoses and all orders for this visit:  Prediabetes -     metFORMIN (GLUCOPHAGE) 500 MG tablet; Take 1 tablet (500 mg total) by mouth daily with breakfast. -     CMP14+EGFR -     Hemoglobin A1c  Iron deficiency anemia, unspecified iron deficiency anemia type -     CBC with Differential  Pure hypercholesterolemia -     Lipid panel INSTRUCTIONS: Work on a low fat, heart healthy diet and participate in regular aerobic exercise program by working out at least 150 minutes per week; 5 days a week-30 minutes per day. Avoid red meat/beef/steak,  fried foods. junk foods, sodas, sugary drinks, unhealthy snacking, alcohol and smoking.  Drink at least 80 oz of water per day and monitor your carbohydrate intake daily.    Uterine leiomyoma, unspecified location -     Ambulatory referral to Gynecology -     US PELVIC COMPLETE WITH TRANSVAGINAL; Future  Chronic constipation -     senna-docusate (SENOKOT-S) 8.6-50 MG tablet; Take 2 tablets by mouth daily. -     linaclotide (LINZESS) 72 MCG capsule; Take 1-2 capsules (72-144 mcg total) by mouth daily before breakfast.  Breast cancer screening by mammogram -     MM DIGITAL SCREENING BILATERAL; Future  Need for Tdap vaccination -     Tdap vaccine greater than or equal to 7yo IM  Generalized abdominal or pelvic swelling or mass or lump -     US PELVIC COMPLETE WITH TRANSVAGINAL; Future    Patient has been counseled on age-appropriate routine health concerns for screening and prevention. These are reviewed and up-to-date. Referrals have been placed accordingly. Immunizations are up-to-date or declined.    Subjective:   Chief Complaint  Patient presents with   Abdominal Pain   Bloated   Constipation   Abdominal Pain Associated symptoms include constipation. Pertinent negatives include no diarrhea, fever, headaches, melena, myalgias, nausea, vomiting  or weight loss.  Constipation Associated symptoms include abdominal pain. Pertinent negatives include no diarrhea, fever, melena, nausea, vomiting or weight loss.   Latoya Juarez 52 y.o. female presents to office today with complaints of abdominal pain and bloating.   Abdominal Pain Endorses chronic abdominal pain: epigastric, RUQ, RLQ and chronic constipation. OTC laxatives and stool softeners have either been too harsh or ineffective. Associated symptoms include bloating.  Pain described as sharp and pressure. Last BM was yesterday however stools are very hard and she does not feel she has completely evacuated when she has a BM. Declines discontinuing zestoretic which could be contributing to constipation. She was noted by me to have a palpable mass in the RLQ on physical exam 08-02-2021. CT revealed numerous fibroids and I referred her to GYN at that time. She was lost to follow up. Will need referral to GYN today and pelvic US. She is also overdue for PAP. Due to time constraints today we were unable to complete this.  Denies rectal bleeding, N/V, melena, hematochezia. States she feels like she is pregnant and feels something hard in her stomach on the right side.     Review of Systems  Constitutional:  Negative for fever, malaise/fatigue and weight loss.  HENT: Negative.  Negative for nosebleeds.   Eyes: Negative.  Negative for blurred vision, double vision and photophobia.  Respiratory: Negative.  Negative for cough and shortness of breath.   Cardiovascular: Negative.  Negative for chest pain,  palpitations and leg swelling.  Gastrointestinal:  Positive for abdominal pain and constipation. Negative for blood in stool, diarrhea, heartburn, melena, nausea and vomiting.  Musculoskeletal: Negative.  Negative for myalgias.  Neurological: Negative.  Negative for dizziness, focal weakness, seizures and headaches.  Psychiatric/Behavioral:  Positive for depression. Negative for suicidal ideas.      Past Medical History:  Diagnosis Date   Anemia    Depression    Fibroid    Hypertension    Obesity    Prediabetes     Past Surgical History:  Procedure Laterality Date   CESAREAN SECTION     COLONOSCOPY  11/22/2021   TUBAL LIGATION      Family History  Problem Relation Age of Onset   Diabetes Mother    Hypertension Mother    Stroke Mother    Hypertension Brother    Diabetes Brother    Stroke Brother    Heart attack Brother    Breast cancer Maternal Aunt    Colon cancer Neg Hx    Esophageal cancer Neg Hx    Rectal cancer Neg Hx    Stomach cancer Neg Hx     Social History Reviewed with no changes to be made today.   Outpatient Medications Prior to Visit  Medication Sig Dispense Refill   fluticasone (FLONASE) 50 MCG/ACT nasal spray Place 2 sprays into both nostrils daily. 16 g 6   lisinopril-hydrochlorothiazide (ZESTORETIC) 10-12.5 MG tablet Take 1 tablet by mouth daily. 90 tablet 1   pravastatin (PRAVACHOL) 40 MG tablet Take 1 tablet (40 mg total) by mouth daily. 90 tablet 0   metFORMIN (GLUCOPHAGE) 500 MG tablet Take 1 tablet (500 mg total) by mouth daily with breakfast. (Patient not taking: Reported on 01/07/2023) 90 tablet 1   sertraline (ZOLOFT) 50 MG tablet Take 1/2 tablet (25 mg total) daily for 6 days then INCREASE to 1 tablet (50 mg total) daily thereafter. (Patient not taking: Reported on 01/07/2023) 30 tablet 2   Vitamin D, Ergocalciferol, (DRISDOL) 1.25 MG (50000 UNIT) CAPS capsule Take 1 capsule (50,000 Units total) by mouth every 7 (seven) days. (Patient not taking: Reported on 01/07/2023) 12 capsule 0   No facility-administered medications prior to visit.    Allergies  Allergen Reactions   Hydroxyzine Other (See Comments)    Nightmares       Objective:    BP 115/71   Pulse 92   Ht 5\' 1"  (1.549 m)   Wt 219 lb 9.6 oz (99.6 kg)   LMP 12/29/2022 (Exact Date)   SpO2 98%   BMI 41.49 kg/m  Wt Readings from Last 3 Encounters:  01/07/23 219 lb  9.6 oz (99.6 kg)  04/30/22 215 lb 6.4 oz (97.7 kg)  11/22/21 215 lb (97.5 kg)    Physical Exam Vitals and nursing note reviewed.  Constitutional:      Appearance: She is well-developed.  HENT:     Head: Normocephalic and atraumatic.  Cardiovascular:     Rate and Rhythm: Normal rate and regular rhythm.     Heart sounds: Normal heart sounds. No murmur heard.    No friction rub. No gallop.  Pulmonary:     Effort: Pulmonary effort is normal. No tachypnea or respiratory distress.     Breath sounds: Normal breath sounds. No decreased breath sounds, wheezing, rhonchi or rales.  Chest:     Chest wall: No tenderness.  Abdominal:     General: Abdomen is protuberant. Bowel sounds are normal.     Tenderness:  There is abdominal tenderness in the right upper quadrant and right lower quadrant.    Musculoskeletal:        General: Normal range of motion.     Cervical back: Normal range of motion.  Skin:    General: Skin is warm and dry.  Neurological:     Mental Status: She is alert and oriented to person, place, and time.     Coordination: Coordination normal.  Psychiatric:        Behavior: Behavior normal. Behavior is cooperative.        Thought Content: Thought content normal.        Judgment: Judgment normal.          Patient has been counseled extensively about nutrition and exercise as well as the importance of adherence with medications and regular follow-up. The patient was given clear instructions to go to ER or return to medical center if symptoms don't improve, worsen or new problems develop. The patient verbalized understanding.   Follow-up: Return in about 3 months (around 04/09/2023).   [name], FNP-BC [facility] [place], [place]   01/07/2023, 5:39 PM

## 2023-01-07 NOTE — Progress Notes (Signed)
Pain in upper abdomen and constipation. Abdomen pain stared a week ago and bloating has been on and off for a more than a month.

## 2023-01-08 ENCOUNTER — Other Ambulatory Visit: Payer: Self-pay

## 2023-01-08 LAB — CBC WITH DIFFERENTIAL/PLATELET
Basophils Absolute: 0.1 10*3/uL (ref 0.0–0.2)
Basos: 1 %
EOS (ABSOLUTE): 0.2 10*3/uL (ref 0.0–0.4)
Eos: 3 %
Hematocrit: 34.7 % (ref 34.0–46.6)
Hemoglobin: 11.1 g/dL (ref 11.1–15.9)
Immature Grans (Abs): 0 10*3/uL (ref 0.0–0.1)
Immature Granulocytes: 0 %
Lymphocytes Absolute: 1.6 10*3/uL (ref 0.7–3.1)
Lymphs: 23 %
MCH: 26.9 pg (ref 26.6–33.0)
MCHC: 32 g/dL (ref 31.5–35.7)
MCV: 84 fL (ref 79–97)
Monocytes Absolute: 0.6 10*3/uL (ref 0.1–0.9)
Monocytes: 10 %
Neutrophils Absolute: 4.2 10*3/uL (ref 1.4–7.0)
Neutrophils: 63 %
Platelets: 400 10*3/uL (ref 150–450)
RBC: 4.12 x10E6/uL (ref 3.77–5.28)
RDW: 16 % — ABNORMAL HIGH (ref 11.7–15.4)
WBC: 6.7 10*3/uL (ref 3.4–10.8)

## 2023-01-08 LAB — CMP14+EGFR
ALT: 10 IU/L (ref 0–32)
AST: 22 IU/L (ref 0–40)
Albumin/Globulin Ratio: 1.3 (ref 1.2–2.2)
Albumin: 4 g/dL (ref 3.8–4.9)
Alkaline Phosphatase: 76 IU/L (ref 44–121)
BUN/Creatinine Ratio: 9 (ref 9–23)
BUN: 11 mg/dL (ref 6–24)
Bilirubin Total: 0.5 mg/dL (ref 0.0–1.2)
CO2: 23 mmol/L (ref 20–29)
Calcium: 9.6 mg/dL (ref 8.7–10.2)
Chloride: 100 mmol/L (ref 96–106)
Creatinine, Ser: 1.21 mg/dL — ABNORMAL HIGH (ref 0.57–1.00)
Globulin, Total: 3.2 g/dL (ref 1.5–4.5)
Glucose: 114 mg/dL — ABNORMAL HIGH (ref 70–99)
Potassium: 4.2 mmol/L (ref 3.5–5.2)
Sodium: 138 mmol/L (ref 134–144)
Total Protein: 7.2 g/dL (ref 6.0–8.5)
eGFR: 54 mL/min/{1.73_m2} — ABNORMAL LOW (ref >59)

## 2023-01-08 LAB — LIPID PANEL
Chol/HDL Ratio: 2.9 ratio (ref 0.0–4.4)
Cholesterol, Total: 220 mg/dL — ABNORMAL HIGH (ref 100–199)
HDL: 76 mg/dL (ref >39)
LDL Chol Calc (NIH): 118 mg/dL — ABNORMAL HIGH (ref 0–99)
Triglycerides: 149 mg/dL (ref 0–149)
VLDL Cholesterol Cal: 26 mg/dL (ref 5–40)

## 2023-01-08 LAB — HEMOGLOBIN A1C
Est. average glucose Bld gHb Est-mCnc: 117 mg/dL
Hgb A1c MFr Bld: 5.7 % — ABNORMAL HIGH (ref 4.8–5.6)

## 2023-01-08 MED ORDER — LISINOPRIL-HYDROCHLOROTHIAZIDE 10-12.5 MG PO TABS
1.0000 | ORAL_TABLET | Freq: Every day | ORAL | 1 refills | Status: DC
  Filled 2023-01-08 – 2023-01-14 (×2): qty 30, 30d supply, fill #0
  Filled 2023-02-18: qty 30, 30d supply, fill #1
  Filled 2023-04-02: qty 30, 30d supply, fill #2
  Filled 2023-04-27 – 2023-05-07 (×2): qty 30, 30d supply, fill #3

## 2023-01-08 NOTE — Addendum Note (Signed)
Addended by: Gasper Lloyd on: 01/08/2023 01:08 PM   Modules accepted: Orders

## 2023-01-12 ENCOUNTER — Ambulatory Visit (HOSPITAL_COMMUNITY)
Admission: RE | Admit: 2023-01-12 | Discharge: 2023-01-12 | Disposition: A | Discharge status: Home / Self Care | Payer: 59 | Source: Ambulatory Visit | Attending: Nurse Practitioner | Admitting: Nurse Practitioner

## 2023-01-12 DIAGNOSIS — D259 Leiomyoma of uterus, unspecified: Secondary | ICD-10-CM | POA: Insufficient documentation

## 2023-01-12 DIAGNOSIS — R1907 Generalized intra-abdominal and pelvic swelling, mass and lump: Secondary | ICD-10-CM | POA: Diagnosis present

## 2023-01-13 ENCOUNTER — Other Ambulatory Visit: Payer: Self-pay

## 2023-01-13 DIAGNOSIS — N6320 Unspecified lump in the left breast, unspecified quadrant: Secondary | ICD-10-CM

## 2023-01-14 ENCOUNTER — Other Ambulatory Visit: Payer: Self-pay

## 2023-01-27 ENCOUNTER — Telehealth: Payer: Self-pay | Admitting: Nurse Practitioner

## 2023-01-27 NOTE — Telephone Encounter (Signed)
Copied from CRM [id number]. Topic: Referral - Question >> Jan 27, 2023  3:43 PM Marlow Baars wrote: Reason for CRM: The patient called and stated that [facility] does not have any available appts until June and the patient wants to be seen sooner if possible due to painful fibroids. Please assist patient further.

## 2023-01-28 NOTE — Telephone Encounter (Signed)
Patient aware of response from Arna Medici and verified that she does have insurance. Patient stated that she would send a picture of her card in response to the massage I sent her with the new offices information.

## 2023-01-29 ENCOUNTER — Other Ambulatory Visit: Payer: Self-pay | Admitting: Obstetrics and Gynecology

## 2023-01-29 ENCOUNTER — Ambulatory Visit
Admission: RE | Admit: 2023-01-29 | Discharge: 2023-01-29 | Disposition: A | Discharge status: Home / Self Care | Payer: No Typology Code available for payment source | Source: Ambulatory Visit | Attending: Obstetrics and Gynecology | Admitting: Obstetrics and Gynecology

## 2023-01-29 ENCOUNTER — Ambulatory Visit: Payer: Self-pay | Admitting: Hematology and Oncology

## 2023-01-29 VITALS — BP 111/77 | Wt 212.0 lb

## 2023-01-29 DIAGNOSIS — N6320 Unspecified lump in the left breast, unspecified quadrant: Secondary | ICD-10-CM

## 2023-01-29 NOTE — Progress Notes (Signed)
[name] is a 52 y.o. female who presents to [facility] clinic today with no complaints. Follow up probable benign left breast mass.   Pap Smear: Pap not smear completed today. Last Pap smear was 2020 and was normal. Per patient has no history of an abnormal Pap smear. Last Pap smear result is available in Epic.   Physical exam: Breasts Breasts symmetrical. No skin abnormalities bilateral breasts. No nipple retraction bilateral breasts. No nipple discharge bilateral breasts. No lymphadenopathy. No lumps palpated bilateral breasts.   MM DIAG BREAST TOMO BILATERAL  Result Date: 03/24/2018 CLINICAL DATA:  One year follow-up for probably benign LEFT breast mass. EXAM: DIGITAL DIAGNOSTIC BILATERAL MAMMOGRAM WITH CAD AND TOMO COMPARISON:  09/14/2017 and earlier ACR Breast Density Category b: There are scattered areas of fibroglandular density. FINDINGS: A circumscribed low-density oval mass is again identified in the UPPER-OUTER QUADRANT of the LEFT breast, unchanged compared with prior study. No suspicious mass, distortion, or microcalcifications are identified to suggest presence of malignancy. Mammographic images were processed with CAD. IMPRESSION: 1.  No mammographic evidence for malignancy. 2. Stable appearance of probably benign LEFT breast mass, possibly an intramammary lymph node. No sonographic abnormality was identified in this region on previous evaluation. RECOMMENDATION: Bilateral diagnostic mammogram is recommended in 1 year. I have discussed the findings and recommendations with the patient. Results were also provided in writing at the conclusion of the visit. If applicable, a reminder letter will be sent to the patient regarding the next appointment. BI-RADS CATEGORY  3: Probably benign. Electronically Signed   By: Norva Pavlov M.D.   On: 03/24/2018 08:16        Pelvic/Bimanual Pap is not indicated today    Smoking History: Patient has is a current smoker at 1/2 packs per day and  was referred to quit line.   Patient Navigation: Patient education provided. Access to services provided for patient through [facility] program. No interpreter provided. No transportation provided   Colorectal Cancer Screening: Per patient has had colonoscopy completed on 2023 at [facility] and was normal. Recommend 10 year follow up.  No complaints today.    Breast and Cervical Cancer Risk Assessment: Patient does not have family history of breast cancer, known genetic mutations, or radiation treatment to the chest before age 36. Patient does not have history of cervical dysplasia, immunocompromised, or DES exposure in-utero.  Risk Assessment   No risk assessment data     A: [facility] exam without pap smear No complaints. Continued follow up probable benign left breast mass.   P: Referred patient to the [facility] for a diagnostic mammogram. Appointment scheduled 01/29/23.  Ilda Basset A, NP 01/29/2023 9:00 AM

## 2023-01-29 NOTE — Patient Instructions (Signed)
Taught Latoya Juarez about self breast awareness and gave educational materials to take home. Patient did not need a Pap smear today due to last Pap smear was in 2020 per patient. Let her know [facility]CP will cover Pap smears every 5 years unless has a history of abnormal Pap smears. Referred patient to the [facility] for diagnostic mammogram. Appointment scheduled for 01/29/23. Patient aware of appointment and will be there. Let patient know will follow up with her within the next couple weeks with results. Latoya Juarez verbalized understanding.  Pascal Lux, NP 9:01 AM

## 2023-01-30 ENCOUNTER — Encounter: Payer: Self-pay | Admitting: Nurse Practitioner

## 2023-02-03 ENCOUNTER — Ambulatory Visit: Payer: Self-pay | Admitting: Nurse Practitioner

## 2023-02-19 ENCOUNTER — Other Ambulatory Visit: Payer: Self-pay | Admitting: Nurse Practitioner

## 2023-02-19 ENCOUNTER — Other Ambulatory Visit: Payer: Self-pay

## 2023-02-19 DIAGNOSIS — E559 Vitamin D deficiency, unspecified: Secondary | ICD-10-CM

## 2023-02-20 ENCOUNTER — Encounter: Payer: Self-pay | Admitting: Pharmacist

## 2023-02-20 ENCOUNTER — Other Ambulatory Visit: Payer: Self-pay

## 2023-03-23 ENCOUNTER — Encounter: Payer: 59 | Admitting: Obstetrics and Gynecology

## 2023-04-02 ENCOUNTER — Other Ambulatory Visit: Payer: Self-pay | Admitting: Family Medicine

## 2023-04-02 ENCOUNTER — Other Ambulatory Visit: Payer: Self-pay

## 2023-04-02 DIAGNOSIS — E782 Mixed hyperlipidemia: Secondary | ICD-10-CM

## 2023-04-02 MED ORDER — PRAVASTATIN SODIUM 40 MG PO TABS
40.0000 mg | ORAL_TABLET | Freq: Every day | ORAL | 0 refills | Status: DC
  Filled 2023-04-02: qty 30, 30d supply, fill #0

## 2023-04-10 ENCOUNTER — Ambulatory Visit: Payer: 59 | Attending: Nurse Practitioner | Admitting: Nurse Practitioner

## 2023-04-27 ENCOUNTER — Other Ambulatory Visit: Payer: Self-pay

## 2023-04-27 ENCOUNTER — Other Ambulatory Visit: Payer: Self-pay | Admitting: Family Medicine

## 2023-04-27 DIAGNOSIS — E782 Mixed hyperlipidemia: Secondary | ICD-10-CM

## 2023-04-27 MED ORDER — PRAVASTATIN SODIUM 40 MG PO TABS
40.0000 mg | ORAL_TABLET | Freq: Every day | ORAL | 0 refills | Status: DC
  Filled 2023-04-27 – 2023-05-07 (×2): qty 30, 30d supply, fill #0

## 2023-05-01 ENCOUNTER — Other Ambulatory Visit: Payer: Self-pay

## 2023-05-04 ENCOUNTER — Other Ambulatory Visit (HOSPITAL_COMMUNITY)
Admission: RE | Admit: 2023-05-04 | Discharge: 2023-05-04 | Disposition: A | Discharge status: Home / Self Care | Payer: 59 | Source: Ambulatory Visit | Attending: Obstetrics and Gynecology | Admitting: Obstetrics and Gynecology

## 2023-05-04 ENCOUNTER — Encounter: Payer: Self-pay | Admitting: Obstetrics & Gynecology

## 2023-05-04 ENCOUNTER — Ambulatory Visit: Payer: 59 | Admitting: Obstetrics & Gynecology

## 2023-05-04 VITALS — BP 123/80 | HR 76 | Ht 61.0 in | Wt 208.2 lb

## 2023-05-04 DIAGNOSIS — R1900 Intra-abdominal and pelvic swelling, mass and lump, unspecified site: Secondary | ICD-10-CM

## 2023-05-04 DIAGNOSIS — Z01419 Encounter for gynecological examination (general) (routine) without abnormal findings: Secondary | ICD-10-CM | POA: Diagnosis not present

## 2023-05-04 DIAGNOSIS — D259 Leiomyoma of uterus, unspecified: Secondary | ICD-10-CM | POA: Diagnosis not present

## 2023-05-04 NOTE — Progress Notes (Unsigned)
Patient presents for AEX. Last Pap: 10/04/19 normal Last Mammogram: 01/29/2023 Cycles/Contraception: Monthly periods, post tubal Nov 1999 Vaginal/Urinary Symptoms: pelvic pain during cycle STD Screen: Desires vaginal, declines blood work  Other Concerns: Wants to discuss surgical for fibroids

## 2023-05-04 NOTE — Progress Notes (Unsigned)
Patient ID: Latoya Juarez, female   DOB: [birth year], 52 y.o.   MRN: [id number]  Chief Complaint  Patient presents with   Gynecologic Exam    HPI Latoya Juarez is a 52 y.o. female.  [id number] Patient's last menstrual period was 04/21/2023. She has RUQ pain and a history of fibroid uterus. Previously a large pedunculated fibroid was seen on imaging and a recent pelvic ultrasound only showed some small uterine fibroids.Significant constipation and abdominal bloating. Normal colonoscopy.  HPI  Past Medical History:  Diagnosis Date   Anemia    Depression    Fibroid    Hypertension    Obesity    Prediabetes     Past Surgical History:  Procedure Laterality Date   CESAREAN SECTION     COLONOSCOPY  11/22/2021   TUBAL LIGATION  1999    Family History  Problem Relation Age of Onset   Diabetes Mother    Hypertension Mother    Stroke Mother    Hypertension Brother    Diabetes Brother    Stroke Brother    Heart attack Brother    Breast cancer Maternal Aunt    Colon cancer Neg Hx    Esophageal cancer Neg Hx    Rectal cancer Neg Hx    Stomach cancer Neg Hx     Social History Social History   Tobacco Use   Smoking status: Every Day    Current packs/day: 0.50    Average packs/day: 0.5 packs/day for 10.0 years (5.0 ttl pk-yrs)    Types: Cigarettes   Smokeless tobacco: Never  Vaping Use   Vaping status: Never Used  Substance Use Topics   Alcohol use: Yes    Comment: last drink Sept 2023; previously with heavy alcohol use   Drug use: Yes    Types: Marijuana    Allergies  Allergen Reactions   Hydroxyzine Other (See Comments)    Nightmares    Current Outpatient Medications  Medication Sig Dispense Refill   ferrous sulfate 325 (65 FE) MG EC tablet Take 325 mg by mouth 3 (three) times daily with meals.     fluticasone (FLONASE) 50 MCG/ACT nasal spray Place 2 sprays into both nostrils daily. 16 g 6   lisinopril-hydrochlorothiazide (ZESTORETIC) 10-12.5 MG tablet Take 1  tablet by mouth daily. 90 tablet 1   metFORMIN (GLUCOPHAGE) 500 MG tablet Take 1 tablet (500 mg total) by mouth daily with breakfast. 90 tablet 1   pravastatin (PRAVACHOL) 40 MG tablet Take 1 tablet (40 mg total) by mouth daily. 90 tablet 0   linaclotide (LINZESS) 72 MCG capsule Take 1-2 capsules (72-144 mcg total) by mouth daily before breakfast. (Patient not taking: Reported on 01/29/2023) 30 capsule 1   senna-docusate (SENOKOT-S) 8.6-50 MG tablet Take 2 tablets by mouth daily. (Patient not taking: Reported on 01/29/2023) 180 tablet 1   No current facility-administered medications for this visit.    Review of Systems Review of Systems  Constitutional: Negative.   Respiratory: Negative.    Cardiovascular: Negative.   Gastrointestinal:  Positive for abdominal distention, abdominal pain and constipation.  Genitourinary:  Positive for menstrual problem. Negative for pelvic pain and vaginal bleeding.    Blood pressure 123/80, pulse 76, height 5\' 1"  (1.549 m), weight 208 lb 3.2 oz (94.4 kg), last menstrual period 04/21/2023.  Physical Exam Physical Exam Vitals and nursing note reviewed. Exam conducted with a chaperone present.  Constitutional:      Appearance: Normal appearance. She is not ill-appearing.  Cardiovascular:  Rate and Rhythm: Normal rate.  Pulmonary:     Effort: Pulmonary effort is normal.  Abdominal:     Palpations: There is mass (RUQ 10 cm firm mass).  Genitourinary:    General: Normal vulva.     Exam position: Lithotomy position.     Vagina: Normal.     Cervix: Normal.     Uterus: Normal.      Adnexa: Right adnexa normal and left adnexa normal.  Skin:    General: Skin is warm.  Neurological:     General: No focal deficit present.     Mental Status: She is alert.  Psychiatric:        Mood and Affect: Mood normal.        Behavior: Behavior normal.     Data Reviewed Narrative & Impression  CLINICAL DATA:  Right lower quadrant abdominal pain.  Abnormal ultrasound.   EXAM: CT ABDOMEN AND PELVIS WITH CONTRAST   TECHNIQUE: Multidetector CT imaging of the abdomen and pelvis was performed using the standard protocol following bolus administration of intravenous contrast.   CONTRAST:  75mL OMNIPAQUE IOHEXOL 350 MG/ML SOLN   COMPARISON:  Right lower quadrant ultrasound 08/30/2021. Pelvic ultrasound 08/12/2021   FINDINGS: Lower chest: No acute abnormality   Hepatobiliary: Small layering gallstones within the gallbladder.   Pancreas: No focal abnormality or ductal dilatation.   Spleen: No focal abnormality.  Normal size.   Adrenals/Urinary Tract: Small cyst in the upper pole of the right kidney. No stones or hydronephrosis. Adrenal glands and urinary bladder unremarkable.   Stomach/Bowel: Stomach, large and small bowel grossly unremarkable. Normal appendix.   Vascular/Lymphatic: No evidence of aneurysm or adenopathy.   Reproductive: Numerous fibroids within the uterus, some calcified. There is a large mass superior to uterus measuring 13 x 12 x 11 cm. This appears to connect prior stalk to the uterine fundus compatible with exophytic fibroid. 3.3 cm left ovarian cyst. Right ovary unremarkable.   Other: No free fluid or free air.   Musculoskeletal: No acute bony abnormality.   IMPRESSION: 13 cm heterogeneous solid mass superior to the uterus, extending into the mid abdomen. This appears to connect with fundus of the uterus via a stalk best seen on sagittal image 65 compatible with pedunculated fibroid. Numerous other uterine fibroids, some calcified.   Cholelithiasis.   3.3 cm left ovarian cyst. No follow-up imaging recommended. Note: This recommendation does not apply to premenarchal patients and to those with increased risk (genetic, family history, elevated tumor markers or other high-risk factors) of ovarian cancer. Reference: JACR 2020 Feb; 17(2):248-254    Assessment Well woman exam with routine  gynecological exam - Plan: Cytology - PAP( [place]), Cervicovaginal ancillary only( [place])  Uterine leiomyoma, unspecified location - Plan: MR ABDOMEN WWO CONTRAST  Intra-abdominal and pelvic swelling, mass and lump, unspecified site - Plan: MR ABDOMEN WWO CONTRAST   Plan MRI to assess mass which may be a parasitic fibroid in the RUQ RTC 4 weeks to discuss surgical management   Scheryl Darter 05/04/2023, 3:54 PM

## 2023-05-05 ENCOUNTER — Telehealth: Payer: Self-pay | Admitting: Nurse Practitioner

## 2023-05-05 LAB — CERVICOVAGINAL ANCILLARY ONLY
Chlamydia: NEGATIVE
Comment: NEGATIVE
Comment: NORMAL
Neisseria Gonorrhea: NEGATIVE

## 2023-05-05 NOTE — Telephone Encounter (Signed)
I can not give a 90 day extension for light duty. The letter also does not have an expiration date.

## 2023-05-05 NOTE — Telephone Encounter (Signed)
Pt needs another note for a 90 extension for light duty at her job.  Pt saw the GYN yesterday, who has ordered another MRI.  Pt awaiting insurance approval.  But needs continued light duty at work.  Pt would like to ick up the letter when ready.

## 2023-05-06 LAB — CYTOLOGY - PAP
Adequacy: ABSENT
Comment: NEGATIVE
Diagnosis: NEGATIVE
High risk HPV: NEGATIVE

## 2023-05-06 NOTE — Telephone Encounter (Signed)
Unable to reach patient by phone to relay results.  Voicemail left to return call for response.   

## 2023-05-07 ENCOUNTER — Encounter: Payer: Self-pay | Admitting: Nurse Practitioner

## 2023-05-07 ENCOUNTER — Other Ambulatory Visit: Payer: Self-pay

## 2023-05-07 NOTE — Telephone Encounter (Signed)
Letter is in mychart.

## 2023-05-07 NOTE — Telephone Encounter (Signed)
Patient has come into the office stating that she does not need an extension for previous letter, but for the 15 lb restriction be lifted and for her to return to full duty at work. Patient states that her job did put her on light duty due to the weight restriction in the letter. Please advise.

## 2023-05-08 NOTE — Telephone Encounter (Signed)
Faxed to employer

## 2023-05-13 ENCOUNTER — Other Ambulatory Visit: Payer: Self-pay

## 2023-05-22 ENCOUNTER — Ambulatory Visit (HOSPITAL_BASED_OUTPATIENT_CLINIC_OR_DEPARTMENT_OTHER)
Admission: RE | Admit: 2023-05-22 | Discharge: 2023-05-22 | Disposition: A | Discharge status: Home / Self Care | Payer: 59 | Source: Ambulatory Visit | Attending: Obstetrics & Gynecology | Admitting: Obstetrics & Gynecology

## 2023-05-22 DIAGNOSIS — D259 Leiomyoma of uterus, unspecified: Secondary | ICD-10-CM | POA: Diagnosis not present

## 2023-05-22 DIAGNOSIS — R1900 Intra-abdominal and pelvic swelling, mass and lump, unspecified site: Secondary | ICD-10-CM

## 2023-05-22 MED ORDER — GADOBUTROL 1 MMOL/ML IV SOLN
9.4000 mL | Freq: Once | INTRAVENOUS | Status: AC | PRN
  Administered 2023-05-22: 9.4 mL via INTRAVENOUS
  Filled 2023-05-22: qty 10

## 2023-06-01 ENCOUNTER — Encounter: Payer: Self-pay | Admitting: Obstetrics & Gynecology

## 2023-06-01 ENCOUNTER — Ambulatory Visit (INDEPENDENT_AMBULATORY_CARE_PROVIDER_SITE_OTHER): Payer: 59 | Admitting: Obstetrics & Gynecology

## 2023-06-01 VITALS — BP 118/80 | HR 80 | Wt 207.0 lb

## 2023-06-01 DIAGNOSIS — D259 Leiomyoma of uterus, unspecified: Secondary | ICD-10-CM

## 2023-06-01 NOTE — Progress Notes (Signed)
Pt is in office for MRI results. Pt has no concerns today.

## 2023-06-01 NOTE — Progress Notes (Signed)
  Subjective:     Patient ID: Latoya Juarez, female   DOB: 05/01/1971, 52 y.o.   MRN: [id number] Cc abdominal mass HPI 52 y.o. [id number] Patient's last menstrual period was 05/21/2023 (approximate). She comes for f/u after MRI to assess large RUQ mass. Characterized as a pedunculated fibroid. No c/o pain. Has regular menses Past Medical History:  Diagnosis Date   Anemia    Depression    Fibroid    Hypertension    Obesity    Prediabetes    Past Surgical History:  Procedure Laterality Date   CESAREAN SECTION     COLONOSCOPY  11/22/2021   TUBAL LIGATION  1999     Review of Systems  Gastrointestinal:  Positive for abdominal distention.  Genitourinary:  Negative for pelvic pain and vaginal bleeding.       Objective:   Physical Exam Vitals and nursing note reviewed.  Constitutional:      Appearance: She is obese.  Pulmonary:     Effort: Pulmonary effort is normal.  Abdominal:     Palpations: There is mass (large firm mass in RUQ. Low transverse scar well healed).  Neurological:     Mental Status: She is alert.    Narrative & Impression CLINICAL DATA:  Abdominal mass, intra-abdominal neoplasm right upper quadrant pain x6 months   EXAM: MRI ABDOMEN WITHOUT AND WITH CONTRAST   TECHNIQUE: Multiplanar multisequence MR imaging of the abdomen was performed both before and after the administration of intravenous contrast.   CONTRAST:  9.46mL GADAVIST GADOBUTROL 1 MMOL/ML IV SOLN   COMPARISON:  Ultrasound January 12, 2023 and CT September 24, 2021   FINDINGS: Lower chest: No acute abnormality.   Hepatobiliary: No significant hepatic steatosis or iron deposition. No suspicious hepatic mass cholelithiasis without findings of acute cholecystitis. No biliary ductal dilation.   Pancreas: No pancreatic ductal dilation or evidence of acute inflammation   Spleen:  No splenomegaly.   Adrenals/Urinary Tract: Bilateral adrenal glands appear normal. Bilateral renal cysts are  considered benign and requiring no independent imaging follow-up   Stomach/Bowel: Visualized portions within the abdomen are unremarkable.   Vascular/Lymphatic: Normal caliber abdominal aorta. Linear band of hypointensity seen extending from the superior mesenteric vein through the portal vein on all postcontrast pulse sequences is suspicious for nonocclusive thrombus no pathologically enlarged abdominal lymph nodes.   Other: Enhancing large pedunculated uterine fibroid measures 18.4 x 13.1 cm on image 51/17 extending into the right upper quadrant unchanged from prior imaging.   Musculoskeletal: No suspicious bone lesions identified.   IMPRESSION: 1. Linear band of hypointensity seen extending from the superior mesenteric vein through the proximal main portal vein on all postcontrast pulse sequences is suspicious for nonocclusive thrombus. Recommend further evaluation with dedicated multiphase CTA abdomen and pelvis with IV contrast. 2. Enhancing large pedunculated uterine fibroid measures 18.4 x 13.1 cm extending into the right upper quadrant unchanged from prior imaging. 3. Cholelithiasis without findings of acute cholecystitis.     Electronically Signed   By: Maudry Mayhew M.D.   On: 05/23/2023 09:49      Assessment:     Large pedunculated fibroid in RUQ    Plan:     Surgical consult with Dr. Briscoe Deutscher requested

## 2023-06-16 ENCOUNTER — Other Ambulatory Visit: Payer: Self-pay

## 2023-06-16 ENCOUNTER — Ambulatory Visit: Payer: 59 | Attending: Nurse Practitioner | Admitting: Nurse Practitioner

## 2023-06-16 ENCOUNTER — Encounter: Payer: Self-pay | Admitting: Nurse Practitioner

## 2023-06-16 VITALS — BP 90/53 | HR 92 | Ht 61.0 in | Wt 203.6 lb

## 2023-06-16 DIAGNOSIS — E782 Mixed hyperlipidemia: Secondary | ICD-10-CM

## 2023-06-16 DIAGNOSIS — M25561 Pain in right knee: Secondary | ICD-10-CM

## 2023-06-16 DIAGNOSIS — R7303 Prediabetes: Secondary | ICD-10-CM

## 2023-06-16 DIAGNOSIS — I10 Essential (primary) hypertension: Secondary | ICD-10-CM | POA: Diagnosis not present

## 2023-06-16 DIAGNOSIS — F332 Major depressive disorder, recurrent severe without psychotic features: Secondary | ICD-10-CM | POA: Insufficient documentation

## 2023-06-16 DIAGNOSIS — M25562 Pain in left knee: Secondary | ICD-10-CM

## 2023-06-16 MED ORDER — DICLOFENAC SODIUM 1 % EX GEL
2.0000 g | Freq: Four times a day (QID) | CUTANEOUS | 1 refills | Status: DC
  Filled 2023-06-16: qty 200, 25d supply, fill #0
  Filled 2023-12-10 – 2023-12-14 (×2): qty 100, 13d supply, fill #1

## 2023-06-16 MED ORDER — PRAVASTATIN SODIUM 40 MG PO TABS
40.0000 mg | ORAL_TABLET | Freq: Every day | ORAL | 0 refills | Status: DC
  Filled 2023-06-16: qty 30, 30d supply, fill #0
  Filled 2023-07-31: qty 30, 30d supply, fill #1
  Filled 2023-09-03 – 2023-09-15 (×2): qty 30, 30d supply, fill #2
  Filled ????-??-??: fill #1

## 2023-06-16 MED ORDER — METFORMIN HCL 500 MG PO TABS
500.0000 mg | ORAL_TABLET | Freq: Every day | ORAL | 1 refills | Status: DC
  Filled 2023-06-16: qty 30, 30d supply, fill #0
  Filled 2023-07-31: qty 30, 30d supply, fill #1
  Filled 2023-09-03 – 2023-09-15 (×2): qty 30, 30d supply, fill #2
  Filled 2023-10-23: qty 30, 30d supply, fill #3
  Filled 2023-12-10: qty 30, 30d supply, fill #4
  Filled 2024-01-18: qty 30, 30d supply, fill #5
  Filled ????-??-??: fill #1

## 2023-06-16 MED ORDER — LISINOPRIL-HYDROCHLOROTHIAZIDE 10-12.5 MG PO TABS
1.0000 | ORAL_TABLET | Freq: Every day | ORAL | 1 refills | Status: DC
  Filled 2023-06-16: qty 30, 30d supply, fill #0
  Filled 2023-07-31: qty 30, 30d supply, fill #1
  Filled 2023-09-03 – 2023-09-15 (×2): qty 30, 30d supply, fill #2
  Filled 2023-10-23: qty 30, 30d supply, fill #3
  Filled 2023-12-10: qty 30, 30d supply, fill #4
  Filled 2024-01-18: qty 30, 30d supply, fill #5
  Filled ????-??-??: fill #1

## 2023-06-16 NOTE — Progress Notes (Signed)
Assessment & Plan:  [name] was seen today for hypertension.  Diagnoses and all orders for this visit:  Primary hypertension Submit BP readings over the next few days via mychart -     CMP14+EGFR -     lisinopril-hydrochlorothiazide (ZESTORETIC) 10-12.5 MG tablet; Take 1 tablet by mouth daily.  Prediabetes -     Hemoglobin A1c -     metFORMIN (GLUCOPHAGE) 500 MG tablet; Take 1 tablet (500 mg total) by mouth daily with breakfast. Continue blood sugar control as discussed in office today, low carbohydrate diet, and regular physical exercise as tolerated, 150 minutes per week (30 min each day, 5 days per week, or 50 min 3 days per week). Keep blood sugar logs with fasting goal of 90-130 mg/dl, post prandial (after you eat) less than 180.  For Hypoglycemia: BS <60 and Hyperglycemia BS >400; contact the clinic ASAP. Annual eye exams and foot exams are recommended.   Mixed hyperlipidemia -     pravastatin (PRAVACHOL) 40 MG tablet; Take 1 tablet (40 mg total) by mouth daily. INSTRUCTIONS: Work on a low fat, heart healthy diet and participate in regular aerobic exercise program by working out at least 150 minutes per week; 5 days a week-30 minutes per day. Avoid red meat/beef/steak,  fried foods. junk foods, sodas, sugary drinks, unhealthy snacking, alcohol and smoking.  Drink at least 80 oz of water per day and monitor your carbohydrate intake daily.    Acute pain of both knees -     diclofenac Sodium (VOLTAREN) 1 % GEL; Apply 2 g topically 4 (four) times daily. To both knees.    Patient has been counseled on age-appropriate routine health concerns for screening and prevention. These are reviewed and up-to-date. Referrals have been placed accordingly. Immunizations are up-to-date or declined.    Subjective:   Chief Complaint  Patient presents with   Hypertension   HPI Latoya Juarez 52 y.o. female presents to office today for follow up to HTN   She has a past medical history of  Anemia, Depression, Fibroid, Hypertension, Obesity, and Prediabetes.   She is being followed by GYN for large pedunculated fibroid in RUQ. Will need abdominal hysterectomy for this. She had several questions about the expectations of the surgery and is anxious about the procedure and outcomes.   HTN Blood pressure on the lower side of normal. She is currently taking zestoretic 10-12.5 mg daily. She does not monitor her blood pressure at home as she does not have a BP device. She does however have the means to monitor her blood pressure at work/  BP Readings from Last 3 Encounters:  06/16/23 (!) 90/53  06/01/23 118/80  05/04/23 123/80    Knee Pain: Patient presents with knee pain involving the  bilateral knees. Onset of the symptoms was several weeks ago. Inciting event: none known. Current symptoms include pain located in the lateral and medical meniscal areas . Pain is aggravated by any weight bearing and walking.  Patient has had no prior knee problems. Evaluation to date: none. Treatment to date: none.   Prediabetes Well controlled with diet only at this time.  Lab Results  Component Value Date   HGBA1C 5.7 (H) 01/07/2023     Review of Systems  Constitutional:  Negative for fever, malaise/fatigue and weight loss.  HENT: Negative.  Negative for nosebleeds.   Eyes: Negative.  Negative for blurred vision, double vision and photophobia.  Respiratory: Negative.  Negative for cough and shortness of breath.  Cardiovascular: Negative.  Negative for chest pain, palpitations and leg swelling.  Gastrointestinal: Negative.  Negative for heartburn, nausea and vomiting.  Musculoskeletal:  Positive for joint pain. Negative for myalgias.  Neurological: Negative.  Negative for dizziness, focal weakness, seizures and headaches.  Psychiatric/Behavioral: Negative.  Negative for suicidal ideas.     Past Medical History:  Diagnosis Date   Anemia    Depression    Fibroid    Hypertension    Obesity     Prediabetes     Past Surgical History:  Procedure Laterality Date   CESAREAN SECTION     COLONOSCOPY  11/22/2021   TUBAL LIGATION  1999    Family History  Problem Relation Age of Onset   Diabetes Mother    Hypertension Mother    Stroke Mother    Hypertension Brother    Diabetes Brother    Stroke Brother    Heart attack Brother    Breast cancer Maternal Aunt    Colon cancer Neg Hx    Esophageal cancer Neg Hx    Rectal cancer Neg Hx    Stomach cancer Neg Hx     Social History Reviewed with no changes to be made today.   Outpatient Medications Prior to Visit  Medication Sig Dispense Refill   ferrous sulfate 325 (65 FE) MG EC tablet Take 325 mg by mouth 3 (three) times daily with meals.     fluticasone (FLONASE) 50 MCG/ACT nasal spray Place 2 sprays into both nostrils daily. 16 g 6   lisinopril-hydrochlorothiazide (ZESTORETIC) 10-12.5 MG tablet Take 1 tablet by mouth daily. 90 tablet 1   metFORMIN (GLUCOPHAGE) 500 MG tablet Take 1 tablet (500 mg total) by mouth daily with breakfast. 90 tablet 1   pravastatin (PRAVACHOL) 40 MG tablet Take 1 tablet (40 mg total) by mouth daily. 90 tablet 0   linaclotide (LINZESS) 72 MCG capsule Take 1-2 capsules (72-144 mcg total) by mouth daily before breakfast. (Patient not taking: Reported on 01/29/2023) 30 capsule 1   senna-docusate (SENOKOT-S) 8.6-50 MG tablet Take 2 tablets by mouth daily. (Patient not taking: Reported on 01/29/2023) 180 tablet 1   No facility-administered medications prior to visit.    Allergies  Allergen Reactions   Hydroxyzine Other (See Comments)    Nightmares       Objective:    BP (!) 90/53 (BP Location: Left Arm, Patient Position: Sitting, Cuff Size: Normal)   Pulse 92   Ht 5\' 1"  (1.549 m)   Wt 203 lb 9.6 oz (92.4 kg)   LMP 06/05/2023 (Approximate)   SpO2 95%   BMI 38.47 kg/m  Wt Readings from Last 3 Encounters:  06/16/23 203 lb 9.6 oz (92.4 kg)  06/01/23 207 lb (93.9 kg)  05/04/23 208 lb 3.2 oz  (94.4 kg)    Physical Exam Vitals and nursing note reviewed.  Constitutional:      Appearance: She is well-developed.  HENT:     Head: Normocephalic and atraumatic.  Cardiovascular:     Rate and Rhythm: Normal rate and regular rhythm.     Heart sounds: Normal heart sounds. No murmur heard.    No gallop.  Pulmonary:     Effort: Pulmonary effort is normal. No tachypnea or respiratory distress.     Breath sounds: Normal breath sounds. No decreased breath sounds, wheezing, rhonchi or rales.  Chest:     Chest wall: No tenderness.  Abdominal:     General: Bowel sounds are normal.     Palpations: Abdomen  is soft.  Musculoskeletal:        General: Normal range of motion.     Cervical back: Normal range of motion.  Skin:    General: Skin is warm and dry.  Neurological:     Mental Status: She is alert and oriented to person, place, and time.     Coordination: Coordination normal.  Psychiatric:        Behavior: Behavior normal. Behavior is cooperative.        Thought Content: Thought content normal.        Judgment: Judgment normal.          Patient has been counseled extensively about nutrition and exercise as well as the importance of adherence with medications and regular follow-up. The patient was given clear instructions to go to ER or return to medical center if symptoms don't improve, worsen or new problems develop. The patient verbalized understanding.   Follow-up: Return in about 3 months (around 09/16/2023) for HTN.   Claiborne Rigg, FNP-BC [facility] [place], [place] [phone]   06/16/2023, 8:11 PM

## 2023-06-17 LAB — CMP14+EGFR
ALT: 13 IU/L (ref 0–32)
AST: 23 IU/L (ref 0–40)
Albumin: 4.2 g/dL (ref 3.8–4.9)
Alkaline Phosphatase: 70 IU/L (ref 44–121)
BUN/Creatinine Ratio: 14 (ref 9–23)
BUN: 19 mg/dL (ref 6–24)
Bilirubin Total: 0.6 mg/dL (ref 0.0–1.2)
CO2: 25 mmol/L (ref 20–29)
Calcium: 10.3 mg/dL — ABNORMAL HIGH (ref 8.7–10.2)
Chloride: 98 mmol/L (ref 96–106)
Creatinine, Ser: 1.35 mg/dL — ABNORMAL HIGH (ref 0.57–1.00)
Globulin, Total: 3.1 g/dL (ref 1.5–4.5)
Glucose: 135 mg/dL — ABNORMAL HIGH (ref 70–99)
Potassium: 4.1 mmol/L (ref 3.5–5.2)
Sodium: 140 mmol/L (ref 134–144)
Total Protein: 7.3 g/dL (ref 6.0–8.5)
eGFR: 48 mL/min/{1.73_m2} — ABNORMAL LOW (ref >59)

## 2023-06-17 LAB — HEMOGLOBIN A1C
Est. average glucose Bld gHb Est-mCnc: 114 mg/dL
Hgb A1c MFr Bld: 5.6 % (ref 4.8–5.6)

## 2023-06-19 ENCOUNTER — Ambulatory Visit: Payer: 59 | Admitting: Obstetrics & Gynecology

## 2023-06-23 ENCOUNTER — Other Ambulatory Visit: Payer: Self-pay

## 2023-07-02 ENCOUNTER — Ambulatory Visit (INDEPENDENT_AMBULATORY_CARE_PROVIDER_SITE_OTHER): Payer: 59 | Admitting: Obstetrics and Gynecology

## 2023-07-02 ENCOUNTER — Encounter: Payer: Self-pay | Admitting: Obstetrics and Gynecology

## 2023-07-02 VITALS — BP 119/71 | HR 81 | Wt 212.0 lb

## 2023-07-02 DIAGNOSIS — D259 Leiomyoma of uterus, unspecified: Secondary | ICD-10-CM | POA: Diagnosis not present

## 2023-07-02 NOTE — Progress Notes (Signed)
GYNECOLOGY VISIT  Patient name: Latoya Juarez MRN [id number]  Date of birth: [birth year] Chief Complaint:   Fibroids   History:  Latoya Juarez is a 52 y.o. [id number] being seen today for fibroid bulk symptoms.  Found to have cycle in 2018 due to having very heavy cycles. Fibroid wasn't causing problems at tha ttime and eventually cycles were more normal. In the last 7-8 months has become very uncomforablet and moved around upper abodmen. Will get really bloated during her cycle and chronic constipation.  Past Medical History:  Diagnosis Date   Anemia    Depression    Fibroid    Hypertension    Obesity    Prediabetes     Past Surgical History:  Procedure Laterality Date   CESAREAN SECTION     COLONOSCOPY  11/22/2021   TUBAL LIGATION  1999    The following portions of the patient's history were reviewed and updated as appropriate: allergies, current medications, past family history, past medical history, past social history, past surgical history and problem list.   Health Maintenance:   Last pap     Component Value Date/Time   DIAGPAP  05/04/2023 1549    - Negative for intraepithelial lesion or malignancy (NILM)   DIAGPAP  10/04/2019 0944    - Negative for intraepithelial lesion or malignancy (NILM)   HPVHIGH Negative 05/04/2023 1549   HPVHIGH Negative 10/04/2019 0944   ADEQPAP  05/04/2023 1549    Satisfactory for evaluation; transformation zone component ABSENT.   ADEQPAP  10/04/2019 0944    Satisfactory for evaluation; transformation zone component PRESENT.    High Risk HPV: Positive  Adequacy:  Satisfactory for evaluation, transformation zone component PRESENT  Diagnosis:  Atypical squamous cells of undetermined significance (ASC-US)  Last mammogram: n/a   Review of Systems:  Pertinent items are noted in HPI. Comprehensive review of systems was otherwise negative.   Objective:  Physical Exam BP 119/71   Pulse 81   Wt 212 lb 0.6 oz (96.2 kg)   LMP  06/05/2023 (Approximate)   BMI 40.06 kg/m    Physical Exam Vitals and nursing note reviewed.  Constitutional:      Appearance: Normal appearance.  HENT:     Head: Normocephalic and atraumatic.  Pulmonary:     Effort: Pulmonary effort is normal.  Abdominal:     Comments: Enlarged, mobile fibroid occupying right mid to upper quadrant  Skin:    General: Skin is warm and dry.  Neurological:     General: No focal deficit present.     Mental Status: She is alert.  Psychiatric:        Mood and Affect: Mood normal.        Behavior: Behavior normal.        Thought Content: Thought content normal.        Judgment: Judgment normal.      Labs and Imaging FINDINGS: Lower chest: No acute abnormality.   Hepatobiliary: No significant hepatic steatosis or iron deposition. No suspicious hepatic mass cholelithiasis without findings of acute cholecystitis. No biliary ductal dilation.   Pancreas: No pancreatic ductal dilation or evidence of acute inflammation   Spleen:  No splenomegaly.   Adrenals/Urinary Tract: Bilateral adrenal glands appear normal. Bilateral renal cysts are considered benign and requiring no independent imaging follow-up   Stomach/Bowel: Visualized portions within the abdomen are unremarkable.   Vascular/Lymphatic: Normal caliber abdominal aorta. Linear band of hypointensity seen extending from the superior mesenteric vein through the portal  vein on all postcontrast pulse sequences is suspicious for nonocclusive thrombus no pathologically enlarged abdominal lymph nodes.   Other: Enhancing large pedunculated uterine fibroid measures 18.4 x 13.1 cm on image 51/17 extending into the right upper quadrant unchanged from prior imaging.   Musculoskeletal: No suspicious bone lesions identified.   IMPRESSION: 1. Linear band of hypointensity seen extending from the superior mesenteric vein through the proximal main portal vein on all postcontrast pulse sequences is  suspicious for nonocclusive thrombus. Recommend further evaluation with dedicated multiphase CTA abdomen and pelvis with IV contrast. 2. Enhancing large pedunculated uterine fibroid measures 18.4 x 13.1 cm extending into the right upper quadrant unchanged from prior imaging. 3. Cholelithiasis without findings of acute cholecystitis.     Electronically Signed   By: Maudry Mayhew M.D.   On: 05/23/2023 09:49     Assessment & Plan:   1. Uterine leiomyoma, unspecified location Discussed myomectomy vs hysterectomy. Noted hx of c-section x 5 and bleeding not currently bothersome, mostly having bulk symntpmoms. Plan for myomectomy, reasonable given pedunculate nature of fibroid.   Patient desires surgical management with RA-MYO.  The risks of surgery were discussed in detail with the patient including but not limited to: bleeding which may require transfusion or reoperation; infection which may require prolonged hospitalization or re-hospitalization and antibiotic therapy; injury to bowel, bladder, ureters and major vessels or other surrounding organs which may lead to other procedures; formation of adhesions; need for additional procedures including laparotomy or subsequent procedures secondary to intraoperative injury or abnormal pathology; thromboembolic phenomenon; incisional problems and other postoperative or anesthesia complications.  Patient was told that the likelihood that her condition and symptoms will be treated effectively with this surgical management was high; the postoperative expectations were also discussed in detail. The patient also understands the alternative treatment options which were discussed in full. All questions were answered.  She was told that she will be contacted by our surgical scheduler regarding the time and date of her surgery; routine preoperative instructions will be given to her by the preoperative nursing team.    Noted that she may still have bleeding  abnormalities in the future and that removal of pedunculated fibroid will not address this. Noted risk of fibroid regrowth though likelihood lower given perimenopausal age range.   Printed patient education handouts about the procedure were given to the patient to review at home.     Routine preventative health maintenance measures emphasized.  Lorriane Shire, MD [facility], [facility]

## 2023-07-31 ENCOUNTER — Other Ambulatory Visit: Payer: Self-pay

## 2023-08-25 ENCOUNTER — Telehealth: Payer: Self-pay

## 2023-08-25 NOTE — Telephone Encounter (Signed)
-----   Message from Lorriane Shire sent at 07/02/2023  5:13 PM EDT ----- Regarding: schedule Hello,  I would like to schedule for robotic myomecotmy, possible open for subserosal fibroid, with assist, extended recovery.  Thank you,  Ajewole

## 2023-08-25 NOTE — Telephone Encounter (Signed)
Called patient back to advise that I was unable to secure the OR on 09/23/23. The procedure she's having will take 5 hours and we only had 3 hours available. Advised,I will continue searching for available OR time and give her a call.

## 2023-08-25 NOTE — Telephone Encounter (Signed)
Called patient to see if she was available for surgery with Dr. Briscoe Deutscher on 09/23/23. Patient agree's to make herself available. I let the patient know this day is not guaranteed. I'm requesting the time in the Or but wanted to ensure she was available. Will update patient later today.

## 2023-08-27 ENCOUNTER — Ambulatory Visit: Payer: Self-pay | Admitting: *Deleted

## 2023-08-27 NOTE — Telephone Encounter (Signed)
  Chief Complaint: abdominal pain requesting medication Symptoms: abdominal pain cramping, comes and goes x 1-2 weeks. Top to middle abdomen. Nausea, constipation LBM today . Reports will have surgery in Dec. For fibroids. Can tolerate fluids but eating causes pain  Frequency: 1-2 weeks  Pertinent Negatives: Patient denies fever no vomiting Disposition: [] ED /[] Urgent Care (no appt availability in office) / [] Appointment(In office/virtual)/ []  [facility]/ [] Home Care/ [x] Refused Recommended Disposition /[] [place] [facility]/ []  Follow-up with PCP Additional Notes:   No available appt until Nov 21. Offered mobile bus . Patient requesting pain medication until her surgery in Dec. Please advise. Patient would like call back.       Reason for Disposition  [1] MODERATE pain (e.g., interferes with normal activities) AND [2] pain comes and goes (cramps) AND [3] present > 24 hours  (Exception: Pain with Vomiting or Diarrhea - see that Guideline.)  Answer Assessment - Initial Assessment Questions 1. LOCATION: "Where does it hurt?"      Top and middle abdomen 2. RADIATION: "Does the pain shoot anywhere else?" (e.g., chest, back)     Upper and middle abdomen  3. ONSET: "When did the pain begin?" (e.g., minutes, hours or days ago)      1 week  4. SUDDEN: "Gradual or sudden onset?"     Na  5. PATTERN "Does the pain come and go, or is it constant?"    - If it comes and goes: "How long does it last?" "Do you have pain now?"     (Note: Comes and goes means the pain is intermittent. It goes away completely between bouts.)    - If constant: "Is it getting better, staying the same, or getting worse?"      (Note: Constant means the pain never goes away completely; most serious pain is constant and gets worse.)      Comes and goes  6. SEVERITY: "How bad is the pain?"  (e.g., Scale 1-10; mild, moderate, or severe)    - MILD (1-3): Doesn't interfere with normal activities, abdomen soft and  not tender to touch.     - MODERATE (4-7): Interferes with normal activities or awakens from sleep, abdomen tender to touch.     - SEVERE (8-10): Excruciating pain, doubled over, unable to do any normal activities.       Cramping  7. RECURRENT SYMPTOM: "Have you ever had this type of stomach pain before?" If Yes, ask: "When was the last time?" and "What happened that time?"      Yes , fibroids 8. CAUSE: "What do you think is causing the stomach pain?"     Will have surgery in Dec. For fibroids per patient  9. RELIEVING/AGGRAVATING FACTORS: "What makes it better or worse?" (e.g., antacids, bending or twisting motion, bowel movement)     Na  10. OTHER SYMPTOMS: "Do you have any other symptoms?" (e.g., back pain, diarrhea, fever, urination pain, vomiting)       Abdominal pain comes/ goes. Can drink fluids but not able to eat due to pain 11. PREGNANCY: "Is there any chance you are pregnant?" "When was your last menstrual period?"       na  Protocols used: Abdominal Pain - [facility]

## 2023-08-27 NOTE — Telephone Encounter (Signed)
Spoke with patient. Patient voiced that she was having right side pain and would like something for pain. Voiced that she is schedule to have abdominal hysterectomy. Advised that she contact her OBGYN or surgeon the let them know she is having pain . Patient voiced that she called to let them know she was having pain but did not ask for any medication for pain. Advised that she contact them and ask what should and she take or if they can prescribe her something. Patient voiced that she would. Advised patient to reach out to [place] if needed.

## 2023-09-01 ENCOUNTER — Telehealth: Payer: Self-pay

## 2023-09-01 NOTE — Telephone Encounter (Signed)
Called patient to advise Dr. Briscoe Deutscher next available date for surgery is 10/26/22 @[facility]  at 7:30 am. Patient agreed to surgery date and confirmed she has two medical insurances. [employer] & [employer] verified in registration. Pre-op instructions and surgery details were provided by phone. Patient asked that I also send surgery details via Mychart.

## 2023-09-01 NOTE — Telephone Encounter (Signed)
-----   Message from Lorriane Shire sent at 07/02/2023  5:13 PM EDT ----- Regarding: schedule Hello,  I would like to schedule for robotic myomecotmy, possible open for subserosal fibroid, with assist, extended recovery.  Thank you,  Ajewole

## 2023-09-08 ENCOUNTER — Telehealth: Payer: Self-pay

## 2023-09-08 NOTE — Telephone Encounter (Signed)
Confirmed patient is scheduled for surgery on 10/26/22 w/ Dr. Briscoe Deutscher and surgery details were sent to patients Mychart acct on 09/03/23.

## 2023-09-15 ENCOUNTER — Other Ambulatory Visit: Payer: Self-pay

## 2023-09-23 ENCOUNTER — Ambulatory Visit: Admit: 2023-09-23 | Payer: 59 | Admitting: Obstetrics and Gynecology

## 2023-09-23 SURGERY — MYOMECTOMY, ROBOT-ASSISTED
Anesthesia: Choice

## 2023-10-16 ENCOUNTER — Encounter (HOSPITAL_BASED_OUTPATIENT_CLINIC_OR_DEPARTMENT_OTHER): Payer: Self-pay | Admitting: Obstetrics and Gynecology

## 2023-10-16 NOTE — Progress Notes (Addendum)
Your procedure is scheduled on :  Tuesday,  11-06-2023  Report to [facility] AT  __5:30_ AM.   Call this number if you have problems the morning of surgery  :[phone]. Any questions prior to surgery call pre-op nurse, Anubis Fundora :  [phone]   OUR ADDRESS IS [address].  WE ARE LOCATED IN THE [facility]  PLEASE BRING YOUR INSURANCE CARD AND PHOTO ID DAY OF SURGERY.                                     REMEMBER: Do not eat food after midnight night before surgery.  You may have clear liquid diet from midnight night before surgery until 4:30 AM.  NO clear liquids after 4:30 AM day of surgery.  This includes no water,  candy/  gum/  mints.   Please brush your teeth morning of surgery and rinse mouth out.   CLEAR LIQUID DIET Allowed      Water                                                                   Coffee and tea, regular and decaf  (NO cream or milk products of any type, may sweeten)                         Carbonated beverages, regular and diet                                    Sports drinks like Gatorade _____________________________________________________________________     TAKE ONLY THESE MEDICATIONS MORNING OF SURGERY: If taking after 4:30 AM may take with sip of water Pravastatin   Remember:  DO NOT Take the following morning of surgery:   metformin  lisinopril-hydrochlorothiazide                                        DO NOT WEAR JEWERLY/  METAL/  PIERCINGS (INCLUDING NO PLASTIC PIERCINGS) DO NOT WEAR LOTIONS, POWDERS, PERFUMES OR NAIL POLISH ON YOUR FINGERNAILS. TOENAIL POLISH IS OK TO WEAR. DO NOT SHAVE FOR 48 HOURS PRIOR TO DAY OF SURGERY.  CONTACTS, GLASSES, OR DENTURES MAY NOT BE WORN TO SURGERY.  REMEMBER: NO SMOKING, VAPING ,  DRUGS OR ALCOHOL FOR 24 HOURS BEFORE YOUR SURGERY.                                    [facility] IS NOT RESPONSIBLE  FOR ANY BELONGINGS.                                                                     [facility]  [facility] - Preparing for Surgery Before surgery, you can play an important role.  Because skin is not sterile, your skin needs to be as free of germs as possible.  You can reduce the number of germs on your skin by washing with CHG (chlorahexidine gluconate) soap before surgery.  CHG is an antiseptic cleaner which kills germs and bonds with the skin to continue killing germs even after washing. Please DO NOT use if you have an allergy to CHG or antibacterial soaps.  If your skin becomes reddened/irritated stop using the CHG and inform your nurse when you arrive at Short Stay. Do not shave (including legs and underarms) for at least 48 hours prior to the first CHG shower.  You may shave your face/neck. Please follow these instructions carefully:  1.  Shower with CHG Soap the night before surgery and the  morning of Surgery.  2.  If you choose to wash your hair, wash your hair first as usual with your  normal  shampoo.  3.  After you shampoo, rinse your hair and body thoroughly to remove the  shampoo.                                        4.  Use CHG as you would any other liquid soap.  You can apply chg directly  to the skin and wash , chg soap provided, night before and morning of your surgery.  5.  Apply the CHG Soap to your body ONLY FROM THE NECK DOWN.   Do not use on face/ open                           Wound or open sores. Avoid contact with eyes, ears mouth and genitals (private parts).                       Wash face,  Genitals (private parts) with your normal soap.             6.  Wash thoroughly, paying special attention to the area where your surgery  will be performed.  7.  Thoroughly rinse your body with warm water from the neck down.  8.  DO NOT shower/wash with your normal soap after using and rinsing off  the CHG Soap.             9.  Pat yourself dry with a clean towel.            10.  Wear clean pajamas.            11.  Place  clean sheets on your bed the night of your first shower and do not  sleep with pets. Day of Surgery : Do not apply any lotions/ powders the morning of surgery.  Please wear clean clothes to the hospital/surgery center.  IF YOU HAVE ANY SKIN IRRITATION OR PROBLEMS WITH THE SURGICAL SOAP, PLEASE GET A BAR OF GOLD DIAL SOAP AND SHOWER THE NIGHT BEFORE YOUR SURGERY AND THE MORNING OF YOUR SURGERY. PLEASE LET THE NURSE KNOW MORNING OF YOUR SURGERY IF YOU HAD ANY PROBLEMS WITH THE SURGICAL SOAP.   YOUR SURGEON MAY HAVE REQUESTED EXTENDED RECOVERY TIME AFTER YOUR SURGERY. IT COULD BE A  JUST A FEW HOURS  UP TO AN OVERNIGHT STAY.  YOUR SURGEON SHOULD HAVE DISCUSSED  THIS WITH YOU PRIOR TO YOUR SURGERY. IN THE EVENT YOU NEED TO STAY OVERNIGHT PLEASE REFER TO THE FOLLOWING GUIDELINES. YOU MAY HAVE UP TO 4 VISITORS  MAY VISIT IN THE EXTENDED RECOVERY ROOM UNTIL 800 PM ONLY.  ONE  VISITOR AGE 52 AND OVER MAY SPEND THE NIGHT AND MUST BE IN EXTENDED RECOVERY ROOM NO LATER THAN 800 PM . YOUR DISCHARGE TIME AFTER YOU SPEND THE NIGHT IS 900 AM THE MORNING AFTER YOUR SURGERY. YOU MAY PACK A SMALL OVERNIGHT BAG WITH TOILETRIES FOR YOUR OVERNIGHT STAY IF YOU WISH.  REGARDLESS OF IF YOU STAY OVER NIGHT OR ARE DISCHARGED THE SAME DAY YOU WILL BE REQUIRED TO HAVE A RESPONSIBLE ADULT (18 YRS OLD OR OLDER) STAY WITH YOU FOR AT LEAST THE FIRST 6 HOURS WHEN HOME.  YOUR PRESCRIPTION MEDICATIONS WILL BE PROVIDED DURING [facility] STAY.  ________________________________________________________________________

## 2023-10-16 NOTE — Progress Notes (Signed)
Spoke w/ via phone for pre-op interview--- pt Lab needs dos----  urine preg       Lab results------ lap appt 10-22-2023 @ 1100 getting CBC/ BMP/ T&S/ EKG COVID test -----patient states asymptomatic no test needed Arrive at ------- 0530 on 10-26-2022 NPO after MN NO Solid Food.  Clear liquids from MN until--- 0430 Med rec completed Medications to take morning of surgery ----- pravastatin Diabetic medication ----- do not take metformin morning of surgery Patient instructed no nail polish to be worn day of surgery Patient instructed to bring photo id and insurance card day of surgery Patient aware to have Driver (ride ) / caregiver    for 24 hours after surgery - sig other, darryl Patient Special Instructions ----- will pick up bag w/ hibiclens and written instructions at lab appt.  Asked to call with any questions Pre-Op special Instructions ----- n/a Patient verbalized understanding of instructions that were given at this phone interview. Patient denies chest pain, sob, fever, cough at the interview.

## 2023-10-22 ENCOUNTER — Encounter (HOSPITAL_COMMUNITY)
Admission: RE | Admit: 2023-10-22 | Discharge: 2023-10-22 | Disposition: A | Discharge status: Home / Self Care | Payer: 59 | Source: Ambulatory Visit | Attending: Obstetrics and Gynecology | Admitting: Obstetrics and Gynecology

## 2023-10-22 DIAGNOSIS — D259 Leiomyoma of uterus, unspecified: Secondary | ICD-10-CM | POA: Insufficient documentation

## 2023-10-22 DIAGNOSIS — Z01818 Encounter for other preprocedural examination: Secondary | ICD-10-CM | POA: Diagnosis present

## 2023-10-22 LAB — BASIC METABOLIC PANEL
Anion gap: 8 (ref 5–15)
BUN: 13 mg/dL (ref 6–20)
CO2: 21 mmol/L — ABNORMAL LOW (ref 22–32)
Calcium: 9.6 mg/dL (ref 8.9–10.3)
Chloride: 105 mmol/L (ref 98–111)
Creatinine, Ser: 0.88 mg/dL (ref 0.44–1.00)
GFR, Estimated: >60 mL/min (ref >60)
Glucose, Bld: 103 mg/dL — ABNORMAL HIGH (ref 70–99)
Potassium: 3.7 mmol/L (ref 3.5–5.1)
Sodium: 134 mmol/L — ABNORMAL LOW (ref 135–145)

## 2023-10-22 LAB — CBC
HCT: 41 % (ref 36.0–46.0)
Hemoglobin: 13.6 g/dL (ref 12.0–15.0)
MCH: 31.8 pg (ref 26.0–34.0)
MCHC: 33.2 g/dL (ref 30.0–36.0)
MCV: 95.8 fL (ref 80.0–100.0)
Platelets: 378 10*3/uL (ref 150–400)
RBC: 4.28 MIL/uL (ref 3.87–5.11)
RDW: 13.2 % (ref 11.5–15.5)
WBC: 8.5 10*3/uL (ref 4.0–10.5)
nRBC: 0 % (ref 0.0–0.2)

## 2023-10-23 ENCOUNTER — Other Ambulatory Visit: Payer: Self-pay

## 2023-10-23 ENCOUNTER — Other Ambulatory Visit: Payer: Self-pay | Admitting: Nurse Practitioner

## 2023-10-23 DIAGNOSIS — E782 Mixed hyperlipidemia: Secondary | ICD-10-CM

## 2023-10-23 MED ORDER — PRAVASTATIN SODIUM 40 MG PO TABS
40.0000 mg | ORAL_TABLET | Freq: Every day | ORAL | 0 refills | Status: DC
  Filled 2023-10-23: qty 30, 30d supply, fill #0
  Filled 2023-12-10: qty 30, 30d supply, fill #1
  Filled 2024-01-18: qty 30, 30d supply, fill #2

## 2023-10-26 ENCOUNTER — Other Ambulatory Visit: Payer: Self-pay

## 2023-10-26 NOTE — Anesthesia Preprocedure Evaluation (Addendum)
 Anesthesia Evaluation  Patient identified by MRN, date of birth, ID band Patient awake    Reviewed: Allergy & Precautions, H&P , NPO status , Patient's Chart, lab work & pertinent test results  Airway Mallampati: II  TM Distance: >3 FB Neck ROM: Full    Dental no notable dental hx. (+) Teeth Intact, Dental Advisory Given   Pulmonary Current Smoker   Pulmonary exam normal breath sounds clear to auscultation       Cardiovascular Exercise Tolerance: Good hypertension, Pt. on medications  Rhythm:Regular Rate:Normal     Neuro/Psych    Depression    negative neurological ROS     GI/Hepatic negative GI ROS, Neg liver ROS,,,  Endo/Other    Class 3 obesity  Renal/GU negative Renal ROS  negative genitourinary   Musculoskeletal  (+) Arthritis , Osteoarthritis,    Abdominal   Peds  Hematology  (+) Blood dyscrasia, anemia   Anesthesia Other Findings   Reproductive/Obstetrics negative OB ROS                             Anesthesia Physical Anesthesia Plan  ASA: 3  Anesthesia Plan: General   Post-op Pain Management: Regional block*, Tylenol  PO (pre-op)* and Toradol  IV (intra-op)*   Induction: Intravenous  PONV Risk Score and Plan: 3 and Ondansetron , Dexamethasone  and Midazolam   Airway Management Planned: Oral ETT  Additional Equipment:   Intra-op Plan:   Post-operative Plan: Extubation in OR  Informed Consent: I have reviewed the patients History and Physical, chart, labs and discussed the procedure including the risks, benefits and alternatives for the proposed anesthesia with the patient or authorized representative who has indicated his/her understanding and acceptance.     Dental advisory given  Plan Discussed with: CRNA  Anesthesia Plan Comments:        Anesthesia Quick Evaluation

## 2023-10-27 ENCOUNTER — Encounter (HOSPITAL_BASED_OUTPATIENT_CLINIC_OR_DEPARTMENT_OTHER): Payer: Self-pay | Admitting: Obstetrics and Gynecology

## 2023-10-27 ENCOUNTER — Ambulatory Visit (HOSPITAL_BASED_OUTPATIENT_CLINIC_OR_DEPARTMENT_OTHER): Payer: BC Managed Care – PPO | Admitting: Anesthesiology

## 2023-10-27 ENCOUNTER — Other Ambulatory Visit: Payer: Self-pay

## 2023-10-27 ENCOUNTER — Ambulatory Visit (HOSPITAL_BASED_OUTPATIENT_CLINIC_OR_DEPARTMENT_OTHER)
Admission: RE | Admit: 2023-10-27 | Discharge: 2023-10-28 | Disposition: A | Discharge status: Home / Self Care | Payer: BC Managed Care – PPO | Attending: Obstetrics and Gynecology | Admitting: Obstetrics and Gynecology

## 2023-10-27 ENCOUNTER — Encounter (HOSPITAL_BASED_OUTPATIENT_CLINIC_OR_DEPARTMENT_OTHER)
Admission: RE | Disposition: A | Discharge status: Home / Self Care | Payer: Self-pay | Source: Home / Self Care | Attending: Obstetrics and Gynecology

## 2023-10-27 DIAGNOSIS — D259 Leiomyoma of uterus, unspecified: Secondary | ICD-10-CM

## 2023-10-27 DIAGNOSIS — Z8249 Family history of ischemic heart disease and other diseases of the circulatory system: Secondary | ICD-10-CM | POA: Diagnosis not present

## 2023-10-27 DIAGNOSIS — I10 Essential (primary) hypertension: Secondary | ICD-10-CM | POA: Insufficient documentation

## 2023-10-27 DIAGNOSIS — E785 Hyperlipidemia, unspecified: Secondary | ICD-10-CM | POA: Diagnosis not present

## 2023-10-27 DIAGNOSIS — D251 Intramural leiomyoma of uterus: Secondary | ICD-10-CM | POA: Diagnosis not present

## 2023-10-27 DIAGNOSIS — Z01818 Encounter for other preprocedural examination: Secondary | ICD-10-CM

## 2023-10-27 DIAGNOSIS — D219 Benign neoplasm of connective and other soft tissue, unspecified: Secondary | ICD-10-CM | POA: Diagnosis present

## 2023-10-27 DIAGNOSIS — F17200 Nicotine dependence, unspecified, uncomplicated: Secondary | ICD-10-CM | POA: Diagnosis not present

## 2023-10-27 HISTORY — DX: Mixed hyperlipidemia: E78.2

## 2023-10-27 HISTORY — DX: Iron deficiency anemia, unspecified: D50.9

## 2023-10-27 HISTORY — DX: Unspecified hemorrhoids: K64.9

## 2023-10-27 HISTORY — PX: ROBOT ASSISTED MYOMECTOMY: SHX5142

## 2023-10-27 HISTORY — DX: Leiomyoma of uterus, unspecified: D25.9

## 2023-10-27 HISTORY — DX: Other constipation: K59.09

## 2023-10-27 HISTORY — DX: Unspecified osteoarthritis, unspecified site: M19.90

## 2023-10-27 LAB — ABO/RH: ABO/RH(D): B POS

## 2023-10-27 LAB — TYPE AND SCREEN
ABO/RH(D): B POS
Antibody Screen: NEGATIVE

## 2023-10-27 LAB — POCT PREGNANCY, URINE: Preg Test, Ur: NEGATIVE

## 2023-10-27 SURGERY — MYOMECTOMY, ROBOT-ASSISTED
Anesthesia: General

## 2023-10-27 MED ORDER — DEXAMETHASONE SODIUM PHOSPHATE 4 MG/ML IJ SOLN
INTRAMUSCULAR | Status: DC | PRN
  Administered 2023-10-27: 10 mg via INTRAVENOUS

## 2023-10-27 MED ORDER — OXYCODONE HCL 5 MG PO TABS
5.0000 mg | ORAL_TABLET | ORAL | Status: DC | PRN
  Administered 2023-10-27 (×3): 5 mg via ORAL
  Administered 2023-10-28 (×2): 10 mg via ORAL

## 2023-10-27 MED ORDER — ACETAMINOPHEN 500 MG PO TABS
ORAL_TABLET | ORAL | Status: AC
  Filled 2023-10-27: qty 2

## 2023-10-27 MED ORDER — OXYCODONE HCL 5 MG PO TABS
5.0000 mg | ORAL_TABLET | ORAL | 0 refills | Status: DC | PRN
  Filled 2023-10-27: qty 20, 4d supply, fill #0

## 2023-10-27 MED ORDER — FENTANYL CITRATE (PF) 100 MCG/2ML IJ SOLN
INTRAMUSCULAR | Status: DC | PRN
  Administered 2023-10-27 (×4): 50 ug via INTRAVENOUS

## 2023-10-27 MED ORDER — LIDOCAINE HCL (PF) 2 % IJ SOLN
INTRAMUSCULAR | Status: AC
  Filled 2023-10-27: qty 5

## 2023-10-27 MED ORDER — FENTANYL CITRATE (PF) 100 MCG/2ML IJ SOLN
INTRAMUSCULAR | Status: AC
  Filled 2023-10-27: qty 2

## 2023-10-27 MED ORDER — HYDROMORPHONE HCL 2 MG/ML IJ SOLN
INTRAMUSCULAR | Status: AC
  Filled 2023-10-27: qty 1

## 2023-10-27 MED ORDER — HYDROMORPHONE HCL 1 MG/ML IJ SOLN
0.2500 mg | INTRAMUSCULAR | Status: DC | PRN
  Administered 2023-10-27 (×2): 0.25 mg via INTRAVENOUS

## 2023-10-27 MED ORDER — ACETAMINOPHEN 500 MG PO TABS
1000.0000 mg | ORAL_TABLET | Freq: Four times a day (QID) | ORAL | 1 refills | Status: AC | PRN
  Filled 2023-10-27: qty 120, 15d supply, fill #0
  Filled 2024-03-29: qty 120, 15d supply, fill #1

## 2023-10-27 MED ORDER — SIMETHICONE 80 MG PO CHEW
CHEWABLE_TABLET | ORAL | Status: AC
  Filled 2023-10-27: qty 1

## 2023-10-27 MED ORDER — ACETAMINOPHEN 500 MG PO TABS
1000.0000 mg | ORAL_TABLET | Freq: Four times a day (QID) | ORAL | Status: DC
  Administered 2023-10-27 – 2023-10-28 (×3): 1000 mg via ORAL

## 2023-10-27 MED ORDER — GABAPENTIN 300 MG PO CAPS
300.0000 mg | ORAL_CAPSULE | ORAL | Status: AC
Start: 2023-10-27 — End: 2023-10-27
  Administered 2023-10-27: 300 mg via ORAL

## 2023-10-27 MED ORDER — SUGAMMADEX SODIUM 200 MG/2ML IV SOLN
INTRAVENOUS | Status: DC | PRN
  Administered 2023-10-27: 200 mg via INTRAVENOUS

## 2023-10-27 MED ORDER — BUPIVACAINE-EPINEPHRINE (PF) 0.5% -1:200000 IJ SOLN
INTRAMUSCULAR | Status: DC | PRN
  Administered 2023-10-27 (×2): 20 mL

## 2023-10-27 MED ORDER — PHENYLEPHRINE HCL (PRESSORS) 10 MG/ML IV SOLN
INTRAVENOUS | Status: DC | PRN
  Administered 2023-10-27: 80 ug via INTRAVENOUS

## 2023-10-27 MED ORDER — ROCURONIUM BROMIDE 10 MG/ML (PF) SYRINGE
PREFILLED_SYRINGE | INTRAVENOUS | Status: AC
  Filled 2023-10-27: qty 10

## 2023-10-27 MED ORDER — MIDAZOLAM HCL 2 MG/2ML IJ SOLN
INTRAMUSCULAR | Status: AC
Start: 2023-10-27 — End: ?
  Filled 2023-10-27: qty 2

## 2023-10-27 MED ORDER — IBUPROFEN 200 MG PO TABS: 600.0000 mg | ORAL_TABLET | Freq: Four times a day (QID) | ORAL | Status: DC

## 2023-10-27 MED ORDER — SODIUM CHLORIDE 0.9 % IR SOLN
Status: DC | PRN
  Administered 2023-10-27: 1000 mL

## 2023-10-27 MED ORDER — EPHEDRINE 5 MG/ML INJ
INTRAVENOUS | Status: AC
  Filled 2023-10-27: qty 5

## 2023-10-27 MED ORDER — SIMETHICONE 80 MG PO CHEW
80.0000 mg | CHEWABLE_TABLET | Freq: Four times a day (QID) | ORAL | Status: DC | PRN
  Administered 2023-10-27 – 2023-10-28 (×3): 80 mg via ORAL

## 2023-10-27 MED ORDER — HYDROCHLOROTHIAZIDE 12.5 MG PO TABS
12.5000 mg | ORAL_TABLET | Freq: Every day | ORAL | Status: DC
  Filled 2023-10-27: qty 1

## 2023-10-27 MED ORDER — OXYCODONE HCL 5 MG PO TABS
ORAL_TABLET | ORAL | Status: AC
  Filled 2023-10-27: qty 1

## 2023-10-27 MED ORDER — LIDOCAINE HCL (CARDIAC) PF 100 MG/5ML IV SOSY
PREFILLED_SYRINGE | INTRAVENOUS | Status: DC | PRN
  Administered 2023-10-27: 60 mg via INTRAVENOUS

## 2023-10-27 MED ORDER — ACETAMINOPHEN 500 MG PO TABS
1000.0000 mg | ORAL_TABLET | ORAL | Status: AC
  Administered 2023-10-27: 1000 mg via ORAL

## 2023-10-27 MED ORDER — PHENYLEPHRINE 80 MCG/ML (10ML) SYRINGE FOR IV PUSH (FOR BLOOD PRESSURE SUPPORT)
PREFILLED_SYRINGE | INTRAVENOUS | Status: AC
  Filled 2023-10-27: qty 10

## 2023-10-27 MED ORDER — KETOROLAC TROMETHAMINE 30 MG/ML IJ SOLN
INTRAMUSCULAR | Status: DC | PRN
  Administered 2023-10-27: 30 mg via INTRAVENOUS

## 2023-10-27 MED ORDER — KETOROLAC TROMETHAMINE 30 MG/ML IJ SOLN: 30.0000 mg | Freq: Four times a day (QID) | INTRAMUSCULAR | Status: DC

## 2023-10-27 MED ORDER — PROPOFOL 10 MG/ML IV BOLUS
INTRAVENOUS | Status: DC | PRN
  Administered 2023-10-27: 60 mg via INTRAVENOUS
  Administered 2023-10-27: 140 mg via INTRAVENOUS

## 2023-10-27 MED ORDER — LACTATED RINGERS IV SOLN: INTRAVENOUS | Status: DC

## 2023-10-27 MED ORDER — POVIDONE-IODINE 10 % EX SWAB: 2.0000 | Freq: Once | CUTANEOUS | Status: DC

## 2023-10-27 MED ORDER — PROPOFOL 10 MG/ML IV BOLUS
INTRAVENOUS | Status: AC
  Filled 2023-10-27: qty 20

## 2023-10-27 MED ORDER — METFORMIN HCL 500 MG PO TABS
500.0000 mg | ORAL_TABLET | Freq: Once | ORAL | Status: AC
Start: 2023-10-28 — End: 2023-10-28
  Administered 2023-10-28: 500 mg via ORAL
  Filled 2023-10-27: qty 1

## 2023-10-27 MED ORDER — ONDANSETRON HCL 4 MG/2ML IJ SOLN
INTRAMUSCULAR | Status: AC
  Filled 2023-10-27: qty 2

## 2023-10-27 MED ORDER — ROCURONIUM BROMIDE 100 MG/10ML IV SOLN
INTRAVENOUS | Status: DC | PRN
  Administered 2023-10-27: 10 mg via INTRAVENOUS
  Administered 2023-10-27: 20 mg via INTRAVENOUS
  Administered 2023-10-27: 70 mg via INTRAVENOUS
  Administered 2023-10-27: 10 mg via INTRAVENOUS

## 2023-10-27 MED ORDER — OXYCODONE HCL 5 MG PO TABS
ORAL_TABLET | ORAL | Status: AC
  Filled 2023-10-27: qty 2

## 2023-10-27 MED ORDER — BUPIVACAINE LIPOSOME 1.3 % IJ SUSP
INTRAMUSCULAR | Status: DC | PRN
  Administered 2023-10-27 (×2): 5 mL via PERINEURAL

## 2023-10-27 MED ORDER — POLYETHYLENE GLYCOL 3350 17 G PO PACK
17.0000 g | PACK | Freq: Every day | ORAL | 1 refills | Status: DC
  Filled 2023-10-27: qty 28, 28d supply, fill #0

## 2023-10-27 MED ORDER — MIDAZOLAM HCL 5 MG/5ML IJ SOLN
INTRAMUSCULAR | Status: DC | PRN
  Administered 2023-10-27: 2 mg via INTRAVENOUS
  Administered 2023-10-27 (×2): 1 mg via INTRAVENOUS

## 2023-10-27 MED ORDER — DEXAMETHASONE SODIUM PHOSPHATE 10 MG/ML IJ SOLN
INTRAMUSCULAR | Status: AC
  Filled 2023-10-27: qty 1

## 2023-10-27 MED ORDER — MIDAZOLAM HCL 2 MG/2ML IJ SOLN
INTRAMUSCULAR | Status: AC
  Filled 2023-10-27: qty 2

## 2023-10-27 MED ORDER — EPHEDRINE SULFATE (PRESSORS) 50 MG/ML IJ SOLN
INTRAMUSCULAR | Status: DC | PRN
  Administered 2023-10-27: 10 mg via INTRAVENOUS

## 2023-10-27 MED ORDER — LISINOPRIL 10 MG PO TABS
10.0000 mg | ORAL_TABLET | Freq: Every day | ORAL | Status: DC
  Filled 2023-10-27: qty 1

## 2023-10-27 MED ORDER — ONDANSETRON HCL 4 MG PO TABS: 4.0000 mg | ORAL_TABLET | Freq: Four times a day (QID) | ORAL | Status: DC | PRN

## 2023-10-27 MED ORDER — GABAPENTIN 300 MG PO CAPS
ORAL_CAPSULE | ORAL | Status: AC
Start: 2023-10-27 — End: ?
  Filled 2023-10-27: qty 1

## 2023-10-27 MED ORDER — KETOROLAC TROMETHAMINE 30 MG/ML IJ SOLN
INTRAMUSCULAR | Status: AC
  Filled 2023-10-27: qty 1

## 2023-10-27 MED ORDER — KETOROLAC TROMETHAMINE 30 MG/ML IJ SOLN
30.0000 mg | Freq: Four times a day (QID) | INTRAMUSCULAR | Status: AC
Start: 2023-10-27 — End: 2023-10-28
  Administered 2023-10-27 – 2023-10-28 (×3): 30 mg via INTRAVENOUS

## 2023-10-27 MED ORDER — HYDROMORPHONE HCL 1 MG/ML IJ SOLN
INTRAMUSCULAR | Status: AC
  Filled 2023-10-27: qty 1

## 2023-10-27 MED ORDER — IBUPROFEN 800 MG PO TABS
800.0000 mg | ORAL_TABLET | Freq: Three times a day (TID) | ORAL | 1 refills | Status: AC | PRN
Start: 2023-10-27 — End: ?
  Filled 2023-10-27: qty 90, 30d supply, fill #0
  Filled 2024-03-29: qty 90, 30d supply, fill #1

## 2023-10-27 MED ORDER — BUPIVACAINE LIPOSOME 1.3 % IJ SUSP
INTRAMUSCULAR | Status: AC
  Filled 2023-10-27: qty 10

## 2023-10-27 MED ORDER — BUPIVACAINE-EPINEPHRINE (PF) 0.5% -1:200000 IJ SOLN: INTRAMUSCULAR | Status: DC | PRN

## 2023-10-27 MED ORDER — HYDROMORPHONE HCL 1 MG/ML IJ SOLN
INTRAMUSCULAR | Status: DC | PRN
  Administered 2023-10-27 (×4): .5 mg via INTRAVENOUS

## 2023-10-27 MED ORDER — KETAMINE HCL 100 MG/ML IJ SOLN
INTRAMUSCULAR | Status: DC | PRN
  Administered 2023-10-27: 80 mg via INTRAMUSCULAR
  Administered 2023-10-27 (×2): 10 mg via INTRAMUSCULAR

## 2023-10-27 MED ORDER — KETAMINE HCL 50 MG/5ML IJ SOSY
PREFILLED_SYRINGE | INTRAMUSCULAR | Status: AC
  Filled 2023-10-27: qty 5

## 2023-10-27 MED ORDER — ONDANSETRON HCL 4 MG/2ML IJ SOLN
INTRAMUSCULAR | Status: DC | PRN
  Administered 2023-10-27: 4 mg via INTRAVENOUS

## 2023-10-27 MED ORDER — HYDROMORPHONE HCL 1 MG/ML IJ SOLN: 0.2000 mg | INTRAMUSCULAR | Status: DC | PRN

## 2023-10-27 MED ORDER — STERILE WATER FOR IRRIGATION IR SOLN
Status: DC | PRN
  Administered 2023-10-27: 500 mL

## 2023-10-27 MED ORDER — ONDANSETRON HCL 4 MG/2ML IJ SOLN
4.0000 mg | Freq: Four times a day (QID) | INTRAMUSCULAR | Status: DC | PRN
  Administered 2023-10-28: 4 mg via INTRAVENOUS

## 2023-10-27 SURGICAL SUPPLY — 54 items
APPLICATOR ARISTA FLEXITIP XL (MISCELLANEOUS) IMPLANT
BARRIER ADHS 3X4 INTERCEED (GAUZE/BANDAGES/DRESSINGS) IMPLANT
BLADE SURG 11 STRL SS (BLADE) IMPLANT
CHLORAPREP W/TINT 26 (MISCELLANEOUS) ×1 IMPLANT
COVER BACK TABLE 60X90IN (DRAPES) ×1 IMPLANT
COVER TIP SHEARS 8 DVNC (MISCELLANEOUS) ×1 IMPLANT
DEFOGGER SCOPE WARMER CLEARIFY (MISCELLANEOUS) ×1 IMPLANT
DERMABOND ADVANCED .7 DNX12 (GAUZE/BANDAGES/DRESSINGS) ×1 IMPLANT
DRAPE ARM DVNC X/XI (DISPOSABLE) ×4 IMPLANT
DRAPE COLUMN DVNC XI (DISPOSABLE) ×1 IMPLANT
DRAPE UTILITY XL STRL (DRAPES) ×1 IMPLANT
DRIVER NDL MEGA SUTCUT DVNCXI (INSTRUMENTS) ×1 IMPLANT
DRIVER NDLE MEGA SUTCUT DVNCXI (INSTRUMENTS) ×1 IMPLANT
ELECT REM PT RETURN 9FT ADLT (ELECTROSURGICAL) ×1 IMPLANT
ELECTRODE REM PT RTRN 9FT ADLT (ELECTROSURGICAL) ×1 IMPLANT
EXTRT SYSTEM ALEXIS 17CM (MISCELLANEOUS) ×1 IMPLANT
FORCEPS BPLR R/ABLATION 8 DVNC (INSTRUMENTS) ×1 IMPLANT
FORCEPS TENACULUM DVNC XI (FORCEP) ×1 IMPLANT
GLOVE BIO SURGEON STRL SZ7 (GLOVE) ×2 IMPLANT
GLOVE BIOGEL PI IND STRL 7.0 (GLOVE) ×3 IMPLANT
GOWN STRL REUS W/TWL XL LVL3 (GOWN DISPOSABLE) ×1 IMPLANT
HEMOSTAT ARISTA ABSORB 3G PWDR (HEMOSTASIS) IMPLANT
HOLDER FOLEY CATH W/STRAP (MISCELLANEOUS) IMPLANT
IRRIG SUCT STRYKERFLOW 2 WTIP (MISCELLANEOUS) ×1 IMPLANT
IRRIGATION SUCT STRKRFLW 2 WTP (MISCELLANEOUS) ×1 IMPLANT
KIT PINK PAD W/HEAD ARE REST (MISCELLANEOUS) ×1 IMPLANT
KIT PINK PAD W/HEAD ARM REST (MISCELLANEOUS) ×1 IMPLANT
KIT TURNOVER CYSTO (KITS) ×1 IMPLANT
LEGGING LITHOTOMY PAIR STRL (DRAPES) ×1 IMPLANT
OBTURATOR OPTICAL STND 8 DVNC (TROCAR) ×1 IMPLANT
OBTURATOR OPTICALSTD 8 DVNC (TROCAR) ×1 IMPLANT
PACK ROBOT WH (CUSTOM PROCEDURE TRAY) ×1 IMPLANT
PACK ROBOTIC GOWN (GOWN DISPOSABLE) ×1 IMPLANT
PAD OB MATERNITY 4.3X12.25 (PERSONAL CARE ITEMS) ×1 IMPLANT
SCISSORS MNPLR CVD DVNC XI (INSTRUMENTS) ×1 IMPLANT
SEAL UNIV 5-12 XI (MISCELLANEOUS) ×4 IMPLANT
SET TRI-LUMEN FLTR TB AIRSEAL (TUBING) ×1 IMPLANT
SPONGE T-LAP 18X18 ~~LOC~~+RFID (SPONGE) IMPLANT
SUT DVC VLOC 180 0 12IN GS21 (SUTURE) ×1 IMPLANT
SUT PDS AB 0 CT1 27 (SUTURE) IMPLANT
SUT VIC AB 0 CT1 27XBRD ANBCTR (SUTURE) IMPLANT
SUT VIC AB 2-0 CT1 TAPERPNT 27 (SUTURE) IMPLANT
SUT VIC AB 3-0 SH 27XBRD (SUTURE) IMPLANT
SUT VIC AB 4-0 KS 27 (SUTURE) IMPLANT
SUT VLOC 180 2-0 9IN GS21 (SUTURE) IMPLANT
SUTURE DVC VLC 180 0 12IN GS21 (SUTURE) IMPLANT
SYS BAG RETRIEVAL 10MM (BASKET) IMPLANT
SYSTEM BAG RETRIEVAL 10MM (BASKET) IMPLANT
SYSTEM CONTND EXTRCTN KII BLLN (MISCELLANEOUS) IMPLANT
TOWEL OR 17X24 6PK STRL BLUE (TOWEL DISPOSABLE) ×1 IMPLANT
TRAY FOLEY W/BAG SLVR 14FR (SET/KITS/TRAYS/PACK) ×1 IMPLANT
TROCAR PORT AIRSEAL 8X120 (TROCAR) IMPLANT
UNDERPAD 30X36 HEAVY ABSORB (UNDERPADS AND DIAPERS) ×1 IMPLANT
WATER STERILE IRR 500ML POUR (IV SOLUTION) IMPLANT

## 2023-10-27 NOTE — Anesthesia Procedure Notes (Signed)
 Anesthesia Regional Block: TAP block   Pre-Anesthetic Checklist: , timeout performed,  Correct Patient, Correct Site, Correct Laterality,  Correct Procedure, Correct Position, site marked,  Risks and benefits discussed,  Pre-op evaluation,  At surgeon's request and post-op pain management  Laterality: Left and Right  Prep: Maximum Sterile Barrier Precautions used, chloraprep       Needles:  Injection technique: Single-shot  Needle Type: Echogenic Stimulator Needle     Needle Length: 9cm  Needle Gauge: 21     Additional Needles:   Narrative:  Start time: 10/27/2023 7:53 AM End time: 10/27/2023 8:08 AM Injection made incrementally with aspirations every 5 mL.  Performed by: Personally  Anesthesiologist: Epifanio Fallow, MD  Additional Notes: Bilateral TAP blocks

## 2023-10-27 NOTE — Op Note (Addendum)
 Latoya Juarez PROCEDURE DATE: 10/27/2023  PREOPERATIVE DIAGNOSIS: fibroid  POSTOPERATIVE DIAGNOSIS: fibroid PROCEDURE:    robotic assisted laparoscopic myomectomy, lysis of adhesions, mini-laparotomy SURGEON: Dudley Mages, MD ASSISTANT:  Vina Solian, MD; Jorene Moats, [place]  An experienced assistant was required given the standard of surgical care given the complexity of the case.  This assistant was needed for exposure, dissection, suctioning, retraction, instrument exchange, and for overall help during the procedure.  INDICATIONS: 53 y.o. [id number] with fibroids.  Risks of surgery were discussed with the patient including but not limited to: bleeding which may require transfusion; infection which may require antibiotics; injury to surrounding organs; need for additional procedures including laparotomy;  and other postoperative/anesthesia complications. Written informed consent was obtained.    FINDINGS:  Normal external genitalia, 25 wk size non-mobile uterus with  abnormal  contours, and mobile fundal uterus, well healed low transverse incision, cervix pulled anteriorly Laparoscopically: normal upper abdominal survey, multifibroid uterus with dense anterior adhesions and multiple fibroid, bilateral fallopian tubes with decreased width at the midpoint presumably due to prior tubal ligation, normal bilateral ovaries, normal posterior cul de sac, enlarged pedunculated fibroid (~1600 g) with several adhesions to the omentum and mesentery of the small bowel, omental adhesions to the anterior abdominal wall, small umbilical hernia  ANESTHESIA: General, TAP block INTRAVENOUS FLUIDS:  1600 ml of LR ESTIMATED BLOOD LOSS:  100 ml URINE OUTPUT: 150 ml SPECIMENS: fibroid COMPLICATIONS:  None immediate.  The risks, benefits, and alternatives of surgery were explained, understood, and accepted. Consents were signed. All questions were answered. She was taken to the operating room and general anesthesia  was applied without complication. She was placed in the dorsal lithotomy position and her abdomen and vagina were prepped and draped after she had been carefully positioned on the table. A bimanual exam revealed a 12 week size uterus that was adherent to the anterior abdominal wall. Her adnexa were not enlarged. A Foley catheter was placed and it drained clear throughout the case. A speculum was placed and the cervix was incompletely visualized. A sponge stick was then placed in the vagina. Gloves were changed and attention was turned to the abdomen. An 8mm incision was made in the LUQ and an optiview airseal trocar was introduced into the abdomen. The Entry was confirmed with low opening intraabdominal pressure and visualization and the abdomen was then insufflated. After good pneumoperitoneum was established, the abdomen was surveyed including the upper abdomen.She was placed in Trendelenburg position and ports were placed in appropriate positions on her abdomen to allow maximum exposure during the robotic case. Specifically, trocars were placed umbilically, in the LLQ, RLQ, and RUQ.  These were all placed under direct laparoscopic visualization. The robot was docked and I proceeded with a robotic portion of the case.  The pelvis was inspected and the above findings were noted. The stalk of the fibroid was cauterized  and cut using monopolar scissors and the myomectomy completed.  The omental adhesion to the anterior abdominal was taken down with sharp and blunt dissection.  Attention was turned to the omentum adhesion just inferior to the umbilicus, which was taken down with sharp dissection.  The myometrium of the fibroid stalk was closed in 2 layers using 0 Vloc and the serosa reapproximated in a baseball stitch fashion.  The closure was noted to be hemostatic.  The fibroid was then manipulated to take down it's parasitic omental and mesenteric adhesions using sharp and blunt dissection, with care to avoid  the major  vessels of the mesentery and the bowel.  Once the fibroid was completely mobilized and free of all of its attachments, the robot was undocked.  Attention was turned to the abdomen where the umbilical incision was extended to approximately 4 cm.  The fibroid was placed into a large specimen bag, and the opening of the bag brought up to the incision.  The fibroid was then morcellated manually until the entire specimen was removed.  Given the extensive dissection including the mesentery, general surgery was consulted and inspected the mesentery and bowel; minimal bleeding was noted and the bowel was found to be intact (see separate procedure note).  At this time the bowel was returned to the abdomen.  The fascia was closed with 0 Vicryl in a running fashion.  Subcutaneous tissue was closed with 2-0 Vicryl in an interrupted fashion.  The camera was reintroduced to the abdomen and the abdominal pelvic cavity was inspected.  The myomectomy site was noted to be hemostatic, and the pelvis was irrigated. The skin from all of the other ports was closed with 4-0 vicryl. The patient was then extubated and taken to recovery in stable condition.   Sponge, lap and needle counts were correct x 2.   Carter Quarry, MD Minimally Invasive Gynecologic Surgery  Obstetrics and Gynecology, [facility] for [facility], [facility] 10/27/2023

## 2023-10-27 NOTE — Op Note (Signed)
 Operative Note  [name]  [id number]  [id number]  10/27/2023   Surgeon: Mitzie Freund MD FACS   Procedure performed: examination of small bowel and mesentery   Preop diagnosis: mesenteric injury  Post-op diagnosis/intraop findings: same, partial thickness   Specimens: no Retained items: no  EBL: minimal cc Complications: none   Description of procedure: Patient is under general anesthesia and has undergone robotic assisted myomectomy by Dr. Jeralyn.  Intraoperative consultation requested to evaluate small bowel and mesentery following dissection of parasitic attachment of the fibroid.  The distal small bowel was eviscerated and examined, all of this appeared to be viable and free of injury.  Along the terminal ileum, there is an approximately 10 cm area of the medial mesentery which has been deperitonealized during the dissection of the adherent fibroid from this area.  This area is hemostatic.  There is no full-thickness injury to the mesentery.  Palpable pulses remain in the mesentery and again the adjacent bowel appears viable.  The small bowel was returned to the abdominal cavity and the case was turned back over to the primary surgeon.

## 2023-10-27 NOTE — Brief Op Note (Signed)
 10/27/2023  11:54 AM  PATIENT:  Latoya Juarez  53 y.o. female  PRE-OPERATIVE DIAGNOSIS:  Fibroids  POST-OPERATIVE DIAGNOSIS:  Fibroids  PROCEDURE:  Procedure(s) with comments: XI ROBOTIC ASSISTED MYOMECTOMY LYSIS OF ADHESIONS, Fibriod Morcelation, Mini Laparotomy (N/A) - TAP BLOCK  SURGEON:  Surgeons and Role:    DEWAINE Jeralyn Crutch, MD - Primary    * Signe Mitzie LABOR, MD - Assisting    * Cleatus Moccasin, MD - Assisting    PHYSICIAN ASSISTANT: Nicholaus Pippin  ASSISTANTS: as noted above   ANESTHESIA:   general and TAP block (see anesthesia note)  EBL:  100 mL   BLOOD ADMINISTERED:none  DRAINS: none   LOCAL MEDICATIONS USED:  BUPIVICAINE   SPECIMEN:  Source of Specimen:  fibroid  DISPOSITION OF SPECIMEN:  PATHOLOGY  COUNTS:  YES  TOURNIQUET:  * No tourniquets in log *  DICTATION: .Note written in EPIC  PLAN OF CARE:  extended recovery  PATIENT DISPOSITION:  PACU - hemodynamically stable.   Delay start of Pharmacological VTE agent (>24hrs) due to surgical blood loss or risk of bleeding: not applicable

## 2023-10-27 NOTE — Discharge Instructions (Signed)
 Post-surgical Instructions, Outpatient Surgery  You may expect to feel dizzy, weak, and drowsy for as long as 24 hours after receiving the medicine that made you sleep (anesthetic). For the first 24 hours after your surgery:   Do not drive a car, ride a bicycle, participate in physical activities, or take public transportation until you are done taking narcotic pain medicines or as directed by Dr. Jeralyn.  Do not drink alcohol or take tranquilizers.  Do not take medicine that has not been prescribed by your physicians.  Do not sign important papers or make important decisions while on narcotic pain medicines.  Have a responsible person with you.   CARE OF INCISION If you have a bandage, you may remove it in one day.  If there are steri-strips or dermabond, just let this loosen on its own.  You may shower on the first day after your surgery.  Do not sit in a tub bath for one week. Avoid heavy lifting (more than 10 pounds/4.5 kilograms), pushing, or pulling.  Avoid activities that may risk injury to your incisions.   PAIN MANAGEMENT Ibuprofen  800mg .  (This is the same as 4-200mg  over the counter tablets of Motrin  or ibuprofen .)  Take this every 6 hours or as needed for cramping.   Acetaminophen  1000mg  (This is the same as 2-500mg  over the counter extra strength tylenol ). Take this every 6 hours for the first 3 days or as needed afterwards for pain Oxycodone  5mg  For more severe pain, take one or two tablets every four to six hours as needed for pain control.  (Remember that narcotic pain medications increase your risk of constipation.  If this becomes a problem, you may take an over the counter laxative like miralax .)  DO'S AND DON'T'S Do not take a tub bath for 4 weeks.  You may shower on the first day after your surgery Do not do any heavy lifting for one to two weeks.  This increases the chance of bleeding. Do move around as you feel able.  Stairs are fine.  You may begin to exercise again as  you feel able.  Do not lift any weights for two weeks. Do not put anything in the vagina for two weeks--no tampons, intercourse, or douching.    REGULAR MEDIATIONS/VITAMINS: You may restart all of your regular medications as prescribed. You may restart all of your vitamins as you normally take them.    PLEASE CALL OR SEEK MEDICAL CARE IF: You have persistent nausea and vomiting.  You have trouble eating or drinking.  You have an oral temperature above 100.5.  You have constipation that is not helped by adjusting diet or increasing fluid intake. Pain medicines are a common cause of constipation.  You have heavy vaginal bleeding You have redness or drainage from your incision(s) or there is increasing pain or tenderness near or in the surgical site.

## 2023-10-27 NOTE — Progress Notes (Signed)
 Patient urinated 50cc. It was a dark, amber color. MD notified and new orders were given to start LR at 125cc/hr. MD also stated to not give patient's blood pressure medications.

## 2023-10-27 NOTE — Anesthesia Procedure Notes (Signed)
 Procedure Name: Intubation Date/Time: 10/27/2023 7:53 AM  Performed by: Pam Macario BROCKS, CRNAPre-anesthesia Checklist: Patient identified, Emergency Drugs available, Suction available, Patient being monitored and Timeout performed Patient Re-evaluated:Patient Re-evaluated prior to induction Oxygen Delivery Method: Circle system utilized Preoxygenation: Pre-oxygenation with 100% oxygen Induction Type: IV induction Ventilation: Mask ventilation without difficulty Laryngoscope Size: Mac and 4 Grade View: Grade II Tube type: Oral Tube size: 7.0 mm Number of attempts: 1 Airway Equipment and Method: Stylet and Oral airway Placement Confirmation: ETT inserted through vocal cords under direct vision, positive ETCO2, breath sounds checked- equal and bilateral and CO2 detector Secured at: 23 cm Tube secured with: Tape Dental Injury: Teeth and Oropharynx as per pre-operative assessment

## 2023-10-27 NOTE — H&P (Signed)
 OB/GYN Pre-Op History and Physical  Latoya Juarez is a 53 y.o. [id number] presenting for RA-myomectomy. Notes having weight loss due to early satiety due to the fibroid. Continues to have menses.        Past Medical History:  Diagnosis Date   Arthritis    knees   Chronic constipation    Depression    Hemorrhoids    Hyperlipidemia, mixed    Hypertension    IDA (iron  deficiency anemia)    Obesity    Prediabetes    Uterine leiomyoma     Past Surgical History:  Procedure Laterality Date   CESAREAN SECTION     x5 --- first [id number] ;   last one 11/ 1999  with BILATERAL TUBAL LIGATION   COLONOSCOPY WITH PROPOFOL   11/22/2021   dr shila    OB History  Gravida Para Term Preterm AB Living  5 5 5  0 0 5  SAB IAB Ectopic Multiple Live Births  0 0 0 0 5    # Outcome Date GA Lbr Len/2nd Weight Sex Type Anes PTL Lv  5 Term      CS-LTranv   LIV  4 Term      CS-LTranv   LIV  3 Term      CS-LTranv   LIV  2 Term      CS-LTranv   LIV  1 Term      CS-LTranv   LIV    Social History   Socioeconomic History   Marital status: Divorced    Spouse name: Not on file   Number of children: 5   Years of education: Not on file   Highest education level: Not on file  Occupational History   Not on file  Tobacco Use   Smoking status: Every Day    Average packs/day: 0.5 packs/day for 10.0 years (5.0 ttl pk-yrs)    Types: Cigarettes    Start date: 20   Smokeless tobacco: Never   Tobacco comments:    10-16-2023  per pt smokes 1pp2d,  started age 7  Vaping Use   Vaping status: Never Used  Substance and Sexual Activity   Alcohol use: Yes    Alcohol/week: 14.0 - 21.0 standard drinks of alcohol    Types: 14 - 21 Standard drinks or equivalent per week    Comment: 10-16-2023 per pt average 2 drinks per day;  last drink Sept 2023; previously with heavy alcohol use   Drug use: Yes    Types: Marijuana    Comment: 10-16-2023  pt stated smokes marijuana daily   Sexual activity: Yes     Partners: Male    Birth control/protection: Surgical, Condom  Other Topics Concern   Not on file  Social History Narrative   Not on file   Social Drivers of Health   Financial Resource Strain: Not on file  Food Insecurity: No Food Insecurity (01/29/2023)   Hunger Vital Sign    Worried About Running Out of Food in the Last Year: Never true    Ran Out of Food in the Last Year: Never true  Transportation Needs: No Transportation Needs (01/29/2023)   PRAPARE - Administrator, Civil Service (Medical): No    Lack of Transportation (Non-Medical): No  Physical Activity: Not on file  Stress: Not on file  Social Connections: Not on file    Family History  Problem Relation Age of Onset   Diabetes Mother    Hypertension Mother    Stroke Mother  Hypertension Brother    Diabetes Brother    Stroke Brother    Heart attack Brother    Breast cancer Maternal Aunt    Colon cancer Neg Hx    Esophageal cancer Neg Hx    Rectal cancer Neg Hx    Stomach cancer Neg Hx     Medications Prior to Admission  Medication Sig Dispense Refill Last Dose/Taking   fluticasone  (FLONASE ) 50 MCG/ACT nasal spray Place 2 sprays into both nostrils daily. (Patient taking differently: Place 2 sprays into both nostrils daily as needed for allergies or rhinitis.) 16 g 6 Past Month   lisinopril -hydrochlorothiazide  (ZESTORETIC ) 10-12.5 MG tablet Take 1 tablet by mouth daily. (Patient taking differently: Take 1 tablet by mouth daily.) 90 tablet 1 10/26/2023   metFORMIN  (GLUCOPHAGE ) 500 MG tablet Take 1 tablet (500 mg total) by mouth daily with breakfast. (Patient taking differently: Take 500 mg by mouth daily with breakfast.) 90 tablet 1 10/26/2023   pravastatin  (PRAVACHOL ) 40 MG tablet Take 1 tablet (40 mg total) by mouth daily. 90 tablet 0 10/27/2023 at  4:10 AM   diclofenac  Sodium (VOLTAREN ) 1 % GEL Apply 2 g topically 4 (four) times daily. To both knees (Patient taking differently: Apply 2 g topically 4 (four)  times daily as needed. To both knees) 200 g 1 More than a month   ferrous sulfate  325 (65 FE) MG EC tablet Take 325 mg by mouth 3 (three) times daily with meals. (Patient not taking: Reported on 10/16/2023)   More than a month   sennosides-docusate sodium  (SENOKOT-S) 8.6-50 MG tablet Take 2 tablets by mouth daily as needed for constipation.   Unknown    Allergies  Allergen Reactions   Hydroxyzine  Other (See Comments)    Nightmares    Review of Systems: Negative except for what is mentioned in HPI.     Physical Exam: BP (!) 146/99   Pulse 69   Temp 98 F (36.7 C) (Oral)   Resp 17   Ht 5' 1 (1.549 m)   Wt 86.3 kg   LMP 09/29/2023 (Exact Date)   SpO2 99%   BMI 35.96 kg/m  CONSTITUTIONAL: Well-developed, well-nourished and in no acute distress.  HENT:  Normocephalic, atraumatic, External right and left ear normal. Oropharynx is clear and moist EYES: Conjunctivae and EOM are normal. Pupils are equal, round, and reactive to light. No scleral icterus.  NECK: Normal range of motion, supple, no masses SKIN: Skin is warm and dry. No rash noted. Not diaphoretic. No erythema. No pallor. NEUROLGIC: Alert and oriented to person, place, and time. Normal reflexes, muscle tone coordination. No cranial nerve deficit noted. PSYCHIATRIC: Normal mood and affect. Normal behavior. Normal judgment and thought content. RESPIRATORY: Normal effort PELVIC: Deferred   Pertinent Labs/Studies:   Results for orders placed or performed during the hospital encounter of 10/27/23 (from the past 72 hours)  Pregnancy, urine POC     Status: None   Collection Time: 10/27/23  5:45 AM  Result Value Ref Range   Preg Test, Ur NEGATIVE NEGATIVE    Comment:        THE SENSITIVITY OF THIS METHODOLOGY IS >24 mIU/mL        Assessment and Plan :Latoya Juarez is a 52 y.o. [id number] here for surgical management of large pedunculated fibroid.   Patient desires surgical management with RA-Myomectomy.  The risks of  surgery were discussed in detail with the patient including but not limited to: bleeding which may require transfusion or reoperation;  infection which may require prolonged hospitalization or re-hospitalization and antibiotic therapy; injury to bowel, bladder, ureters and major vessels or other surrounding organs which may lead to other procedures; formation of adhesions; need for additional procedures including laparotomy or subsequent procedures secondary to intraoperative injury or abnormal pathology; thromboembolic phenomenon; incisional problems and other postoperative or anesthesia complications.  Patient was told that the likelihood that her condition and symptoms will be treated effectively with this surgical management was high; the postoperative expectations were also discussed in detail. The patient also understands the alternative treatment options which were discussed in full. All questions were answered.    Maddyson Keil, M.D. Minimally Invasive Gynecologic Surgery and Pelvic Pain Specialist Attending Obstetrician & Gynecologist, [facility] for [employer], [facility]

## 2023-10-27 NOTE — Transfer of Care (Signed)
 Immediate Anesthesia Transfer of Care Note  Patient: Latoya Juarez  Procedure(s) Performed: Procedure(s) (LRB): XI ROBOTIC ASSISTED MYOMECTOMY LYSIS OF ADHESIONS, Fibriod Morcelation, Mini Laparotomy (N/A)  Patient Location: PACU  Anesthesia Type: GA  Level of Consciousness: awake, sedated, patient cooperative and responds to stimulation, c/o pain in back - comfort measures given w/ medication   Airway & Oxygen Therapy: Patient Spontanous Breathing and Patient connected to [place] oxygen  Post-op Assessment: Report given to PACU RN, Post -op Vital signs reviewed and stable and Patient moving all extremities  Post vital signs: Reviewed and stable  Complications: No apparent anesthesia complications

## 2023-10-27 NOTE — Anesthesia Postprocedure Evaluation (Signed)
 Anesthesia Post Note  Patient: [name]  Procedure(s) Performed: XI ROBOTIC ASSISTED MYOMECTOMY LYSIS OF ADHESIONS, Fibriod Morcelation, Mini Laparotomy     Patient location during evaluation: PACU Anesthesia Type: General and Regional Level of consciousness: awake and alert Pain management: pain level controlled Vital Signs Assessment: post-procedure vital signs reviewed and stable Respiratory status: spontaneous breathing, nonlabored ventilation and respiratory function stable Cardiovascular status: blood pressure returned to baseline and stable Postop Assessment: no apparent nausea or vomiting Anesthetic complications: no  No notable events documented.  Last Vitals:  Vitals:   10/27/23 1227 10/27/23 1230  BP:  108/75  Pulse: 74 73  Resp: 19 11  Temp:    SpO2: 90% 94%    Last Pain:  Vitals:   10/27/23 1227  TempSrc:   PainSc: 5                  Javian Nudd,W. EDMOND

## 2023-10-28 ENCOUNTER — Encounter (HOSPITAL_BASED_OUTPATIENT_CLINIC_OR_DEPARTMENT_OTHER): Payer: Self-pay | Admitting: Obstetrics and Gynecology

## 2023-10-28 ENCOUNTER — Other Ambulatory Visit: Payer: Self-pay

## 2023-10-28 DIAGNOSIS — D251 Intramural leiomyoma of uterus: Secondary | ICD-10-CM | POA: Diagnosis not present

## 2023-10-28 LAB — SURGICAL PATHOLOGY

## 2023-10-28 MED ORDER — KETOROLAC TROMETHAMINE 30 MG/ML IJ SOLN
INTRAMUSCULAR | Status: AC
  Filled 2023-10-28: qty 1

## 2023-10-28 MED ORDER — OXYCODONE HCL 5 MG PO TABS
ORAL_TABLET | ORAL | Status: AC
  Filled 2023-10-28: qty 2

## 2023-10-28 MED ORDER — SIMETHICONE 80 MG PO CHEW
CHEWABLE_TABLET | ORAL | Status: AC
  Filled 2023-10-28: qty 1

## 2023-10-28 MED ORDER — ACETAMINOPHEN 500 MG PO TABS
ORAL_TABLET | ORAL | Status: AC
  Filled 2023-10-28: qty 2

## 2023-10-28 MED ORDER — ONDANSETRON HCL 4 MG PO TABS
4.0000 mg | ORAL_TABLET | Freq: Three times a day (TID) | ORAL | 0 refills | Status: DC | PRN
  Filled 2023-10-28: qty 20, 7d supply, fill #0

## 2023-10-28 MED ORDER — ONDANSETRON HCL 4 MG/2ML IJ SOLN
INTRAMUSCULAR | Status: AC
  Filled 2023-10-28: qty 2

## 2023-10-28 MED ORDER — SIMETHICONE 80 MG PO CHEW
80.0000 mg | CHEWABLE_TABLET | Freq: Four times a day (QID) | ORAL | 0 refills | Status: DC | PRN
  Filled 2023-10-28: qty 30, 8d supply, fill #0

## 2023-10-28 NOTE — Progress Notes (Addendum)
 1 Day Post-Op Procedure(s) (LRB): XI ROBOTIC ASSISTED MYOMECTOMY LYSIS OF ADHESIONS, Fibriod Morcelation, Mini Laparotomy (N/A)  Subjective: Patient reports nausea, incisional pain, tolerating PO, and no problems voiding.  Has been able to ambulate. Intermittent nausea with vomiting x2 but otherwise able to tolerate po. Feeling great overall  Objective: I have reviewed patient's vital signs, intake and output, and medications.  General: alert, cooperative, and no distress Resp: normal respiratory effort GI: normal findings: soft, appropriatley tender and incision: clean and gauze on bilateral lower quadrant incisions  Assessment: s/p Procedure(s) with comments: XI ROBOTIC ASSISTED MYOMECTOMY LYSIS OF ADHESIONS, Fibriod Morcelation, Mini Laparotomy (N/A) - TAP BLOCK: stable  Plan: Advance diet Encourage ambulation Discharge home  LOS: 0 days    Carter Quarry, MD 10/28/2023, 7:31 AM

## 2023-11-05 ENCOUNTER — Telehealth: Payer: Self-pay | Admitting: Obstetrics and Gynecology

## 2023-11-05 ENCOUNTER — Encounter: Payer: Self-pay | Admitting: Obstetrics and Gynecology

## 2023-11-05 ENCOUNTER — Other Ambulatory Visit: Payer: Self-pay

## 2023-11-05 ENCOUNTER — Ambulatory Visit (INDEPENDENT_AMBULATORY_CARE_PROVIDER_SITE_OTHER): Payer: BC Managed Care – PPO | Admitting: Obstetrics and Gynecology

## 2023-11-05 VITALS — BP 109/74 | HR 97 | Wt 183.7 lb

## 2023-11-05 DIAGNOSIS — Z1331 Encounter for screening for depression: Secondary | ICD-10-CM

## 2023-11-05 DIAGNOSIS — G8918 Other acute postprocedural pain: Secondary | ICD-10-CM

## 2023-11-05 LAB — CBC
Hematocrit: 37.6 % (ref 34.0–46.6)
Hemoglobin: 12.8 g/dL (ref 11.1–15.9)
MCH: 31.8 pg (ref 26.6–33.0)
MCHC: 34 g/dL (ref 31.5–35.7)
MCV: 94 fL (ref 79–97)
Platelets: 590 10*3/uL — ABNORMAL HIGH (ref 150–450)
RBC: 4.02 x10E6/uL (ref 3.77–5.28)
RDW: 13.6 % (ref 11.7–15.4)
WBC: 9.7 10*3/uL (ref 3.4–10.8)

## 2023-11-05 LAB — COMPREHENSIVE METABOLIC PANEL
ALT: 8 [IU]/L (ref 0–32)
AST: 12 [IU]/L (ref 0–40)
Albumin: 4 g/dL (ref 3.8–4.9)
Alkaline Phosphatase: 77 [IU]/L (ref 44–121)
BUN/Creatinine Ratio: 15 (ref 9–23)
BUN: 13 mg/dL (ref 6–24)
Bilirubin Total: 0.4 mg/dL (ref 0.0–1.2)
CO2: 28 mmol/L (ref 20–29)
Calcium: 10.8 mg/dL — ABNORMAL HIGH (ref 8.7–10.2)
Chloride: 100 mmol/L (ref 96–106)
Creatinine, Ser: 0.86 mg/dL (ref 0.57–1.00)
Globulin, Total: 2.8 g/dL (ref 1.5–4.5)
Glucose: 132 mg/dL — ABNORMAL HIGH (ref 70–99)
Potassium: 3.9 mmol/L (ref 3.5–5.2)
Sodium: 137 mmol/L (ref 134–144)
Total Protein: 6.8 g/dL (ref 6.0–8.5)
eGFR: 82 mL/min/{1.73_m2} (ref >59)

## 2023-11-05 MED ORDER — OXYCODONE HCL 5 MG PO TABS
5.0000 mg | ORAL_TABLET | ORAL | 0 refills | Status: DC | PRN
  Filled 2023-11-05: qty 20, 4d supply, fill #0

## 2023-11-05 NOTE — Telephone Encounter (Signed)
Called patient and confirmed ID x2  Ate some fruit and it stayed down. Feeling better and doing ok right now. Reviewed labs within normal of immediate postop. Reviewed signs/symptoms to present to ER for care. If doing ok, will see at next appointment in 1 week.

## 2023-11-05 NOTE — Telephone Encounter (Signed)
Called patient and confirmed ID x2.   Patient reports intermittent abdominal pain, primarily around navel that worsens with eating. May have nausea and subsequently vomit after eating and has relief after vomiting. Passing flatus and has liquid stool. No fever or chills. Taking tylenol and ibuprofen, completed oxy scrip. Took zofran maybe twice since discharge.   Plan for [facility] to come to [facility] for in person evaluation and determine further workup.

## 2023-11-05 NOTE — Progress Notes (Signed)
GYNECOLOGY VISIT  Patient name: Latoya Juarez MRN [id number]  Date of birth: [birth year] Chief Complaint:   Gynecologic Exam and post op   History:  AMIRE ABRAM is a 53 y.o. [id number] being seen today for evaluation of postop pain and nausea/vomiting.  This morning had toast and scrambled eggs and staying asway from greasy and fried foods. Has not been able to eat much. This morning the food stayed down but had some pain but not as severe as yestereday. Having loose stools. Has taken milk of magnesium. Has taken miralax without much relief. Gasx in hospital she felt it worked better.    Past Medical History:  Diagnosis Date   Arthritis    knees   Chronic constipation    Depression    Hemorrhoids    Hyperlipidemia, mixed    Hypertension    IDA (iron deficiency anemia)    Obesity    Prediabetes    Uterine leiomyoma     Past Surgical History:  Procedure Laterality Date   CESAREAN SECTION     x5 --- first [id number] ;   last one 11/ 1999  with BILATERAL TUBAL LIGATION   COLONOSCOPY WITH PROPOFOL  11/22/2021   dr Lavon Paganini   ROBOT ASSISTED MYOMECTOMY N/A 10/27/2023   Procedure: XI ROBOTIC ASSISTED MYOMECTOMY LYSIS OF ADHESIONS, Fibriod Morcelation, Mini Laparotomy;  Surgeon: Lorriane Shire, MD;  Location: [facility];  Service: Gynecology;  Laterality: N/A;  TAP BLOCK    The following portions of the patient's history were reviewed and updated as appropriate: allergies, current medications, past family history, past medical history, past social history, past surgical history and problem list.   Review of Systems:  Pertinent items are noted in HPI. Comprehensive review of systems was otherwise negative.   Objective:  Physical Exam BP 109/74   Pulse 97   Wt 183 lb 11.2 oz (83.3 kg)   LMP 09/29/2023 (Exact Date)   BMI 34.71 kg/m    Physical Exam Vitals and nursing note reviewed.  Constitutional:      Appearance: Normal appearance.  HENT:     Head:  Normocephalic and atraumatic.  Pulmonary:     Effort: Pulmonary effort is normal.  Abdominal:     Palpations: Abdomen is soft.     Comments: Well approximated incision without surrounding erythema Umbilical incision tender to palpation, nonerythematous No rebound or guarding  Skin:    General: Skin is warm and dry.  Neurological:     General: No focal deficit present.     Mental Status: She is alert.  Psychiatric:        Mood and Affect: Mood normal.        Behavior: Behavior normal.        Thought Content: Thought content normal.        Judgment: Judgment normal.       Assessment & Plan:   1. Postoperative pain (Primary) Additional pain medication given today. Zofran as needed for nausea. Continue milk of magnesium for constipation. CBC and CMP. Passing flatus and stool, low concern for SBO, possible mild ileus. If pain not improved or develops complete PO intolerance, recommend ER evaluation with CT A/P to rule out delayed injury or obstruction or other pathology. Reviewed intra-op findings including mesenteric adhesion to fibroid.  - CBC - Comprehensive metabolic panel - oxyCODONE (OXY IR/ROXICODONE) 5 MG immediate release tablet; Take 1 tablet (5 mg total) by mouth every 4 (four) hours as needed for severe pain (pain  score 7-10) or breakthrough pain.  Dispense: 20 tablet; Refill: 0   Routine preventative health maintenance measures emphasized.  Lorriane Shire, MD Minimally Invasive Gynecologic Surgery Center for [facility], [facility]

## 2023-11-09 ENCOUNTER — Encounter: Payer: Self-pay | Admitting: Obstetrics and Gynecology

## 2023-11-11 NOTE — Progress Notes (Unsigned)
   POSTOPERATIVE VISIT NOTE   Subjective:     Latoya Juarez is a 53 y.o. [id number] who presents to the clinic 2 weeks status post myomectomy for fibroids.  Seen last week for abdominal pain. Given additional oxycodone and recommend zofran. CMP and CBC wnl.  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   Review of Systems Pertinent items are noted in HPI.    Objective:    LMP 09/29/2023 (Exact Date)  General:  alert, cooperative, and no distress  Abdomen: soft, non-tender  Incision:   healing well, no drainage, no erythema, no hernia, no seroma, no swelling, no dehiscence, incision well approximated  Pelvic:   Exam deferred.    Pathology Results: FINAL MICROSCOPIC DIAGNOSIS:   A. FIBROID, MYOMECTOMY:  - Fragments of benign smooth muscle, consistent with clinically stated  leiomyoma  - No evidence of malignancy    Assessment:   Doing well postoperatively. Operative findings again reviewed. Pathology report discussed.   Plan:    1. Postoperative pain Resolved  2. Postop check (Primary) Doing well  Postop goals Will see for final postop feb 7   Activity restrictions: no lifting more than 10 pounds x2 weeks Anticipated return to work: now and Feb 8 . Follow up: feb 7  Lorriane Shire, MD Obstetrician & Gynecologist, [facility] for [facility], [facility]

## 2023-11-12 ENCOUNTER — Ambulatory Visit: Payer: No Typology Code available for payment source | Admitting: Obstetrics and Gynecology

## 2023-11-12 ENCOUNTER — Other Ambulatory Visit: Payer: Self-pay

## 2023-11-12 VITALS — BP 106/67 | HR 106 | Wt 190.0 lb

## 2023-11-12 DIAGNOSIS — G8918 Other acute postprocedural pain: Secondary | ICD-10-CM

## 2023-11-12 DIAGNOSIS — Z09 Encounter for follow-up examination after completed treatment for conditions other than malignant neoplasm: Secondary | ICD-10-CM

## 2023-11-27 ENCOUNTER — Ambulatory Visit: Payer: BC Managed Care – PPO | Admitting: Obstetrics and Gynecology

## 2023-11-27 VITALS — BP 122/78 | HR 80 | Ht 61.0 in | Wt 200.0 lb

## 2023-11-27 DIAGNOSIS — Z09 Encounter for follow-up examination after completed treatment for conditions other than malignant neoplasm: Secondary | ICD-10-CM

## 2023-11-27 DIAGNOSIS — D259 Leiomyoma of uterus, unspecified: Secondary | ICD-10-CM

## 2023-11-27 NOTE — Progress Notes (Signed)
   POSTOPERATIVE VISIT NOTE   Subjective:     Latoya Juarez is a 53 y.o. [id number] who presents to the clinic 4 weeks status post RA myomectomy for fibroids. Eating a regular diet without difficulty. Bowel movements are normal. Pain is controlled without any medications. 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   The following portions of the patient's history were reviewed and updated as appropriate: allergies, current medications, past family history, past medical history, past social history, past surgical history, and problem list..   Review of Systems Pertinent items are noted in HPI.    Objective:    BP 122/78   Pulse 80   Ht 5' 1 (1.549 m)   Wt 200 lb (90.7 kg)   LMP 11/01/2023 (Exact Date)   BMI 37.79 kg/m  General:  alert, cooperative, and no distress  Abdomen: soft, minimally-tender  Incision:   healing well, no drainage, no erythema, no hernia, no seroma, no swelling, no dehiscence, incision well approximated  Pelvic:   Exam deferred.    Pathology Results: FINAL MICROSCOPIC DIAGNOSIS:   A. FIBROID, MYOMECTOMY:  - Fragments of benign smooth muscle, consistent with clinically stated  leiomyoma  - No evidence of malignancy    Assessment:   Doing well postoperatively. Operative findings again reviewed. Pathology report discussed.   Plan:    1. Postop check (Primary) Doing well and will return to secondary job at this time. Has healed well from postoperative perspective.   2. Uterine leiomyoma, unspecified location   Activity restrictions: none Anticipated return to work: now. Follow up: as needed   Greely Atiyeh, MD Obstetrician & Gynecologist, [facility] for [employer], [facility]

## 2023-11-30 ENCOUNTER — Encounter: Payer: Self-pay | Admitting: *Deleted

## 2023-12-14 ENCOUNTER — Other Ambulatory Visit: Payer: Self-pay

## 2023-12-23 ENCOUNTER — Other Ambulatory Visit: Payer: Self-pay

## 2024-01-18 ENCOUNTER — Other Ambulatory Visit: Payer: Self-pay

## 2024-01-19 ENCOUNTER — Other Ambulatory Visit: Payer: Self-pay

## 2024-02-05 ENCOUNTER — Ambulatory Visit: Payer: Self-pay | Admitting: Nurse Practitioner

## 2024-02-05 NOTE — Telephone Encounter (Signed)
 Last ov with provider was 06/16/2023. Will forward to provider

## 2024-02-05 NOTE — Telephone Encounter (Signed)
  Chief Complaint: upper lip swelling , takes lisinopril  since 2018 Symptoms: upper lip very swollen. Started early hours this am. Took 2 benadryl per pharmacy recommendations per patient report. Patient did report she took one bite of zeti pasta last night but never allergic to food before  Frequency: this am  Pertinent Negatives: Patient denies chest pain no difficulty breathing no rash no tongue or throat swelling no fever Disposition: [x] ED /[] Urgent Care (no appt availability in office) / [] Appointment(In office/virtual)/ []  [facility]/ [] Home Care/ [] Refused Recommended Disposition /[] [facility]/ []  Follow-up with PCP Additional Notes:    Recommended ED now . Recommended someone else drive.      Copied from CRM [id number]. Topic: Clinical - Red Word Triage >> Feb 05, 2024  8:15 AM Emylou G wrote: [facility] that prompted transfer to Nurse Triage: Lips swelled up.. not sure if it was from BP medicine.. Reason for Disposition  Taking an ACE Inhibitor medicine (e.g., benazepril / LOTENSIN, captopril / CAPOTEN, enalapril / VASOTEC, lisinopril  / ZESTRIL )  Answer Assessment - Initial Assessment Questions 1. ONSET: "When did the swelling start?" (e.g., minutes, hours, days)     This am early morning hours 2. SEVERITY: "How swollen is it?"     Very swollen  3. ITCHING: "Is there any itching?" If Yes, ask: "How much?"   (Scale 1-10; mild, moderate or severe)     No  4. PAIN: "Is the swelling painful to touch?" If Yes, ask: "How painful is it?"   (Scale 1-10; mild, moderate or severe)     No  5. CAUSE: "What do you think is causing the lip swelling?"     Not sure  6. RECURRENT SYMPTOM: "Have you had lip swelling before?" If Yes, ask: "When was the last time?" "What happened that time?"     No  7. OTHER SYMPTOMS: "Do you have any other symptoms?" (e.g., toothache)     Upper lip swelling  8. PREGNANCY: "Is there any chance you are pregnant?" "When was your last  menstrual period?"     na  Protocols used: Lip Swelling-A-AH

## 2024-02-06 NOTE — Telephone Encounter (Signed)
 I would not recommend stopping lisinopril  at this time unless symptoms worsen

## 2024-03-11 ENCOUNTER — Other Ambulatory Visit: Payer: Self-pay | Admitting: Nurse Practitioner

## 2024-03-11 ENCOUNTER — Other Ambulatory Visit: Payer: Self-pay

## 2024-03-11 ENCOUNTER — Telehealth: Payer: Self-pay | Admitting: Nurse Practitioner

## 2024-03-11 DIAGNOSIS — E782 Mixed hyperlipidemia: Secondary | ICD-10-CM

## 2024-03-11 DIAGNOSIS — R7303 Prediabetes: Secondary | ICD-10-CM

## 2024-03-11 DIAGNOSIS — I10 Essential (primary) hypertension: Secondary | ICD-10-CM

## 2024-03-11 MED ORDER — LISINOPRIL-HYDROCHLOROTHIAZIDE 10-12.5 MG PO TABS
1.0000 | ORAL_TABLET | Freq: Every day | ORAL | 0 refills | Status: DC
  Filled 2024-03-11: qty 30, 30d supply, fill #0

## 2024-03-11 MED ORDER — PRAVASTATIN SODIUM 40 MG PO TABS
40.0000 mg | ORAL_TABLET | Freq: Every day | ORAL | 0 refills | Status: DC
  Filled 2024-03-11: qty 30, 30d supply, fill #0

## 2024-03-11 MED ORDER — METFORMIN HCL 500 MG PO TABS
500.0000 mg | ORAL_TABLET | Freq: Every day | ORAL | 0 refills | Status: DC
  Filled 2024-03-11: qty 30, 30d supply, fill #0

## 2024-03-11 NOTE — Telephone Encounter (Unsigned)
 Copied from CRM [id number]. Topic: Clinical - Medication Refill >> Mar 11, 2024 11:47 AM Chrystal Crape R wrote: Medication:  metFORMIN  (GLUCOPHAGE ) 500 MG tablet lisinopril -hydrochlorothiazide  (ZESTORETIC ) 10-12.5 MG table pravastatin  (PRAVACHOL ) 40 MG tablet   Has the patient contacted their pharmacy? Yes, Pharmacy informed pt she's out of refill (Agent: If no, request that the patient contact the pharmacy for the refill. If patient does not wish to contact the pharmacy document the reason why and proceed with request.) (Agent: If yes, when and what did the pharmacy advise?)  This is the patient's preferred pharmacy:  [facility] [address], [address] [place] [address] Phone: [phone] Fax: [phone]  Is this the correct pharmacy for this prescription? Yes If no, delete pharmacy and type the correct one.   Has the prescription been filled recently? Yes  Is the patient out of the medication? Yes  Has the patient been seen for an appointment in the last year OR does the patient have an upcoming appointment? Yes  Can we respond through MyChart? Yes  Agent: Please be advised that Rx refills may take up to 3 business days. We ask that you follow-up with your pharmacy.

## 2024-03-11 NOTE — Telephone Encounter (Signed)
 Medications sent

## 2024-03-29 ENCOUNTER — Other Ambulatory Visit: Payer: Self-pay

## 2024-03-29 ENCOUNTER — Encounter: Payer: Self-pay | Admitting: Nurse Practitioner

## 2024-03-29 ENCOUNTER — Ambulatory Visit: Attending: Nurse Practitioner | Admitting: Nurse Practitioner

## 2024-03-29 ENCOUNTER — Ambulatory Visit: Payer: Self-pay | Admitting: Nurse Practitioner

## 2024-03-29 VITALS — BP 152/97 | HR 84 | Resp 19 | Ht 61.0 in | Wt 181.6 lb

## 2024-03-29 DIAGNOSIS — E559 Vitamin D deficiency, unspecified: Secondary | ICD-10-CM

## 2024-03-29 DIAGNOSIS — R7303 Prediabetes: Secondary | ICD-10-CM | POA: Diagnosis not present

## 2024-03-29 DIAGNOSIS — G629 Polyneuropathy, unspecified: Secondary | ICD-10-CM

## 2024-03-29 DIAGNOSIS — E049 Nontoxic goiter, unspecified: Secondary | ICD-10-CM

## 2024-03-29 DIAGNOSIS — E7800 Pure hypercholesterolemia, unspecified: Secondary | ICD-10-CM

## 2024-03-29 DIAGNOSIS — E041 Nontoxic single thyroid nodule: Secondary | ICD-10-CM

## 2024-03-29 LAB — POCT ABI - SCREENING FOR PILOT NO CHARGE
Left ABI: 1.21
Right ABI: 1.22

## 2024-03-29 MED ORDER — GABAPENTIN 100 MG PO CAPS
100.0000 mg | ORAL_CAPSULE | Freq: Three times a day (TID) | ORAL | 3 refills | Status: DC
  Filled 2024-03-29: qty 90, 30d supply, fill #0
  Filled 2024-05-31 (×2): qty 90, 30d supply, fill #1
  Filled 2024-07-25: qty 90, 30d supply, fill #2
  Filled 2024-10-13 – 2024-10-14 (×2): qty 90, 30d supply, fill #3

## 2024-03-29 NOTE — Patient Instructions (Signed)
 [facility] [address]  [phone]

## 2024-03-29 NOTE — Progress Notes (Signed)
 Assessment & Plan:  Latoya Juarez was seen today for arm pain and leg pain.  Diagnoses and all orders for this visit:  Neuropathy -     gabapentin  (NEURONTIN ) 100 MG capsule; Take 1 capsule (100 mg total) by mouth 3 (three) times daily. -     CBC with Differential -     Iron, TIBC and Ferritin Panel -     Ambulatory referral to Neurology -     POCT ABI Screening Pilot No Charge  Prediabetes -     Hemoglobin A1c  Vitamin D  deficiency disease -     VITAMIN D  25 Hydroxy (Vit-D Deficiency, Fractures)  Hypercholesterolemia -     Lipid panel  Enlarged thyroid  -     US  THYROID ; Future -     Thyroid  Panel With TSH    Patient has been counseled on age-appropriate routine health concerns for screening and prevention. These are reviewed and up-to-date. Referrals have been placed accordingly. Immunizations are up-to-date or declined.    Subjective:   Chief Complaint  Patient presents with   Arm Pain   Leg Pain    Latoya Juarez 53 y.o. female presents to office today with concerns of bilateral arm tingling, numbness and bilateral leg pain.  She has a past medical history of Arthritis, Chronic constipation, Depression, Hemorrhoids, Hyperlipidemia, mixed, Hypertension, IDA, Obesity, Prediabetes, and Uterine leiomyoma.   Blood pressure is elevated today. She is currently prescribed lisinopril -hydrochlorothiazide  10-12.5. Weight is down 19lbs over the past 4 months.  BP Readings from Last 3 Encounters:  03/29/24 (!) 152/97  11/27/23 122/78  11/12/23 106/67     Notes Bilateral Fingertips and bilateral arms tingling. Denies neck pain. Sensation "comes and goes". Occurring intermittently. She has a history of anemia. Not currently taking iron. She is still having extended menstrual cycles and is s/p myomectomy.   She endorses pain in her legs as she does a lot of standing at work. Due to her smoking history ABIs  performed in office today and normal.  She had enlarged thyroid  (Right  lobe) seen on cervical CT 06-26-2022. We will follow up on this today with thyroid  US .    Review of Systems  Constitutional:  Negative for fever, malaise/fatigue and weight loss.  HENT: Negative.  Negative for nosebleeds.   Eyes: Negative.  Negative for blurred vision, double vision and photophobia.  Respiratory: Negative.  Negative for cough and shortness of breath.   Cardiovascular: Negative.  Negative for chest pain, palpitations and leg swelling.  Gastrointestinal: Negative.  Negative for heartburn, nausea and vomiting.  Musculoskeletal:  Positive for joint pain and myalgias.  Neurological:  Positive for tingling and sensory change. Negative for dizziness, focal weakness, seizures and headaches.  Psychiatric/Behavioral: Negative.  Negative for suicidal ideas.     Past Medical History:  Diagnosis Date   Arthritis    knees   Chronic constipation    Depression    Hemorrhoids    Hyperlipidemia, mixed    Hypertension    IDA (iron deficiency anemia)    Obesity    Prediabetes    Uterine leiomyoma     Past Surgical History:  Procedure Laterality Date   CESAREAN SECTION     x5 --- first [id number] ;   last one 11/ 1999  with BILATERAL TUBAL LIGATION   COLONOSCOPY WITH PROPOFOL   11/22/2021   dr Leonia Raman   ROBOT ASSISTED MYOMECTOMY N/A 10/27/2023   Procedure: XI ROBOTIC ASSISTED MYOMECTOMY LYSIS OF ADHESIONS, Fibriod Morcelation, Mini Laparotomy;  Surgeon: Kiki Pelton, MD;  Location: [facility];  Service: Gynecology;  Laterality: N/A;  TAP BLOCK    Family History  Problem Relation Age of Onset   Diabetes Mother    Hypertension Mother    Stroke Mother    Hypertension Brother    Diabetes Brother    Stroke Brother    Heart attack Brother    Breast cancer Maternal Aunt    Colon cancer Neg Hx    Esophageal cancer Neg Hx    Rectal cancer Neg Hx    Stomach cancer Neg Hx     Social History Reviewed with no changes to be made today.   Outpatient Medications  Prior to Visit  Medication Sig Dispense Refill   acetaminophen  (TYLENOL ) 500 MG tablet Take 2 tablets (1,000 mg total) by mouth every 6 (six) hours as needed. 120 tablet 1   ibuprofen  (ADVIL ) 800 MG tablet Take 1 tablet (800 mg total) by mouth 3 (three) times daily with meals as needed for headache, moderate pain (pain score 4-6) or cramping. 90 tablet 1   lisinopril -hydrochlorothiazide  (ZESTORETIC ) 10-12.5 MG tablet Take 1 tablet by mouth daily. 30 tablet 0   metFORMIN  (GLUCOPHAGE ) 500 MG tablet Take 1 tablet (500 mg total) by mouth daily with breakfast. 30 tablet 0   pravastatin  (PRAVACHOL ) 40 MG tablet Take 1 tablet (40 mg total) by mouth daily. FOR CHOLESTEROL 30 tablet 0   diclofenac  Sodium (VOLTAREN ) 1 % GEL Apply 2 g topically 4 (four) times daily. To both knees (Patient taking differently: Apply 2 g topically 4 (four) times daily as needed. To both knees) 200 g 1   fluticasone  (FLONASE ) 50 MCG/ACT nasal spray Place 2 sprays into both nostrils daily. (Patient taking differently: Place 2 sprays into both nostrils daily as needed for allergies or rhinitis.) 16 g 6   ferrous sulfate  325 (65 FE) MG EC tablet Take 325 mg by mouth 3 (three) times daily with meals. (Patient not taking: Reported on 11/12/2023)     ondansetron  (ZOFRAN ) 4 MG tablet Take 1 tablet (4 mg total) by mouth every 8 (eight) hours as needed for nausea or vomiting. (Patient not taking: Reported on 11/12/2023) 20 tablet 0   oxyCODONE  (OXY IR/ROXICODONE ) 5 MG immediate release tablet Take 1 tablet (5 mg total) by mouth every 4 (four) hours as needed for severe pain (pain score 7-10) or breakthrough pain. (Patient not taking: Reported on 11/12/2023) 20 tablet 0   polyethylene glycol (MIRALAX ) 17 g packet Take 17 g by mouth daily. (Patient not taking: Reported on 03/29/2024) 24 each 1   sennosides-docusate sodium  (SENOKOT-S) 8.6-50 MG tablet Take 2 tablets by mouth daily as needed for constipation. (Patient not taking: Reported on  03/29/2024)     simethicone  (MYLICON) 80 MG chewable tablet Chew 1 tablet (80 mg total) by mouth 4 (four) times daily as needed for flatulence. (Patient not taking: Reported on 03/29/2024) 30 tablet 0   No facility-administered medications prior to visit.    Allergies  Allergen Reactions   Hydroxyzine  Other (See Comments)    Nightmares       Objective:    BP (!) 152/97 (BP Location: Left Arm, Patient Position: Sitting, Cuff Size: Normal)   Pulse 84   Resp 19   Ht 5\' 1"  (1.549 m)   Wt 181 lb 9.6 oz (82.4 kg)   LMP 02/22/2024 (Exact Date)   SpO2 100%   BMI 34.31 kg/m  Wt Readings from Last 3 Encounters:  03/29/24  181 lb 9.6 oz (82.4 kg)  11/27/23 200 lb (90.7 kg)  11/12/23 190 lb (86.2 kg)    Physical Exam Vitals and nursing note reviewed.  Constitutional:      Appearance: She is well-developed.  HENT:     Head: Normocephalic and atraumatic.  Cardiovascular:     Rate and Rhythm: Normal rate and regular rhythm.     Heart sounds: Normal heart sounds. No murmur heard.    No friction rub. No gallop.  Pulmonary:     Effort: Pulmonary effort is normal. No tachypnea or respiratory distress.     Breath sounds: Normal breath sounds. No decreased breath sounds, wheezing, rhonchi or rales.  Chest:     Chest wall: No tenderness.  Abdominal:     General: Bowel sounds are normal.     Palpations: Abdomen is soft.  Musculoskeletal:        General: Normal range of motion.     Cervical back: Normal range of motion.  Skin:    General: Skin is warm and dry.  Neurological:     Mental Status: She is alert and oriented to person, place, and time.     Coordination: Coordination normal.  Psychiatric:        Behavior: Behavior normal. Behavior is cooperative.        Thought Content: Thought content normal.        Judgment: Judgment normal.          Patient has been counseled extensively about nutrition and exercise as well as the importance of adherence with medications and  regular follow-up. The patient was given clear instructions to go to ER or return to medical center if symptoms don't improve, worsen or new problems develop. The patient verbalized understanding.   Follow-up: Return in about 3 months (around 06/29/2024).   Collins Dean, FNP-BC [facility] [place], [place] [phone]   03/29/2024, 11:18 PM

## 2024-03-30 ENCOUNTER — Other Ambulatory Visit: Payer: Self-pay

## 2024-03-30 LAB — LIPID PANEL
Chol/HDL Ratio: 2.3 ratio (ref 0.0–4.4)
Cholesterol, Total: 225 mg/dL — ABNORMAL HIGH (ref 100–199)
HDL: 100 mg/dL (ref >39)
LDL Chol Calc (NIH): 110 mg/dL — ABNORMAL HIGH (ref 0–99)
Triglycerides: 88 mg/dL (ref 0–149)
VLDL Cholesterol Cal: 15 mg/dL (ref 5–40)

## 2024-03-30 LAB — CBC WITH DIFFERENTIAL/PLATELET
Basophils Absolute: 0 10*3/uL (ref 0.0–0.2)
Basos: 1 %
EOS (ABSOLUTE): 0.1 10*3/uL (ref 0.0–0.4)
Eos: 1 %
Hematocrit: 37.2 % (ref 34.0–46.6)
Hemoglobin: 11.7 g/dL (ref 11.1–15.9)
Immature Grans (Abs): 0 10*3/uL (ref 0.0–0.1)
Immature Granulocytes: 0 %
Lymphocytes Absolute: 1.3 10*3/uL (ref 0.7–3.1)
Lymphs: 16 %
MCH: 27.5 pg (ref 26.6–33.0)
MCHC: 31.5 g/dL (ref 31.5–35.7)
MCV: 87 fL (ref 79–97)
Monocytes Absolute: 0.7 10*3/uL (ref 0.1–0.9)
Monocytes: 8 %
Neutrophils Absolute: 6.1 10*3/uL (ref 1.4–7.0)
Neutrophils: 74 %
Platelets: 457 10*3/uL — ABNORMAL HIGH (ref 150–450)
RBC: 4.26 x10E6/uL (ref 3.77–5.28)
RDW: 15.8 % — ABNORMAL HIGH (ref 11.7–15.4)
WBC: 8.2 10*3/uL (ref 3.4–10.8)

## 2024-03-30 LAB — IRON,TIBC AND FERRITIN PANEL
Ferritin: 36 ng/mL (ref 15–150)
Iron Saturation: 20 % (ref 15–55)
Iron: 81 ug/dL (ref 27–159)
Total Iron Binding Capacity: 411 ug/dL (ref 250–450)
UIBC: 330 ug/dL (ref 131–425)

## 2024-03-30 LAB — THYROID PANEL WITH TSH
Free Thyroxine Index: 2.5 (ref 1.2–4.9)
T3 Uptake Ratio: 28 % (ref 24–39)
T4, Total: 8.8 ug/dL (ref 4.5–12.0)
TSH: 1.15 u[IU]/mL (ref 0.450–4.500)

## 2024-03-30 LAB — HEMOGLOBIN A1C
Est. average glucose Bld gHb Est-mCnc: 111 mg/dL
Hgb A1c MFr Bld: 5.5 % (ref 4.8–5.6)

## 2024-03-30 LAB — VITAMIN D 25 HYDROXY (VIT D DEFICIENCY, FRACTURES): Vit D, 25-Hydroxy: 20.8 ng/mL — ABNORMAL LOW (ref 30.0–100.0)

## 2024-03-31 ENCOUNTER — Ambulatory Visit
Admission: RE | Admit: 2024-03-31 | Discharge: 2024-03-31 | Disposition: A | Discharge status: Home / Self Care | Source: Ambulatory Visit | Attending: Nurse Practitioner | Admitting: Nurse Practitioner

## 2024-03-31 DIAGNOSIS — E049 Nontoxic goiter, unspecified: Secondary | ICD-10-CM

## 2024-05-02 ENCOUNTER — Observation Stay (HOSPITAL_COMMUNITY)
Admission: EM | Admit: 2024-05-02 | Discharge: 2024-05-04 | Disposition: A | Discharge status: Home / Self Care | Attending: Internal Medicine | Admitting: Internal Medicine

## 2024-05-02 ENCOUNTER — Encounter (HOSPITAL_COMMUNITY): Payer: Self-pay | Admitting: *Deleted

## 2024-05-02 ENCOUNTER — Inpatient Hospital Stay (HOSPITAL_COMMUNITY)

## 2024-05-02 ENCOUNTER — Emergency Department (HOSPITAL_COMMUNITY)

## 2024-05-02 ENCOUNTER — Other Ambulatory Visit: Payer: Self-pay

## 2024-05-02 DIAGNOSIS — J189 Pneumonia, unspecified organism: Principal | ICD-10-CM | POA: Diagnosis present

## 2024-05-02 DIAGNOSIS — I10 Essential (primary) hypertension: Secondary | ICD-10-CM | POA: Insufficient documentation

## 2024-05-02 DIAGNOSIS — R0602 Shortness of breath: Secondary | ICD-10-CM | POA: Diagnosis present

## 2024-05-02 DIAGNOSIS — I5031 Acute diastolic (congestive) heart failure: Principal | ICD-10-CM | POA: Insufficient documentation

## 2024-05-02 DIAGNOSIS — Z6833 Body mass index (BMI) 33.0-33.9, adult: Secondary | ICD-10-CM | POA: Insufficient documentation

## 2024-05-02 DIAGNOSIS — E041 Nontoxic single thyroid nodule: Secondary | ICD-10-CM | POA: Diagnosis not present

## 2024-05-02 DIAGNOSIS — D72829 Elevated white blood cell count, unspecified: Secondary | ICD-10-CM | POA: Insufficient documentation

## 2024-05-02 DIAGNOSIS — Y906 Blood alcohol level of 120-199 mg/100 ml: Secondary | ICD-10-CM | POA: Diagnosis not present

## 2024-05-02 DIAGNOSIS — Z79899 Other long term (current) drug therapy: Secondary | ICD-10-CM | POA: Diagnosis not present

## 2024-05-02 DIAGNOSIS — F1010 Alcohol abuse, uncomplicated: Secondary | ICD-10-CM | POA: Insufficient documentation

## 2024-05-02 DIAGNOSIS — E66811 Obesity, class 1: Secondary | ICD-10-CM | POA: Insufficient documentation

## 2024-05-02 DIAGNOSIS — R7303 Prediabetes: Secondary | ICD-10-CM | POA: Insufficient documentation

## 2024-05-02 DIAGNOSIS — E876 Hypokalemia: Secondary | ICD-10-CM | POA: Insufficient documentation

## 2024-05-02 LAB — BRAIN NATRIURETIC PEPTIDE: B Natriuretic Peptide: 123.5 pg/mL — ABNORMAL HIGH (ref 0.0–100.0)

## 2024-05-02 LAB — PROCALCITONIN: Procalcitonin: <0.1 ng/mL

## 2024-05-02 LAB — HEPATIC FUNCTION PANEL
ALT: 25 U/L (ref 0–44)
AST: 34 U/L (ref 15–41)
Albumin: 3.1 g/dL — ABNORMAL LOW (ref 3.5–5.0)
Alkaline Phosphatase: 86 U/L (ref 38–126)
Bilirubin, Direct: 0.2 mg/dL (ref 0.0–0.2)
Indirect Bilirubin: 1.3 mg/dL — ABNORMAL HIGH (ref 0.3–0.9)
Total Bilirubin: 1.5 mg/dL — ABNORMAL HIGH (ref 0.0–1.2)
Total Protein: 6.8 g/dL (ref 6.5–8.1)

## 2024-05-02 LAB — BASIC METABOLIC PANEL WITH GFR
Anion gap: 15 (ref 5–15)
BUN: <5 mg/dL — ABNORMAL LOW (ref 6–20)
CO2: 21 mmol/L — ABNORMAL LOW (ref 22–32)
Calcium: 8.7 mg/dL — ABNORMAL LOW (ref 8.9–10.3)
Chloride: 103 mmol/L (ref 98–111)
Creatinine, Ser: 0.71 mg/dL (ref 0.44–1.00)
GFR, Estimated: >60 mL/min (ref >60)
Glucose, Bld: 86 mg/dL (ref 70–99)
Potassium: 3.5 mmol/L (ref 3.5–5.1)
Sodium: 139 mmol/L (ref 135–145)

## 2024-05-02 LAB — CBC
HCT: 33.4 % — ABNORMAL LOW (ref 36.0–46.0)
Hemoglobin: 10.7 g/dL — ABNORMAL LOW (ref 12.0–15.0)
MCH: 27.2 pg (ref 26.0–34.0)
MCHC: 32 g/dL (ref 30.0–36.0)
MCV: 84.8 fL (ref 80.0–100.0)
Platelets: 366 K/uL (ref 150–400)
RBC: 3.94 MIL/uL (ref 3.87–5.11)
RDW: 18.2 % — ABNORMAL HIGH (ref 11.5–15.5)
WBC: 11.3 K/uL — ABNORMAL HIGH (ref 4.0–10.5)
nRBC: 0 % (ref 0.0–0.2)

## 2024-05-02 LAB — TROPONIN I (HIGH SENSITIVITY)
Troponin I (High Sensitivity): 41 ng/L — ABNORMAL HIGH (ref <18)
Troponin I (High Sensitivity): 26 ng/L — ABNORMAL HIGH (ref <18)

## 2024-05-02 LAB — I-STAT CG4 LACTIC ACID, ED: Lactic Acid, Venous: 2.6 mmol/L (ref 0.5–1.9)

## 2024-05-02 LAB — ETHANOL: Alcohol, Ethyl (B): 177 mg/dL — ABNORMAL HIGH (ref <15)

## 2024-05-02 LAB — TYPE AND SCREEN
ABO/RH(D): B POS
Antibody Screen: NEGATIVE

## 2024-05-02 LAB — HIV ANTIBODY (ROUTINE TESTING W REFLEX): HIV Screen 4th Generation wRfx: NONREACTIVE

## 2024-05-02 LAB — HCG, SERUM, QUALITATIVE: Preg, Serum: NEGATIVE

## 2024-05-02 LAB — D-DIMER, QUANTITATIVE: D-Dimer, Quant: 1.36 ug{FEU}/mL — ABNORMAL HIGH (ref 0.00–0.50)

## 2024-05-02 LAB — GLUCOSE, CAPILLARY: Glucose-Capillary: 96 mg/dL (ref 70–99)

## 2024-05-02 MED ORDER — SODIUM CHLORIDE 0.9 % IV SOLN
500.0000 mg | Freq: Once | INTRAVENOUS | Status: DC
  Filled 2024-05-02: qty 5

## 2024-05-02 MED ORDER — INSULIN ASPART 100 UNIT/ML IJ SOLN: 0.0000 [IU] | Freq: Three times a day (TID) | INTRAMUSCULAR | Status: DC

## 2024-05-02 MED ORDER — BUDESONIDE 0.25 MG/2ML IN SUSP
0.2500 mg | Freq: Two times a day (BID) | RESPIRATORY_TRACT | Status: DC
  Administered 2024-05-02 – 2024-05-04 (×4): 0.25 mg via RESPIRATORY_TRACT
  Filled 2024-05-02 (×4): qty 2

## 2024-05-02 MED ORDER — HYDRALAZINE HCL 20 MG/ML IJ SOLN
5.0000 mg | Freq: Four times a day (QID) | INTRAMUSCULAR | Status: DC | PRN
  Administered 2024-05-02: 5 mg via INTRAVENOUS
  Filled 2024-05-02: qty 1

## 2024-05-02 MED ORDER — THIAMINE HCL 100 MG/ML IJ SOLN: 100.0000 mg | Freq: Every day | INTRAMUSCULAR | Status: DC

## 2024-05-02 MED ORDER — IOHEXOL 350 MG/ML SOLN
75.0000 mL | Freq: Once | INTRAVENOUS | Status: AC | PRN
  Administered 2024-05-02: 75 mL via INTRAVENOUS

## 2024-05-02 MED ORDER — SODIUM CHLORIDE 0.9 % IV SOLN
500.0000 mg | INTRAVENOUS | Status: DC
  Administered 2024-05-03: 500 mg via INTRAVENOUS
  Filled 2024-05-02 (×2): qty 5

## 2024-05-02 MED ORDER — HEPARIN SODIUM (PORCINE) 5000 UNIT/ML IJ SOLN
5000.0000 [IU] | Freq: Three times a day (TID) | INTRAMUSCULAR | Status: DC
  Administered 2024-05-02: 5000 [IU] via SUBCUTANEOUS
  Filled 2024-05-02 (×3): qty 1

## 2024-05-02 MED ORDER — ACETAMINOPHEN 325 MG PO TABS: 650.0000 mg | ORAL_TABLET | Freq: Four times a day (QID) | ORAL | Status: DC | PRN

## 2024-05-02 MED ORDER — SODIUM CHLORIDE 0.9 % IV SOLN
1.0000 g | Freq: Once | INTRAVENOUS | Status: AC
  Administered 2024-05-02: 1 g via INTRAVENOUS
  Filled 2024-05-02: qty 10

## 2024-05-02 MED ORDER — GUAIFENESIN ER 600 MG PO TB12
600.0000 mg | ORAL_TABLET | Freq: Two times a day (BID) | ORAL | Status: DC | PRN
Start: 2024-05-02 — End: 2024-05-04
  Administered 2024-05-04: 600 mg via ORAL
  Filled 2024-05-02: qty 1

## 2024-05-02 MED ORDER — HYDROCODONE-ACETAMINOPHEN 5-325 MG PO TABS
1.0000 | ORAL_TABLET | ORAL | Status: DC | PRN
  Administered 2024-05-02: 1 via ORAL
  Filled 2024-05-02: qty 1

## 2024-05-02 MED ORDER — THIAMINE MONONITRATE 100 MG PO TABS
100.0000 mg | ORAL_TABLET | Freq: Every day | ORAL | Status: DC
  Administered 2024-05-02 – 2024-05-04 (×3): 100 mg via ORAL
  Filled 2024-05-02 (×3): qty 1

## 2024-05-02 MED ORDER — MORPHINE SULFATE (PF) 2 MG/ML IV SOLN: 2.0000 mg | INTRAVENOUS | Status: DC | PRN

## 2024-05-02 MED ORDER — THIAMINE HCL 100 MG/ML IJ SOLN
100.0000 mg | Freq: Every day | INTRAMUSCULAR | Status: DC
  Filled 2024-05-02 (×2): qty 2

## 2024-05-02 MED ORDER — FUROSEMIDE 10 MG/ML IJ SOLN
20.0000 mg | Freq: Once | INTRAMUSCULAR | Status: AC
  Administered 2024-05-02: 20 mg via INTRAVENOUS
  Filled 2024-05-02: qty 2

## 2024-05-02 MED ORDER — LORAZEPAM 2 MG/ML IJ SOLN: 1.0000 mg | INTRAMUSCULAR | Status: DC | PRN

## 2024-05-02 MED ORDER — LISINOPRIL-HYDROCHLOROTHIAZIDE 10-12.5 MG PO TABS: 1.0000 | ORAL_TABLET | Freq: Every day | ORAL | Status: DC

## 2024-05-02 MED ORDER — HYDROCHLOROTHIAZIDE 12.5 MG PO TABS
12.5000 mg | ORAL_TABLET | Freq: Every day | ORAL | Status: DC
  Administered 2024-05-02 – 2024-05-04 (×3): 12.5 mg via ORAL
  Filled 2024-05-02 (×3): qty 1

## 2024-05-02 MED ORDER — SODIUM CHLORIDE 0.9% FLUSH
3.0000 mL | Freq: Two times a day (BID) | INTRAVENOUS | Status: DC
  Administered 2024-05-02: 10 mL via INTRAVENOUS
  Administered 2024-05-02: 3 mL via INTRAVENOUS
  Administered 2024-05-03 – 2024-05-04 (×2): 10 mL via INTRAVENOUS

## 2024-05-02 MED ORDER — ADULT MULTIVITAMIN W/MINERALS CH
1.0000 | ORAL_TABLET | Freq: Every day | ORAL | Status: DC
  Administered 2024-05-02 – 2024-05-04 (×3): 1 via ORAL
  Filled 2024-05-02 (×3): qty 1

## 2024-05-02 MED ORDER — LORAZEPAM 1 MG PO TABS: 1.0000 mg | ORAL_TABLET | ORAL | Status: DC | PRN

## 2024-05-02 MED ORDER — ALBUTEROL SULFATE (2.5 MG/3ML) 0.083% IN NEBU
2.5000 mg | INHALATION_SOLUTION | Freq: Once | RESPIRATORY_TRACT | Status: AC
  Administered 2024-05-02: 2.5 mg via RESPIRATORY_TRACT
  Filled 2024-05-02: qty 3

## 2024-05-02 MED ORDER — SODIUM CHLORIDE 0.9 % IV SOLN
2.0000 g | INTRAVENOUS | Status: DC
  Administered 2024-05-03 – 2024-05-04 (×2): 2 g via INTRAVENOUS
  Filled 2024-05-02 (×2): qty 20

## 2024-05-02 MED ORDER — PRAVASTATIN SODIUM 40 MG PO TABS
40.0000 mg | ORAL_TABLET | Freq: Every day | ORAL | Status: DC
  Administered 2024-05-02 – 2024-05-04 (×3): 40 mg via ORAL
  Filled 2024-05-02 (×3): qty 1

## 2024-05-02 MED ORDER — LISINOPRIL 10 MG PO TABS
10.0000 mg | ORAL_TABLET | Freq: Every day | ORAL | Status: DC
  Administered 2024-05-02 – 2024-05-04 (×3): 10 mg via ORAL
  Filled 2024-05-02 (×3): qty 1

## 2024-05-02 MED ORDER — FOLIC ACID 1 MG PO TABS
1.0000 mg | ORAL_TABLET | Freq: Every day | ORAL | Status: DC
  Administered 2024-05-02 – 2024-05-04 (×3): 1 mg via ORAL
  Filled 2024-05-02 (×3): qty 1

## 2024-05-02 MED ORDER — PANTOPRAZOLE SODIUM 40 MG IV SOLR
40.0000 mg | Freq: Two times a day (BID) | INTRAVENOUS | Status: DC
  Administered 2024-05-02 – 2024-05-03 (×3): 40 mg via INTRAVENOUS
  Filled 2024-05-02 (×3): qty 10

## 2024-05-02 MED ORDER — ONDANSETRON HCL 4 MG/2ML IJ SOLN
4.0000 mg | Freq: Four times a day (QID) | INTRAMUSCULAR | Status: DC | PRN
  Administered 2024-05-02 – 2024-05-03 (×2): 4 mg via INTRAVENOUS
  Filled 2024-05-02 (×2): qty 2

## 2024-05-02 MED ORDER — IPRATROPIUM-ALBUTEROL 0.5-2.5 (3) MG/3ML IN SOLN
3.0000 mL | Freq: Four times a day (QID) | RESPIRATORY_TRACT | Status: DC | PRN
  Administered 2024-05-02 – 2024-05-03 (×2): 3 mL via RESPIRATORY_TRACT
  Filled 2024-05-02 (×2): qty 3

## 2024-05-02 MED ORDER — SODIUM CHLORIDE 0.9% FLUSH: 3.0000 mL | INTRAVENOUS | Status: DC | PRN

## 2024-05-02 MED ORDER — SODIUM CHLORIDE 0.9 % IV BOLUS
1000.0000 mL | Freq: Once | INTRAVENOUS | Status: AC
  Administered 2024-05-02: 1000 mL via INTRAVENOUS

## 2024-05-02 MED ORDER — GABAPENTIN 100 MG PO CAPS
100.0000 mg | ORAL_CAPSULE | Freq: Three times a day (TID) | ORAL | Status: DC
  Administered 2024-05-02 – 2024-05-04 (×6): 100 mg via ORAL
  Filled 2024-05-02 (×6): qty 1

## 2024-05-02 MED ORDER — ACETAMINOPHEN 650 MG RE SUPP: 650.0000 mg | Freq: Four times a day (QID) | RECTAL | Status: DC | PRN

## 2024-05-02 MED ORDER — DOCUSATE SODIUM 100 MG PO CAPS
100.0000 mg | ORAL_CAPSULE | Freq: Two times a day (BID) | ORAL | Status: DC
  Administered 2024-05-02 – 2024-05-04 (×4): 100 mg via ORAL
  Filled 2024-05-02 (×5): qty 1

## 2024-05-02 NOTE — ED Triage Notes (Signed)
 Patient presents to the ed c/o chest tightness with inspiration  onset sat am states it is getting worse she is hoarse this am. And has no energy., c/o dry cough.

## 2024-05-02 NOTE — H&P (Signed)
 History and Physical    Patient: Latoya Juarez FMW:[id number] DOB: 12-Aug-1971 DOA: 05/02/2024 DOS: the patient was seen and examined on 05/02/2024 . PCP: Theotis Haze ORN, NP  Patient coming from: Home Chief complaint: Chief Complaint  Patient presents with   Chest Pain   HPI:  Latoya Juarez is a 53 y.o. female with past medical history of Alcohol abuse essentially daily vodka last night patient had 2 drinks, essential hypertension, iron deficiency anemia,alcohol abuse, obesity with a BMI of 33.63 /  prediabetes, presenting with chest pain shortness of breath that started this morning and has been worse she also notes hoarseness in her voice and a cough.  ED Course:  Vital signs in the ED were notable for the following:  Vitals:   05/02/24 0956 05/02/24 1028 05/02/24 1200  BP: (!) 162/111  (!) 175/112  Pulse: 97  83  Temp: 99.1 F (37.3 C)    Resp: (!) 25    Height:  5' 1 (1.549 m)   Weight:  80.7 kg   SpO2: 92%  100%  BMI (Calculated):  33.65   >>ED evaluation thus far shows: BMP shows bicarb of 21 BUN less than 5 normal creatinine calcium 8.7 normal potassium anion gap of 15. Magnesium, lactic acid 2.6, LFT, ethanol level, troponin, urine drug screen, D-dimer added on- PENDING. BNP of 123.5 initial troponin of 40 1 repeat at 26. EKG today is sinus rhythm at 95 PR of 134 QRS of 70 QTc 467, diffusely low voltage throughout all leads, significant Q waves in leads III, S wave in lead I and Q wave in lead III, T wave inversion nonspecific but present in lead III. CBC showing white count of 11.3 hemoglobin of 10.7 normal MCV RDW at 18.2 and platelets of 366. Blood cultures collected in the ED. Chest x-ray shows multilobar pneumonia.  >>While in the ED patient received the following: Medications  cefTRIAXone  (ROCEPHIN ) 1 g in sodium chloride  0.9 % 100 mL IVPB (has no administration in time range)  azithromycin  (ZITHROMAX ) 500 mg in sodium chloride  0.9 % 250 mL IVPB (has no  administration in time range)  albuterol  (PROVENTIL ) (2.5 MG/3ML) 0.083% nebulizer solution 2.5 mg (has no administration in time range)  sodium chloride  0.9 % bolus 1,000 mL (1,000 mLs Intravenous New Bag/Given 05/02/24 1253)   Review of Systems  Constitutional:  Positive for malaise/fatigue.  Respiratory:  Positive for cough, shortness of breath and wheezing.   Cardiovascular:  Positive for chest pain.   Past Medical History:  Diagnosis Date   Arthritis    knees   Chronic constipation    Depression    Hemorrhoids    Hyperlipidemia, mixed    Hypertension    IDA (iron deficiency anemia)    Obesity    Prediabetes    Uterine leiomyoma    Past Surgical History:  Procedure Laterality Date   CESAREAN SECTION     x5 --- first [id number] ;   last one 11/ 1999  with BILATERAL TUBAL LIGATION   COLONOSCOPY WITH PROPOFOL   11/22/2021   dr shila   ROBOT ASSISTED MYOMECTOMY N/A 10/27/2023   Procedure: XI ROBOTIC ASSISTED MYOMECTOMY LYSIS OF ADHESIONS, Fibriod Morcelation, Mini Laparotomy;  Surgeon: Jeralyn Crutch, MD;  Location: [facility];  Service: Gynecology;  Laterality: N/A;  TAP BLOCK    reports that she has been smoking cigarettes. She started smoking about 31 years ago. She has a 15.8 pack-year smoking history. She has never used smokeless tobacco. She  reports current alcohol use of about 14.0 - 21.0 standard drinks of alcohol per week. She reports current drug use. Drug: Marijuana. Allergies  Allergen Reactions   Hydroxyzine  Other (See Comments)    Nightmares   Family History  Problem Relation Age of Onset   Diabetes Mother    Hypertension Mother    Stroke Mother    Hypertension Brother    Diabetes Brother    Stroke Brother    Heart attack Brother    Breast cancer Maternal Aunt    Colon cancer Neg Hx    Esophageal cancer Neg Hx    Rectal cancer Neg Hx    Stomach cancer Neg Hx    Prior to Admission medications   Medication Sig Start Date End Date  Taking? Authorizing Provider  acetaminophen  (TYLENOL ) 500 MG tablet Take 2 tablets (1,000 mg total) by mouth every 6 (six) hours as needed. 10/27/23   Ajewole, Christana, MD  ferrous sulfate  325 (65 FE) MG EC tablet Take 325 mg by mouth 3 (three) times daily with meals. Patient not taking: Reported on 11/12/2023    [provider]  gabapentin  (NEURONTIN ) 100 MG capsule Take 1 capsule (100 mg total) by mouth 3 (three) times daily. 03/29/24   Fleming, Zelda W, NP  ibuprofen  (ADVIL ) 800 MG tablet Take 1 tablet (800 mg total) by mouth 3 (three) times daily with meals as needed for headache, moderate pain (pain score 4-6) or cramping. 10/27/23   Ajewole, Christana, MD  lisinopril -hydrochlorothiazide  (ZESTORETIC ) 10-12.5 MG tablet Take 1 tablet by mouth daily. 03/11/24   Fleming, Zelda W, NP  metFORMIN  (GLUCOPHAGE ) 500 MG tablet Take 1 tablet (500 mg total) by mouth daily with breakfast. 03/11/24   Fleming, Zelda W, NP  pravastatin  (PRAVACHOL ) 40 MG tablet Take 1 tablet (40 mg total) by mouth daily. FOR CHOLESTEROL 03/11/24   Theotis Haze ORN, NP                                                                                 Vitals:   05/02/24 0956 05/02/24 1028 05/02/24 1200  BP: (!) 162/111  (!) 175/112  Pulse: 97  83  Resp: (!) 25    Temp: 99.1 F (37.3 C)    SpO2: 92%  100%  Weight:  80.7 kg   Height:  5' 1 (1.549 m)    Physical Exam Vitals and nursing note reviewed.  Constitutional:      General: She is not in acute distress. HENT:     Head: Normocephalic and atraumatic.     Right Ear: Hearing normal.     Left Ear: Hearing normal.     Nose: No nasal deformity.     Mouth/Throat:     Lips: Pink.  Eyes:     General: Lids are normal.     Extraocular Movements: Extraocular movements intact.  Cardiovascular:     Rate and Rhythm: Normal rate and regular rhythm.     Heart sounds: Normal heart sounds.  Pulmonary:     Effort: Pulmonary effort is normal.     Breath sounds: Examination  of the right-upper field reveals wheezing. Examination of the left-upper field reveals wheezing. Examination of the  right-middle field reveals wheezing. Examination of the left-middle field reveals wheezing. Examination of the right-lower field reveals wheezing. Examination of the left-lower field reveals wheezing. Wheezing present.  Abdominal:     General: Bowel sounds are normal. There is no distension.     Palpations: Abdomen is soft. There is no mass.     Tenderness: There is no abdominal tenderness.  Musculoskeletal:     Right lower leg: No edema.     Left lower leg: No edema.  Skin:    General: Skin is warm.  Neurological:     General: No focal deficit present.     Mental Status: She is alert and oriented to person, place, and time.     Cranial Nerves: Cranial nerves 2-12 are intact.  Psychiatric:        Speech: Speech normal.     Labs on Admission: I have personally reviewed following labs and imaging studies CBC: Recent Labs  Lab 05/02/24 1034  WBC 11.3*  HGB 10.7*  HCT 33.4*  MCV 84.8  PLT 366   Basic Metabolic Panel: Recent Labs  Lab 05/02/24 1034  NA 139  K 3.5  CL 103  CO2 21*  GLUCOSE 86  BUN <5*  CREATININE 0.71  CALCIUM 8.7*   GFR: Estimated Creatinine Clearance: 79.2 mL/min (by C-G formula based on SCr of 0.71 mg/dL). Liver Function Tests: No results for input(s): AST, ALT, ALKPHOS, BILITOT, PROT, ALBUMIN in the last 168 hours. No results for input(s): LIPASE, AMYLASE in the last 168 hours. No results for input(s): AMMONIA in the last 168 hours. Coagulation Profile: No results for input(s): INR, PROTIME in the last 168 hours. Cardiac Enzymes: No results for input(s): CKTOTAL, CKMB, CKMBINDEX, TROPONINI in the last 168 hours. BNP (last 3 results) No results for input(s): PROBNP in the last 8760 hours. HbA1C: No results for input(s): HGBA1C in the last 72 hours. CBG: No results for input(s): GLUCAP in the last  168 hours. Lipid Profile: No results for input(s): CHOL, HDL, LDLCALC, TRIG, CHOLHDL, LDLDIRECT in the last 72 hours. Thyroid  Function Tests: No results for input(s): TSH, T4TOTAL, FREET4, T3FREE, THYROIDAB in the last 72 hours. Anemia Panel: No results for input(s): VITAMINB12, FOLATE, FERRITIN, TIBC, IRON, RETICCTPCT in the last 72 hours. Urine analysis:    Component Value Date/Time   COLORURINE YELLOW 05/11/2008 1008   APPEARANCEUR CLEAR 05/11/2008 1008   LABSPEC >=1.030 07/13/2010 1425   PHURINE 5.5 07/13/2010 1425   GLUCOSEU NEGATIVE 07/13/2010 1425   HGBUR NEGATIVE 07/13/2010 1425   BILIRUBINUR NEGATIVE 07/13/2010 1425   KETONESUR NEGATIVE 07/13/2010 1425   PROTEINUR NEGATIVE 07/13/2010 1425   UROBILINOGEN 0.2 07/13/2010 1425   NITRITE NEGATIVE 07/13/2010 1425   LEUKOCYTESUR  07/13/2010 1425    NEGATIVE Biochemical Testing Only. Please order routine urinalysis from main lab if confirmatory testing is needed.   Radiological Exams on Admission: DG Chest 2 View Result Date: 05/02/2024 CLINICAL DATA:  Shortness of breath.  Chest pain. EXAM: CHEST - 2 VIEW COMPARISON:  02/16/2014. FINDINGS: Low lung volume. There are diffuse heterogeneous alveolar and interstitial opacities throughout bilateral lungs with asymmetric more involvement of right lung lower lung zone and asymmetric less involvement of left lower lung zone, favored to represent multilobar pneumonia. Follow-up to clearing is recommended. Bilateral costophrenic angles are clear. Normal cardio-mediastinal silhouette. No acute osseous abnormalities. The soft tissues are within normal limits. IMPRESSION: Findings favor multilobar pneumonia. Follow-up to clearing is recommended. Electronically Signed   By: Ree Kimberlee HERO.D.  On: 05/02/2024 11:07   Data Reviewed: Relevant notes from primary care and specialist visits, past discharge summaries as available in EHR, including Care Everywhere . Prior  diagnostic testing as pertinent to current admission diagnoses, Updated medications and problem lists for reconciliation .ED course, including vitals, labs, imaging, treatment and response to treatment,Triage notes, nursing and pharmacy notes and ED provider's notes.Notable results as noted in HPI.Discussed case with EDMD/ ED APP/ or Specialty MD on call and as needed.  Assessment & Plan  >> Chest pain/cough: EKG is nonischemic although abnormal with nonspecific findings of Q waves no ST elevation depression or T wave inversion.  Troponins mildly elevated at 41 and repeat recheck at 26 suspect from sepsis related rise.  BNP at 123.5.  Clinically patient is euvolemic and has no JVD or lower extremity edema.   >> Alcohol abuse: Last drink was last night, thiamine , CIWA protocol, aspiration and fall and alcohol withdrawal precautions and protocol.   >> Sepsis secondary to pneumonia: Attributed to community-acquired pneumonia although risk of aspiration is also there with possibility of patient and her alcohol abuse as etiology.  Continue with IV antibiotics Rocephin  azithromycin  and sepsis protocol.  Low threshold for CTA. Lactic acidosis of 2.6 suspect from sepsis as well as alcohol abuse.Hold metformin . Inhaled Pulmicort  and DuoNebs with as needed albuterol .  Incentive spirometer.  >> Essential hypertension: Vitals:   05/02/24 0956 05/02/24 1200  BP: (!) 162/111 (!) 175/112  As needed hydralazine . Home regimen consists of lisinopril  HCTZ which we will continue.   >> Anemia:    Latest Ref Rng & Units 05/02/2024   10:34 AM 03/29/2024   11:50 AM 11/05/2023   11:59 AM  CBC  WBC 4.0 - 10.5 K/uL 11.3  8.2  9.7   Hemoglobin 12.0 - 15.0 g/dL 89.2  88.2  87.1   Hematocrit 36.0 - 46.0 % 33.4  37.2  37.6   Platelets 150 - 400 K/uL 366  457  590   Hemoglobin of 10.7 as above patient has a history of IDA.  Will type and screen, IV PPI, stool occult.  Anemia panel.  Recent colonoscopy was in  February 2023 with  GI which was showing hemorrhoids otherwise normal-please see complete report.    >> Obesity with a BMI of 33.63/prediabetes: A1c is well-controlled we will continue with sliding scale insulin  regimen per glycemic protocol.   DVT prophylaxis:  SCDs Consults:  None  Advance Care Planning:    Code Status: Full Code   Family Communication:  None Disposition Plan:  Home Severity of Illness: The appropriate patient status for this patient is INPATIENT. Inpatient status is judged to be reasonable and necessary in order to provide the required intensity of service to ensure the patient's safety. The patient's presenting symptoms, physical exam findings, and initial radiographic and laboratory data in the context of their chronic comorbidities is felt to place them at high risk for further clinical deterioration. Furthermore, it is not anticipated that the patient will be medically stable for discharge from the hospital within 2 midnights of admission.   * I certify that at the point of admission it is my clinical judgment that the patient will require inpatient hospital care spanning beyond 2 midnights from the point of admission due to high intensity of service, high risk for further deterioration and high frequency of surveillance required.*  Unresulted Labs (From admission, onward)     Start     Ordered   05/02/24 1416  HIV Antibody (routine testing  w rflx)  (HIV Antibody (Routine testing w reflex) panel)  Once,   R       Question:  Specimen collection method  Answer:  IV Team=IV Team collect   05/02/24 1418   05/02/24 1416  Procalcitonin  (COPD / Pneumonia / Cellulitis / Lower Extremity Wound (Diabetic Foot Infection))  Once,   R       References:    Procalcitonin Lower Respiratory Tract Infection AND Sepsis Procalcitonin Algorithm  Question:  Specimen collection method  Answer:  IV Team=IV Team collect   05/02/24 1418   05/02/24 1309  Rapid urine drug screen  (hospital performed)  Add-on,   AD        05/02/24 1309   05/02/24 1308  Hepatic function panel  Add-on,   AD        05/02/24 1309   05/02/24 1308  Type and screen  Once,   STAT        05/02/24 1309   05/02/24 1307  Ethanol  Add-on,   AD        05/02/24 1309   05/02/24 1159  Culture, blood (routine x 2)  BLOOD CULTURE X 2,   R      05/02/24 1158            Meds ordered this encounter  Medications   sodium chloride  0.9 % bolus 1,000 mL   cefTRIAXone  (ROCEPHIN ) 1 g in sodium chloride  0.9 % 100 mL IVPB    Antibiotic Indication::   CAP   azithromycin  (ZITHROMAX ) 500 mg in sodium chloride  0.9 % 250 mL IVPB   albuterol  (PROVENTIL ) (2.5 MG/3ML) 0.083% nebulizer solution 2.5 mg   DISCONTD: thiamine  (VITAMIN B1) injection 100 mg   pantoprazole  (PROTONIX ) injection 40 mg   hydrALAZINE  (APRESOLINE ) injection 5 mg   OR Linked Order Group    LORazepam  (ATIVAN ) tablet 1-4 mg     CIWA-AR < 5 =:   0 mg     CIWA-AR 5 -10 =:   1 mg     CIWA-AR 11 -15 =:   2 mg     CIWA-AR 16 -20 =:   3 mg     CIWA-AR 16 -20 =:   Recheck CIWA-AR in 1 hour; if > 20 notify MD     CIWA-AR > 20 =:   4 mg     CIWA-AR > 20 =:   Call Rapid Response    LORazepam  (ATIVAN ) injection 1-4 mg     CIWA-AR < 5 =:   0 mg     CIWA-AR 5 -10 =:   1 mg     CIWA-AR 11 -15 =:   2 mg     CIWA-AR 16 -20 =:   3 mg     CIWA-AR 16 -20 =:   Recheck CIWA-AR in 1 hour; if > 20 notify MD     CIWA-AR > 20 =:   4 mg     CIWA-AR > 20 =:   Call Rapid Response   OR Linked Order Group    thiamine  (VITAMIN B1) tablet 100 mg    thiamine  (VITAMIN B1) injection 100 mg   folic acid  (FOLVITE ) tablet 1 mg   multivitamin with minerals tablet 1 tablet   budesonide  (PULMICORT ) nebulizer solution 0.25 mg   ipratropium-albuterol  (DUONEB) 0.5-2.5 (3) MG/3ML nebulizer solution 3 mL   cefTRIAXone  (ROCEPHIN ) 2 g in sodium chloride  0.9 % 100 mL IVPB    Antibiotic Indication::   CAP   azithromycin  (  ZITHROMAX ) 500 mg in sodium chloride  0.9 % 250 mL IVPB     Antibiotic Indication::   CAP   OR Linked Order Group    acetaminophen  (TYLENOL ) tablet 650 mg    acetaminophen  (TYLENOL ) suppository 650 mg   morphine  (PF) 2 MG/ML injection 2 mg   docusate sodium  (COLACE) capsule 100 mg   guaiFENesin  (MUCINEX ) 12 hr tablet 600 mg   sodium chloride  flush (NS) 0.9 % injection 3-10 mL   sodium chloride  flush (NS) 0.9 % injection 3-10 mL   heparin  injection 5,000 Units   insulin  aspart (novoLOG ) injection 0-9 Units    Correction coverage::   Sensitive (thin, NPO, renal)    CBG < 70::   Implement Hypoglycemia Standing Orders and refer to Hypoglycemia Standing Orders sidebar report    CBG 70 - 120::   0 units    CBG 121 - 150::   1 unit    CBG 151 - 200::   2 units    CBG 201 - 250::   3 units    CBG 251 - 300::   5 units    CBG 301 - 350::   7 units    CBG 351 - 400:   9 units    CBG > 400:   call MD and obtain STAT lab verification   HYDROcodone -acetaminophen  (NORCO/VICODIN) 5-325 MG per tablet 1-2 tablet    Refill:  0   gabapentin  (NEURONTIN ) capsule 100 mg   lisinopril -hydrochlorothiazide  (ZESTORETIC ) 10-12.5 MG per tablet 1 tablet   pravastatin  (PRAVACHOL ) tablet 40 mg    FOR CHOLESTEROL       Orders Placed This Encounter  Procedures   Culture, blood (routine x 2)   DG Chest 2 View   Basic metabolic panel   CBC   hCG, serum, qualitative   Brain natriuretic peptide   Ethanol   Hepatic function panel   Rapid urine drug screen (hospital performed)   HIV Antibody (routine testing w rflx)   Procalcitonin   Diet heart healthy/carb modified Room service appropriate? Yes; Fluid consistency: Thin   Document Height and Actual Weight   Clinical institute withdrawal assessment   Vital signs every 6 hours X 48 hours, then per unit protocol   Refer to Sidebar Report for reference: ETOH Withdrawal Guidelines   Clinical Institute Withdrawal Assessent (CIWA)   If Ativan  given, reassess Clinical Institute Withdrawal Assessment (CIWA) with blood  pressure and pulse rate within 1 hour of Ativan  administration   Notify Pharmacy to change IV Ativan  to PO if tolerating POs well.   Notify physician (specify)   Apply Diabetes Mellitus Care Plan   STAT CBG when hypoglycemia is suspected. If treated, recheck every 15 minutes after each treatment until CBG >/= 70 mg/dl   Refer to Hypoglycemia Protocol Sidebar Report for treatment of CBG < 70 mg/dl   No HS correction Insulin    Apply Pneumonia Care Plan   Cardiac Monitoring Continuous x 24 hours Indications for use: Other; other indications for use: PNA   Vital signs   Notify physician (specify)   Mobility Protocol: No Restrictions RN to initiate protocols based on patient's level of care   Refer to Sidebar Report Refer to ICU, Med-Surg, Progressive, and Step-Down Mobility Protocol Sidebars   Initiate Adult Central Line Maintenance and Catheter Protocol for patients with central line (CVC, PICC, Port, Hemodialysis, Trialysis)   If patient diabetic or glucose greater than 140 notify physician for Sliding Scale Insulin  Orders   Intake and Output  Do not place and if present remove PureWick   Initiate Oral Care Protocol   Initiate Carrier Fluid Protocol   RN may order General Admission PRN Orders utilizing General Admission PRN medications (through manage orders) for the following patient needs: allergy symptoms (Claritin), cold sores (Carmex), cough (Robitussin DM), eye irritation (Liquifilm Tears), hemorrhoids (Tucks), indigestion (Maalox), minor skin irritation (Hydrocortisone Cream), muscle pain Lucienne Gay), nose irritation (saline nasal spray) and sore throat (Chloraseptic spray).   Full code   Consult for Unassigned Medical Admission   Consult to Transition of Care Team   Consult to respiratory care treatment (RT)   Incentive spirometry RT   Pulse oximetry check with vital signs   Oxygen therapy Mode or (Route): Nasal cannula; Liters Per Minute: 2; Keep O2 saturation between: greater than  92 %   Incentive spirometry   I-Stat Lactic Acid   ED EKG   EKG 12-Lead   Type and screen   Saline lock IV   Admit to Inpatient (patient's expected length of stay will be greater than 2 midnights or inpatient only procedure)   Aspiration precautions   Fall precautions    Author: Mario LULLA Blanch, MD 12 pm- 8 pm. [facility]. 05/02/2024 2:20 PM Please note for any communication after hours contact [facility] Assigned provider on call on Amion.

## 2024-05-02 NOTE — Progress Notes (Signed)
 Patient refused tele, MD was made aware at 1830

## 2024-05-02 NOTE — Discharge Instructions (Signed)

## 2024-05-02 NOTE — ED Provider Notes (Signed)
 [facility] Provider Note   CSN: [id number] Arrival date & time: 05/02/24  9046     Patient presents with: Chest Pain   Latoya Juarez is a 53 y.o. female.   HPI 53 year old female presents with cough and shortness of breath.  Symptoms are progressively worsening and she woke up very short of breath this morning.  When she was walking she felt like she was going to pass out.  She is having chest tightness and dizziness.  She has heard some wheezing and has had a nonproductive cough and hoarse voice.  She feels like both of her lower calves have been swollen though these are improved currently.  No recent travel or surgery.  No fevers.  Prior to Admission medications   Medication Sig Start Date End Date Taking? Authorizing Provider  acetaminophen  (TYLENOL ) 500 MG tablet Take 2 tablets (1,000 mg total) by mouth every 6 (six) hours as needed. 10/27/23   Ajewole, Christana, MD  ferrous sulfate  325 (65 FE) MG EC tablet Take 325 mg by mouth 3 (three) times daily with meals. Patient not taking: Reported on 11/12/2023    [provider]  gabapentin  (NEURONTIN ) 100 MG capsule Take 1 capsule (100 mg total) by mouth 3 (three) times daily. 03/29/24   Fleming, Zelda W, NP  ibuprofen  (ADVIL ) 800 MG tablet Take 1 tablet (800 mg total) by mouth 3 (three) times daily with meals as needed for headache, moderate pain (pain score 4-6) or cramping. 10/27/23   Ajewole, Christana, MD  lisinopril -hydrochlorothiazide  (ZESTORETIC ) 10-12.5 MG tablet Take 1 tablet by mouth daily. 03/11/24   Fleming, Zelda W, NP  metFORMIN  (GLUCOPHAGE ) 500 MG tablet Take 1 tablet (500 mg total) by mouth daily with breakfast. 03/11/24   Fleming, Zelda W, NP  pravastatin  (PRAVACHOL ) 40 MG tablet Take 1 tablet (40 mg total) by mouth daily. FOR CHOLESTEROL 03/11/24   Theotis Haze ORN, NP    Allergies: Hydroxyzine     Review of Systems  Constitutional:  Negative for fever.  Respiratory:   Positive for cough, chest tightness, shortness of breath and wheezing.     Updated Vital Signs BP (!) 175/112   Pulse 83   Temp 99.1 F (37.3 C)   Resp (!) 25   Ht 5' 1 (1.549 m)   Wt 80.7 kg   SpO2 100%   BMI 33.63 kg/m   Physical Exam Vitals and nursing note reviewed.  Constitutional:      Appearance: She is well-developed. She is not diaphoretic.  HENT:     Head: Normocephalic and atraumatic.  Cardiovascular:     Rate and Rhythm: Normal rate and regular rhythm.     Heart sounds: Normal heart sounds.  Pulmonary:     Effort: Pulmonary effort is normal. Tachypnea present. No accessory muscle usage.     Breath sounds: Normal breath sounds.  Abdominal:     Palpations: Abdomen is soft.     Tenderness: There is no abdominal tenderness.  Musculoskeletal:     Right lower leg: No tenderness. No edema.     Left lower leg: No tenderness. No edema.  Skin:    General: Skin is warm and dry.  Neurological:     Mental Status: She is alert.     (all labs ordered are listed, but only abnormal results are displayed) Labs Reviewed  BASIC METABOLIC PANEL WITH GFR - Abnormal; Notable for the following components:      Result Value   CO2  21 (*)    BUN <5 (*)    Calcium 8.7 (*)    All other components within normal limits  CBC - Abnormal; Notable for the following components:   WBC 11.3 (*)    Hemoglobin 10.7 (*)    HCT 33.4 (*)    RDW 18.2 (*)    All other components within normal limits  BRAIN NATRIURETIC PEPTIDE - Abnormal; Notable for the following components:   B Natriuretic Peptide 123.5 (*)    All other components within normal limits  ETHANOL - Abnormal; Notable for the following components:   Alcohol, Ethyl (B) 177 (*)    All other components within normal limits  I-STAT CG4 LACTIC ACID, ED - Abnormal; Notable for the following components:   Lactic Acid, Venous 2.6 (*)    All other components within normal limits  TROPONIN I (HIGH SENSITIVITY) - Abnormal; Notable  for the following components:   Troponin I (High Sensitivity) 41 (*)    All other components within normal limits  TROPONIN I (HIGH SENSITIVITY) - Abnormal; Notable for the following components:   Troponin I (High Sensitivity) 26 (*)    All other components within normal limits  CULTURE, BLOOD (ROUTINE X 2)  CULTURE, BLOOD (ROUTINE X 2)  HCG, SERUM, QUALITATIVE  HEPATIC FUNCTION PANEL  RAPID URINE DRUG SCREEN, HOSP PERFORMED  HIV ANTIBODY (ROUTINE TESTING W REFLEX)  PROCALCITONIN  D-DIMER, QUANTITATIVE  I-STAT CG4 LACTIC ACID, ED  TYPE AND SCREEN    EKG: EKG Interpretation Date/Time:  Monday May 02 2024 10:35:01 EDT Ventricular Rate:  95 PR Interval:  134 QRS Duration:  70 QT Interval:  372 QTC Calculation: 467 R Axis:   53  Text Interpretation: Normal sinus rhythm Low voltage QRS Possible Inferior infarct , age undetermined When compared with ECG of 22-Oct-2023 11:11, PREVIOUS ECG IS PRESENT No significant change since last tracing Confirmed by Doretha Folks ([id number]) on 05/02/2024 10:46:49 AM  Radiology: [facility] Chest 2 View Result Date: 05/02/2024 CLINICAL DATA:  Shortness of breath.  Chest pain. EXAM: CHEST - 2 VIEW COMPARISON:  02/16/2014. FINDINGS: Low lung volume. There are diffuse heterogeneous alveolar and interstitial opacities throughout bilateral lungs with asymmetric more involvement of right lung lower lung zone and asymmetric less involvement of left lower lung zone, favored to represent multilobar pneumonia. Follow-up to clearing is recommended. Bilateral costophrenic angles are clear. Normal cardio-mediastinal silhouette. No acute osseous abnormalities. The soft tissues are within normal limits. IMPRESSION: Findings favor multilobar pneumonia. Follow-up to clearing is recommended. Electronically Signed   By: Ree Molt M.D.   On: 05/02/2024 11:07     .Critical Care  Performed by: Freddi Hamilton, MD Authorized by: Freddi Hamilton, MD   Critical care provider  statement:    Critical care time (minutes):  30   Critical care time was exclusive of:  Separately billable procedures and treating other patients   Critical care was necessary to treat or prevent imminent or life-threatening deterioration of the following conditions:  Sepsis   Critical care was time spent personally by me on the following activities:  Development of treatment plan with patient or surrogate, discussions with consultants, evaluation of patient's response to treatment, examination of patient, ordering and review of laboratory studies, ordering and review of radiographic studies, ordering and performing treatments and interventions, pulse oximetry, re-evaluation of patient's condition and review of old charts    Medications Ordered in the ED  azithromycin  (ZITHROMAX ) 500 mg in sodium chloride  0.9 % 250 mL IVPB (has  no administration in time range)  pantoprazole  (PROTONIX ) injection 40 mg (40 mg Intravenous Given 05/02/24 1548)  hydrALAZINE  (APRESOLINE ) injection 5 mg (has no administration in time range)  LORazepam  (ATIVAN ) tablet 1-4 mg (has no administration in time range)    Or  LORazepam  (ATIVAN ) injection 1-4 mg (has no administration in time range)  thiamine  (VITAMIN B1) tablet 100 mg (100 mg Oral Given 05/02/24 1546)    Or  thiamine  (VITAMIN B1) injection 100 mg ( Intravenous See Alternative 05/02/24 1546)  folic acid  (FOLVITE ) tablet 1 mg (1 mg Oral Given 05/02/24 1546)  multivitamin with minerals tablet 1 tablet (1 tablet Oral Given 05/02/24 1546)  budesonide  (PULMICORT ) nebulizer solution 0.25 mg (has no administration in time range)  ipratropium-albuterol  (DUONEB) 0.5-2.5 (3) MG/3ML nebulizer solution 3 mL (has no administration in time range)  cefTRIAXone  (ROCEPHIN ) 2 g in sodium chloride  0.9 % 100 mL IVPB (has no administration in time range)  azithromycin  (ZITHROMAX ) 500 mg in sodium chloride  0.9 % 250 mL IVPB (has no administration in time range)  acetaminophen   (TYLENOL ) tablet 650 mg (has no administration in time range)    Or  acetaminophen  (TYLENOL ) suppository 650 mg (has no administration in time range)  morphine  (PF) 2 MG/ML injection 2 mg (has no administration in time range)  docusate sodium  (COLACE) capsule 100 mg (100 mg Oral Given 05/02/24 1546)  guaiFENesin  (MUCINEX ) 12 hr tablet 600 mg (has no administration in time range)  sodium chloride  flush (NS) 0.9 % injection 3-10 mL (10 mLs Intravenous Given 05/02/24 1549)  sodium chloride  flush (NS) 0.9 % injection 3-10 mL (has no administration in time range)  heparin  injection 5,000 Units (5,000 Units Subcutaneous Given 05/02/24 1547)  insulin  aspart (novoLOG ) injection 0-9 Units (has no administration in time range)  HYDROcodone -acetaminophen  (NORCO/VICODIN) 5-325 MG per tablet 1-2 tablet (has no administration in time range)  gabapentin  (NEURONTIN ) capsule 100 mg (100 mg Oral Given 05/02/24 1549)  pravastatin  (PRAVACHOL ) tablet 40 mg (40 mg Oral Given 05/02/24 1546)  lisinopril  (ZESTRIL ) tablet 10 mg (10 mg Oral Given 05/02/24 1546)    And  hydrochlorothiazide  (HYDRODIURIL ) tablet 12.5 mg (12.5 mg Oral Given 05/02/24 1546)  sodium chloride  0.9 % bolus 1,000 mL (1,000 mLs Intravenous New Bag/Given 05/02/24 1253)  cefTRIAXone  (ROCEPHIN ) 1 g in sodium chloride  0.9 % 100 mL IVPB (1 g Intravenous New Bag/Given 05/02/24 1430)  albuterol  (PROVENTIL ) (2.5 MG/3ML) 0.083% nebulizer solution 2.5 mg (2.5 mg Nebulization Given 05/02/24 1309)                                    Medical Decision Making Amount and/or Complexity of Data Reviewed Labs: ordered.    Details: Leukocytosis and elevated lactate at 2.6. Radiology: ordered and independent interpretation performed.    Details: Pneumonia ECG/medicine tests: ordered and independent interpretation performed.    Details: No ischemia  Risk Prescription drug management. Decision regarding hospitalization.   Patient presents with pneumonia.  Found to  have elevated lactate and leukocytosis.  Overall she is not hypotensive but does have some increased work of breathing.  I think with this she will need IV antibiotics and fluids and supportive care.  She does not have any signs of septic shock.  She does have a troponin leak and some chest pain but I suspect this is secondary to the pneumonia.  Discussed with Dr. Tobie for admission.     Final diagnoses:  Multifocal pneumonia    ED Discharge Orders     None          Freddi Hamilton, MD 05/02/24 1600

## 2024-05-02 NOTE — Progress Notes (Signed)
 [facility] night cross cover note:  I added prn iv zofran  for pt's report of nausea.    Eva Pore, DO Hospitalist

## 2024-05-02 NOTE — Subjective & Objective (Signed)
 Patient presented to ER with chest tightness hoarseness and shortness of breath decreased energy dry cough. Has been getting progressively worse Feels dizzy and wheezing No fevers or chills Feels like her shortness of breath gotten worse today in the morning

## 2024-05-02 NOTE — Progress Notes (Signed)
 CSW added substance abuse resources to patient's AVS.  Edwin Dada, MSW, LCSW Transitions of Care  Clinical Social Worker II [phone]

## 2024-05-02 NOTE — Progress Notes (Addendum)
 [facility] night cross cover note:   I was notified by the patient's RN that, in spite of presenting with chest pain and shortness of breath, this patient is refusing telemetry monitoring in spite of attempts at education to the importance of overnight telemetry monitoring given her presentation.  Patient also refusing to sq tid heparin , which was ordered for DVT prophylaxis.  In light of this, I have added SCDs for DVT prophylaxis.    Update: I followed-up on the results of this patient's CTA chest with PE protocol, which showed no evidence of acute pulmonary embolism, but showed some smooth interlobular septal thickening and patchy groundglass opacities throughout both lungs with central predominance, which, per radiology read, was felt to be compatible with pulmonary edema, while also demonstrating small bilateral pleural effusions in the absence of any evidence of infiltrate or pneumothorax.  Given the results of the CTA chest as well as a notably elevated BNP of 123 in this patient with a BMI of 33.6, I have ordered a dose of iv lasix  and also ordered echocardiogram for the AM in addition to a repeat BNP to be with tomorrow AM's labs.      Eva Pore, DO Hospitalist

## 2024-05-03 ENCOUNTER — Inpatient Hospital Stay (HOSPITAL_BASED_OUTPATIENT_CLINIC_OR_DEPARTMENT_OTHER)

## 2024-05-03 DIAGNOSIS — J189 Pneumonia, unspecified organism: Secondary | ICD-10-CM | POA: Diagnosis not present

## 2024-05-03 DIAGNOSIS — I5031 Acute diastolic (congestive) heart failure: Secondary | ICD-10-CM

## 2024-05-03 LAB — ECHOCARDIOGRAM COMPLETE
AR max vel: 2.18 cm2
AV Area VTI: 2.37 cm2
AV Area mean vel: 2.47 cm2
AV Mean grad: 5 mmHg
AV Peak grad: 10.4 mmHg
Ao pk vel: 1.61 m/s
Area-P 1/2: 3.23 cm2
Height: 61 in
S' Lateral: 2.6 cm
Weight: 2848 [oz_av]

## 2024-05-03 LAB — BRAIN NATRIURETIC PEPTIDE: B Natriuretic Peptide: 268.3 pg/mL — ABNORMAL HIGH (ref 0.0–100.0)

## 2024-05-03 LAB — GLUCOSE, CAPILLARY
Glucose-Capillary: 115 mg/dL — ABNORMAL HIGH (ref 70–99)
Glucose-Capillary: 123 mg/dL — ABNORMAL HIGH (ref 70–99)

## 2024-05-03 MED ORDER — FUROSEMIDE 20 MG PO TABS
20.0000 mg | ORAL_TABLET | Freq: Every day | ORAL | Status: DC
  Administered 2024-05-04: 20 mg via ORAL
  Filled 2024-05-03: qty 1

## 2024-05-03 MED ORDER — PANTOPRAZOLE SODIUM 40 MG PO TBEC
40.0000 mg | DELAYED_RELEASE_TABLET | Freq: Two times a day (BID) | ORAL | Status: DC
  Administered 2024-05-03 – 2024-05-04 (×2): 40 mg via ORAL
  Filled 2024-05-03 (×2): qty 1

## 2024-05-03 MED ORDER — AZITHROMYCIN 500 MG PO TABS
500.0000 mg | ORAL_TABLET | Freq: Every day | ORAL | Status: DC
  Administered 2024-05-04: 500 mg via ORAL
  Filled 2024-05-03 (×2): qty 1

## 2024-05-03 NOTE — Progress Notes (Addendum)
  Progress Note   Patient: Latoya Juarez FMW:[id number] DOB: 1971-10-12 DOA: 05/02/2024     1 DOS: the patient was seen and examined on 05/03/2024   Brief hospital course: 53 y.o. female with past medical history of Alcohol abuse, essential hypertension, iron deficiency anemia,obesity with a BMI of 33.63, prediabetes, presented with chest pain and shortness of breath.  CT angio of the chest ruled out PE.  Echocardiogram is pending.  Being treated for possible new onset acute CHF as well as bacterial pneumonia.  Assessment and Plan:  Possible new onset acute diastolic CHF, POA: Patient does have evidence of interstitial edema on the chest imaging along with elevated BNP.  Follow-up echocardiogram.  She did receive a dose of Lasix  last night.  Strict I/O monitoring.  Fluid striction of 1.5 L/day.  Possible bilateral community-acquired bacterial pneumonia, POA: Continue with treatment with intravenous ceftriaxone  and azithromycin . CTA chest ruled out PE.  Sepsis: Likely secondary to above.  Continue with antibiotics.  Follow-up blood cultures.  Still  Alcohol abuse, POA: Drinks 2-3 vodkas every day.  Continue with folate and supplementation.  Placed on CIWA protocol.  Prediabetes, POA: Close monitoring of blood glucose levels and carb controlled diet.  Class I obesity, POA: As evidenced by BMI of 33.6.  Counseled extensively regarding dietary modification and exercise to facilitate weight loss.  Thyroid  nodule, POA: Chronic finding.  Ultrasound of the thyroid  done on 03/31/2024 showed evidence of 4 cm right thyroid  nodule.  Recommendation is outpatient FNA.  Disposition: Home.  Patient is independent in activities of daily life.     Subjective: Shortness of breath is resolved.  She is not on supplemental oxygen.  She does drink 2-3 vodkas a day.  Echocardiogram is pending.  We discussed the results of the CTA of the chest.  Physical Exam: Vitals:   05/02/24 2226 05/03/24 0338 05/03/24 0837  05/03/24 0841  BP: 138/74 137/66  (!) 152/81  Pulse: 86 78 78 72  Resp: 18 20 18 18   Temp: 97.8 F (36.6 C) 98.5 F (36.9 C)  98.2 F (36.8 C)  SpO2: 98% 100%  99%  Weight:      Height:       Constitutional: NAD, calm, comfortable Eyes: PERRL, lids and conjunctivae normal ENMT: Mucous membranes are moist. Posterior pharynx clear of any exudate or lesions.Normal dentition.  Neck: normal, supple, no masses, no thyromegaly Respiratory: clear to auscultation bilaterally, no wheezing, no crackles. Normal respiratory effort. No accessory muscle use.  Cardiovascular: Regular rate and rhythm, no murmurs / rubs / gallops. No extremity edema. 2+ pedal pulses. No carotid bruits.  Abdomen: no tenderness, no masses palpated. No hepatosplenomegaly. Bowel sounds positive.  Musculoskeletal: no clubbing / cyanosis. No joint deformity upper and lower extremities. Good ROM, no contractures. Normal muscle tone.  Skin: no rashes, lesions, ulcers. No induration Neurologic: CN 2-12 grossly intact. Sensation intact, DTR normal. Strength 5/5 x all 4 extremities.  Psychiatric: Normal judgment and insight. Alert and oriented x 3. Normal mood.   Data Reviewed:  There are no new results to review at this time.  Family Communication: none at the bedside  Disposition: Status is: Inpatient Remains inpatient appropriate because: PNA,possible CHF  Planned Discharge Destination: Home    Time spent: 42 minutes  Author: Deliliah Room, MD 05/03/2024 9:48 AM  For on call review [email or web address].

## 2024-05-03 NOTE — Plan of Care (Signed)
  Problem: Education: Goal: Ability to describe self-care measures that may prevent or decrease complications (Diabetes Survival Skills Education) will improve Outcome: Progressing   Problem: Coping: Goal: Ability to adjust to condition or change in health will improve Outcome: Progressing   Problem: Fluid Volume: Goal: Ability to maintain a balanced intake and output will improve Outcome: Progressing   Problem: Metabolic: Goal: Ability to maintain appropriate glucose levels will improve Outcome: Progressing   Problem: Skin Integrity: Goal: Risk for impaired skin integrity will decrease Outcome: Progressing   Problem: Tissue Perfusion: Goal: Adequacy of tissue perfusion will improve Outcome: Progressing   Problem: Activity: Goal: Risk for activity intolerance will decrease Outcome: Progressing   Problem: Elimination: Goal: Will not experience complications related to bowel motility Outcome: Progressing Goal: Will not experience complications related to urinary retention Outcome: Progressing

## 2024-05-03 NOTE — Plan of Care (Signed)

## 2024-05-03 NOTE — TOC CM/SW Note (Addendum)
        Patient Goals and CMS Choice   CSW completed assessment via chart review. Pt is from home, provided resources for substance use and smoking cessation in pt's AVS. May need assistance with transportation at discharge.   No other needs identified at this time. TOC will sign off, please consult again if TOC needs arise.        Expected Discharge Plan and Services                                                Prior Living Arrangements/Services                       Activities of Daily Living   ADL Screening (condition at time of admission) Independently performs ADLs?: No Does the patient have a NEW difficulty with bathing/dressing/toileting/self-feeding that is expected to last >3 days?: No Does the patient have a NEW difficulty with getting in/out of bed, walking, or climbing stairs that is expected to last >3 days?: No Does the patient have a NEW difficulty with communication that is expected to last >3 days?: No Is the patient deaf or have difficulty hearing?: No Does the patient have difficulty seeing, even when wearing glasses/contacts?: No Does the patient have difficulty concentrating, remembering, or making decisions?: No  Permission Sought/Granted                  Emotional Assessment              Admission diagnosis:  Pneumonia [J18.9] Patient Active Problem List   Diagnosis Date Noted   Pneumonia 05/02/2024   Fibroid 10/27/2023   Severe episode of recurrent major depressive disorder, without psychotic features (HCC) 06/16/2023   Uterine fibroid 06/01/2023   Encounter for screening for other metabolic disorders 01/07/2023   Major depressive disorder, recurrent episode (HCC) 07/25/2022   Alcohol use disorder 07/25/2022   Use of cannabis 07/25/2022   Foreign body in ear 04/19/2018   Iron deficiency anemia 02/03/2017   Pica in adults 02/03/2017   Hyperlipidemia 02/03/2017   Alcohol use 01/31/2016   Itching of ear  01/31/2016   Quit smoking within past year 01/31/2016   Obesity 01/31/2016   HTN (hypertension) 12/12/2015   PCP:  Theotis Haze ORN, NP Pharmacy:   [facility] [address], [address] [place] [address] Phone: [phone] Fax: [phone]     Social Determinants of Health (SDOH) Interventions    Readmission Risk Interventions     No data to display

## 2024-05-03 NOTE — Progress Notes (Signed)
 Azith was not started yesterday in the ED, ok to change to 3 days and start today per Dr Dino.   Sergio Batch, PharmD, BCIDP, AAHIVP, CPP Infectious Disease Pharmacist 05/03/2024 8:47 AM

## 2024-05-04 ENCOUNTER — Other Ambulatory Visit: Payer: Self-pay

## 2024-05-04 DIAGNOSIS — J189 Pneumonia, unspecified organism: Secondary | ICD-10-CM | POA: Diagnosis not present

## 2024-05-04 LAB — BASIC METABOLIC PANEL WITH GFR
Anion gap: 12 (ref 5–15)
BUN: <5 mg/dL — ABNORMAL LOW (ref 6–20)
CO2: 29 mmol/L (ref 22–32)
Calcium: 8.8 mg/dL — ABNORMAL LOW (ref 8.9–10.3)
Chloride: 93 mmol/L — ABNORMAL LOW (ref 98–111)
Creatinine, Ser: 0.89 mg/dL (ref 0.44–1.00)
GFR, Estimated: >60 mL/min (ref >60)
Glucose, Bld: 142 mg/dL — ABNORMAL HIGH (ref 70–99)
Potassium: 2.8 mmol/L — ABNORMAL LOW (ref 3.5–5.1)
Sodium: 134 mmol/L — ABNORMAL LOW (ref 135–145)

## 2024-05-04 MED ORDER — AZITHROMYCIN 500 MG PO TABS
500.0000 mg | ORAL_TABLET | Freq: Every day | ORAL | 0 refills | Status: DC
  Filled 2024-05-04: qty 3, 3d supply, fill #0

## 2024-05-04 MED ORDER — POTASSIUM CHLORIDE CRYS ER 20 MEQ PO TBCR
20.0000 meq | EXTENDED_RELEASE_TABLET | Freq: Every day | ORAL | 0 refills | Status: DC
  Filled 2024-05-04: qty 10, 10d supply, fill #0

## 2024-05-04 MED ORDER — FOLIC ACID 1 MG PO TABS
1.0000 mg | ORAL_TABLET | Freq: Every day | ORAL | 0 refills | Status: DC
  Filled 2024-05-04: qty 30, 30d supply, fill #0

## 2024-05-04 MED ORDER — POTASSIUM CHLORIDE 20 MEQ PO PACK
60.0000 meq | PACK | Freq: Once | ORAL | Status: AC
  Administered 2024-05-04: 60 meq via ORAL
  Filled 2024-05-04: qty 3

## 2024-05-04 MED ORDER — FUROSEMIDE 20 MG PO TABS
20.0000 mg | ORAL_TABLET | Freq: Every day | ORAL | 0 refills | Status: DC
  Filled 2024-05-04: qty 30, 30d supply, fill #0

## 2024-05-04 MED ORDER — POTASSIUM CHLORIDE 20 MEQ PO PACK
40.0000 meq | PACK | Freq: Two times a day (BID) | ORAL | Status: DC
  Administered 2024-05-04: 40 meq via ORAL
  Filled 2024-05-04: qty 2

## 2024-05-04 MED ORDER — ADULT MULTIVITAMIN W/MINERALS CH
1.0000 | ORAL_TABLET | Freq: Every day | ORAL | 0 refills | Status: DC
  Filled 2024-05-04: qty 30, 30d supply, fill #0

## 2024-05-04 MED ORDER — VITAMIN B-1 100 MG PO TABS
100.0000 mg | ORAL_TABLET | Freq: Every day | ORAL | 0 refills | Status: DC
  Filled 2024-05-04: qty 30, 30d supply, fill #0

## 2024-05-04 MED ORDER — GUAIFENESIN ER 600 MG PO TB12
600.0000 mg | ORAL_TABLET | Freq: Two times a day (BID) | ORAL | 0 refills | Status: DC | PRN
  Filled 2024-05-04: qty 20, 10d supply, fill #0

## 2024-05-04 MED ORDER — LISINOPRIL 10 MG PO TABS
10.0000 mg | ORAL_TABLET | Freq: Every day | ORAL | 0 refills | Status: DC
  Filled 2024-05-04: qty 30, 30d supply, fill #0

## 2024-05-04 NOTE — Plan of Care (Signed)
  Problem: Education: Goal: Ability to describe self-care measures that may prevent or decrease complications (Diabetes Survival Skills Education) will improve Outcome: Adequate for Discharge Goal: Individualized Educational Video(s) Outcome: Adequate for Discharge   Problem: Coping: Goal: Ability to adjust to condition or change in health will improve Outcome: Adequate for Discharge   Problem: Fluid Volume: Goal: Ability to maintain a balanced intake and output will improve Outcome: Adequate for Discharge   Problem: Health Behavior/Discharge Planning: Goal: Ability to identify and utilize available resources and services will improve Outcome: Adequate for Discharge Goal: Ability to manage health-related needs will improve Outcome: Adequate for Discharge   Problem: Metabolic: Goal: Ability to maintain appropriate glucose levels will improve Outcome: Adequate for Discharge   Problem: Nutritional: Goal: Maintenance of adequate nutrition will improve Outcome: Adequate for Discharge Goal: Progress toward achieving an optimal weight will improve Outcome: Adequate for Discharge   Problem: Skin Integrity: Goal: Risk for impaired skin integrity will decrease Outcome: Adequate for Discharge   Problem: Tissue Perfusion: Goal: Adequacy of tissue perfusion will improve Outcome: Adequate for Discharge   Problem: Education: Goal: Knowledge of General Education information will improve Description: Including pain rating scale, medication(s)/side effects and non-pharmacologic comfort measures Outcome: Adequate for Discharge   Problem: Health Behavior/Discharge Planning: Goal: Ability to manage health-related needs will improve Outcome: Adequate for Discharge   Problem: Clinical Measurements: Goal: Ability to maintain clinical measurements within normal limits will improve Outcome: Adequate for Discharge Goal: Will remain free from infection Outcome: Adequate for Discharge Goal:  Diagnostic test results will improve Outcome: Adequate for Discharge Goal: Respiratory complications will improve Outcome: Adequate for Discharge Goal: Cardiovascular complication will be avoided Outcome: Adequate for Discharge   Problem: Activity: Goal: Risk for activity intolerance will decrease Outcome: Adequate for Discharge   Problem: Nutrition: Goal: Adequate nutrition will be maintained Outcome: Adequate for Discharge   Problem: Coping: Goal: Level of anxiety will decrease Outcome: Adequate for Discharge   Problem: Elimination: Goal: Will not experience complications related to bowel motility Outcome: Adequate for Discharge Goal: Will not experience complications related to urinary retention Outcome: Adequate for Discharge   Problem: Pain Managment: Goal: General experience of comfort will improve and/or be controlled Outcome: Adequate for Discharge   Problem: Safety: Goal: Ability to remain free from injury will improve Outcome: Adequate for Discharge   Problem: Skin Integrity: Goal: Risk for impaired skin integrity will decrease Outcome: Adequate for Discharge   Problem: Activity: Goal: Ability to tolerate increased activity will improve Outcome: Adequate for Discharge   Problem: Clinical Measurements: Goal: Ability to maintain a body temperature in the normal range will improve Outcome: Adequate for Discharge   Problem: Respiratory: Goal: Ability to maintain adequate ventilation will improve Outcome: Adequate for Discharge Goal: Ability to maintain a clear airway will improve Outcome: Adequate for Discharge

## 2024-05-04 NOTE — Plan of Care (Signed)
   Problem: Education: Goal: Ability to describe self-care measures that may prevent or decrease complications (Diabetes Survival Skills Education) will improve Outcome: Progressing   Problem: Coping: Goal: Ability to adjust to condition or change in health will improve Outcome: Progressing   Problem: Fluid Volume: Goal: Ability to maintain a balanced intake and output will improve Outcome: Progressing   Problem: Skin Integrity: Goal: Risk for impaired skin integrity will decrease Outcome: Progressing   Problem: Tissue Perfusion: Goal: Adequacy of tissue perfusion will improve Outcome: Progressing

## 2024-05-04 NOTE — Discharge Summary (Signed)
 Physician Discharge Summary   Patient: Latoya Juarez MRN: [id number] DOB: 04/01/1971  Admit date:     05/02/2024  Discharge date: 05/04/24  Discharge Physician: Deliliah Room   PCP: Theotis Haze ORN, NP   Recommendations at discharge:    Follow up with PCP in one week Continue taking meds as prescribed BMP check in one week Stop smoking and drinking alcohol  Discharge Diagnoses: Acute diastolic CHF Possible bacterial CAP  Hospital Course:   New onset acute diastolic CHF, POA: Patient does have evidence of interstitial edema on the chest imaging along with elevated BNP.  Follow-up echocardiogram-EF 65-70% with Grade 1 diastolic dysfunction .  Strict I/O monitoring.  Fluid striction of 1.5 L/day. Started on oral lasix  20 mg daily along with KCL supplementation Out pt follow up with PCP in one week and BMP check.   Hypokalemia: Secondary to diuretics, repleted   Possible bilateral community-acquired bacterial pneumonia, POA: Received intravenous ceftriaxone  and azithromycin . CTA chest ruled out PE. Dced on oral azithromycin .   Sepsis: Resolved. Likely secondary to above.  Continue with antibiotics.  Follow-up blood cultures-NGTD.    Alcohol abuse, POA: Drinks 2-3 vodkas every day.  Continue with folate and supplementation.  Placed on CIWA protocol. Counseled extensively regarding alcohol cessation.   Prediabetes, POA: Close monitoring of blood glucose levels and carb controlled diet.   Class I obesity, POA: As evidenced by BMI of 33.6.  Counseled extensively regarding dietary modification and exercise to facilitate weight loss.   Thyroid  nodule, POA: Chronic finding.  Ultrasound of the thyroid  done on 03/31/2024 showed evidence of 4 cm right thyroid  nodule.  Recommendation is outpatient FNA.   Disposition: Home.  Patient is independent in activities of daily life.         Consultants: None Procedures performed: None  Disposition: Home Diet recommendation:  Cardiac  and Carb modified diet DISCHARGE MEDICATION: Allergies as of 05/04/2024       Reactions   Vistaril  [hydroxyzine ] Other (See Comments)   Nightmares        Medication List     STOP taking these medications    lisinopril -hydrochlorothiazide  10-12.5 MG tablet Commonly known as: ZESTORETIC        TAKE these medications    Acetaminophen  Extra Strength 500 MG Tabs Commonly known as: TYLENOL  Take 2 tablets (1,000 mg total) by mouth every 6 (six) hours as needed.   azithromycin  500 MG tablet Commonly known as: ZITHROMAX  Take 1 tablet (500 mg total) by mouth daily. Start taking on: May 05, 2024   folic acid  1 MG tablet Commonly known as: FOLVITE  Take 1 tablet (1 mg total) by mouth daily. Start taking on: May 05, 2024   furosemide  20 MG tablet Commonly known as: LASIX  Take 1 tablet (20 mg total) by mouth daily. Start taking on: May 05, 2024   gabapentin  100 MG capsule Commonly known as: NEURONTIN  Take 1 capsule (100 mg total) by mouth 3 (three) times daily.   guaiFENesin  600 MG 12 hr tablet Commonly known as: MUCINEX  Take 1 tablet (600 mg total) by mouth 2 (two) times daily as needed for cough.   ibuprofen  800 MG tablet Commonly known as: ADVIL  Take 1 tablet (800 mg total) by mouth 3 (three) times daily with meals as needed for headache, moderate pain (pain score 4-6) or cramping.   lisinopril  10 MG tablet Commonly known as: ZESTRIL  Take 1 tablet (10 mg total) by mouth daily. Start taking on: May 05, 2024   metFORMIN  500 MG tablet  Commonly known as: GLUCOPHAGE  Take 1 tablet (500 mg total) by mouth daily with breakfast.   multivitamin with minerals Tabs tablet Take 1 tablet by mouth daily. Start taking on: May 05, 2024   potassium chloride  SA 20 MEQ tablet Commonly known as: KLOR-CON  M Take 1 tablet (20 mEq total) by mouth daily.   pravastatin  40 MG tablet Commonly known as: PRAVACHOL  Take 1 tablet (40 mg total) by mouth daily. FOR CHOLESTEROL    thiamine  100 MG tablet Commonly known as: Vitamin B-1 Take 1 tablet (100 mg total) by mouth daily. Start taking on: May 05, 2024        Follow-up Information     Theotis Haze ORN, NP. Call in 1 week(s).   Specialty: Nurse Practitioner Contact information: [address] [place] [address] [phone]                Discharge Exam: Filed Weights   05/02/24 1028  Weight: 80.7 kg   Constitutional: NAD, calm, comfortable Eyes: PERRL, lids and conjunctivae normal ENMT: Mucous membranes are moist. Posterior pharynx clear of any exudate or lesions.Normal dentition.  Neck: normal, supple, no masses, no thyromegaly Respiratory: clear to auscultation bilaterally, no wheezing, no crackles. Normal respiratory effort. No accessory muscle use.  Cardiovascular: Regular rate and rhythm, no murmurs / rubs / gallops. No extremity edema. 2+ pedal pulses. No carotid bruits.  Abdomen: no tenderness, no masses palpated. No hepatosplenomegaly. Bowel sounds positive.  Musculoskeletal: no clubbing / cyanosis. No joint deformity upper and lower extremities. Good ROM, no contractures. Normal muscle tone.  Skin: no rashes, lesions, ulcers. No induration Neurologic: CN 2-12 grossly intact. Sensation intact, DTR normal. Strength 5/5 x all 4 extremities.  Psychiatric: Normal judgment and insight. Alert and oriented x 3. Normal mood.    Condition at discharge: good  The results of significant diagnostics from this hospitalization (including imaging, microbiology, ancillary and laboratory) are listed below for reference.   Imaging Studies: ECHOCARDIOGRAM COMPLETE Result Date: 05/03/2024    ECHOCARDIOGRAM REPORT   Patient Name:   Latoya Juarez Date of Exam: 05/03/2024 Medical Rec #:  [id number]       Height:       61.0 in Accession #:    [id number]      Weight:       178.0 lb Date of Birth:  14-Mar-1971       BSA:          1.797 m Patient Age:    53 years        BP:           137/66 mmHg  Patient Gender: F               HR:           78 bpm. Exam Location:  Inpatient Procedure: 2D Echo, Cardiac Doppler and Color Doppler (Both Spectral and Color            Flow Doppler were utilized during procedure). Indications:    CHF  History:        Patient has no prior history of Echocardiogram examinations.                 Risk Factors:Hypertension.  Sonographer:    Jayson Gaskins Referring Phys: [id number] JUSTIN B HOWERTER IMPRESSIONS  1. Left ventricular ejection fraction, by estimation, is 65 to 70%. The left ventricle has normal function. The left ventricle has no regional wall motion abnormalities. Left ventricular diastolic parameters are  consistent with Grade I diastolic dysfunction (impaired relaxation).  2. Right ventricular systolic function is normal. The right ventricular size is normal. There is mildly elevated pulmonary artery systolic pressure. The estimated right ventricular systolic pressure is 41.4 mmHg.  3. The mitral valve is normal in structure. No evidence of mitral valve regurgitation. No evidence of mitral stenosis.  4. The aortic valve is tricuspid. Aortic valve regurgitation is not visualized. No aortic stenosis is present.  5. The inferior vena cava is normal in size with greater than 50% respiratory variability, suggesting right atrial pressure of 3 mmHg. FINDINGS  Left Ventricle: Left ventricular ejection fraction, by estimation, is 65 to 70%. The left ventricle has normal function. The left ventricle has no regional wall motion abnormalities. The left ventricular internal cavity size was normal in size. There is  no left ventricular hypertrophy. Left ventricular diastolic parameters are consistent with Grade I diastolic dysfunction (impaired relaxation). Indeterminate filling pressures. Right Ventricle: The right ventricular size is normal. No increase in right ventricular wall thickness. Right ventricular systolic function is normal. There is mildly elevated pulmonary artery systolic  pressure. The tricuspid regurgitant velocity is 3.10  m/s, and with an assumed right atrial pressure of 3 mmHg, the estimated right ventricular systolic pressure is 41.4 mmHg. Left Atrium: Left atrial size was normal in size. Right Atrium: Right atrial size was normal in size. Pericardium: There is no evidence of pericardial effusion. Mitral Valve: The mitral valve is normal in structure. No evidence of mitral valve regurgitation. No evidence of mitral valve stenosis. Tricuspid Valve: The tricuspid valve is normal in structure. Tricuspid valve regurgitation is trivial. Aortic Valve: The aortic valve is tricuspid. Aortic valve regurgitation is not visualized. No aortic stenosis is present. Aortic valve mean gradient measures 5.0 mmHg. Aortic valve peak gradient measures 10.4 mmHg. Aortic valve area, by VTI measures 2.37  cm. Pulmonic Valve: The pulmonic valve was not well visualized. Pulmonic valve regurgitation is trivial. No evidence of pulmonic stenosis. Aorta: The aortic root is normal in size and structure. Venous: The inferior vena cava is normal in size with greater than 50% respiratory variability, suggesting right atrial pressure of 3 mmHg. IAS/Shunts: No atrial level shunt detected by color flow Doppler.  LEFT VENTRICLE PLAX 2D LVIDd:         4.40 cm   Diastology LVIDs:         2.60 cm   LV e' medial:    9.14 cm/s LV PW:         1.00 cm   LV E/e' medial:  12.7 LV IVS:        1.00 cm   LV e' lateral:   10.80 cm/s LVOT diam:     1.80 cm   LV E/e' lateral: 10.7 LV SV:         66 LV SV Index:   37 LVOT Area:     2.54 cm  RIGHT VENTRICLE RV S prime:     16.50 cm/s TAPSE (M-mode): 2.8 cm LEFT ATRIUM             Index        RIGHT ATRIUM           Index LA Vol (A2C):   39.9 ml 22.20 ml/m  RA Area:     16.10 cm LA Vol (A4C):   31.7 ml 17.64 ml/m  RA Volume:   41.80 ml  23.25 ml/m LA Biplane Vol: 35.7 ml 19.86 ml/m  AORTIC VALVE AV Area (  Vmax):    2.18 cm AV Area (Vmean):   2.47 cm AV Area (VTI):     2.37  cm AV Vmax:           161.00 cm/s AV Vmean:          104.000 cm/s AV VTI:            0.278 m AV Peak Grad:      10.4 mmHg AV Mean Grad:      5.0 mmHg LVOT Vmax:         138.00 cm/s LVOT Vmean:        101.000 cm/s LVOT VTI:          0.259 m LVOT/AV VTI ratio: 0.93  AORTA Ao Root diam: 3.00 cm MITRAL VALVE                TRICUSPID VALVE MV Area (PHT): 3.23 cm     TR Peak grad:   38.4 mmHg MV Decel Time: 235 msec     TR Vmax:        310.00 cm/s MV E velocity: 116.00 cm/s MV A velocity: 135.00 cm/s  SHUNTS MV E/A ratio:  0.86         Systemic VTI:  0.26 m                             Systemic Diam: 1.80 cm Jerel Croitoru MD Electronically signed by Jerel Balding MD Signature Date/Time: 05/03/2024/11:54:59 AM    Final    CT Angio Chest Pulmonary Embolism (PE) W or WO Contrast Result Date: 05/02/2024 CLINICAL DATA:  High probability for PE EXAM: CT ANGIOGRAPHY CHEST WITH CONTRAST TECHNIQUE: Multidetector CT imaging of the chest was performed using the standard protocol during bolus administration of intravenous contrast. Multiplanar CT image reconstructions and MIPs were obtained to evaluate the vascular anatomy. RADIATION DOSE REDUCTION: This exam was performed according to the departmental dose-optimization program which includes automated exposure control, adjustment of the mA and/or kV according to patient size and/or use of iterative reconstruction technique. CONTRAST:  75mL OMNIPAQUE  IOHEXOL  350 MG/ML SOLN COMPARISON:  None Available. FINDINGS: Cardiovascular: Satisfactory opacification of the pulmonary arteries to the segmental level. No evidence of pulmonary embolism. Normal heart size. No pericardial effusion. Mediastinum/Nodes: There is prevascular lymphadenopathy measuring up to 13 mm. There is diffuse paratracheal lymphadenopathy measuring up to 2.2 cm. Subcarinal lymph node measures 10 mm. Visualized esophagus is within normal limits. There is some edema in the anterior mediastinum. Right thyroid  nodule  measures 3.3 x 3.0 cm. Alternatively this may represent an enlarged lymph node. Lungs/Pleura: There is smooth interlobular septal thickening and patchy ground-glass opacities throughout both lungs in the central predominance. There small bilateral pleural effusions. There is central peribronchial wall thickening. Upper Abdomen: No acute abnormality. Musculoskeletal: No chest wall abnormality. No acute or significant osseous findings. Review of the MIP images confirms the above findings. IMPRESSION: 1. No evidence for pulmonary embolism. 2. Smooth interlobular septal thickening and patchy ground-glass opacities throughout both lungs in the central predominance compatible with pulmonary edema. 3. Small bilateral pleural effusions. 4. Mediastinal lymphadenopathy, likely reactive. 5. Right thyroid  nodule measures 3.3 x 3.0 cm. Alternatively this may represent an enlarged lymph node. Follow-up dedicated thyroid  ultrasound recommended. Electronically Signed   By: Greig Pique M.D.   On: 05/02/2024 19:17   DG Chest 2 View Result Date: 05/02/2024 CLINICAL DATA:  Shortness of breath.  Chest pain. EXAM: CHEST -  2 VIEW COMPARISON:  02/16/2014. FINDINGS: Low lung volume. There are diffuse heterogeneous alveolar and interstitial opacities throughout bilateral lungs with asymmetric more involvement of right lung lower lung zone and asymmetric less involvement of left lower lung zone, favored to represent multilobar pneumonia. Follow-up to clearing is recommended. Bilateral costophrenic angles are clear. Normal cardio-mediastinal silhouette. No acute osseous abnormalities. The soft tissues are within normal limits. IMPRESSION: Findings favor multilobar pneumonia. Follow-up to clearing is recommended. Electronically Signed   By: Ree Molt M.D.   On: 05/02/2024 11:07    Microbiology: Results for orders placed or performed during the hospital encounter of 05/02/24  Culture, blood (routine x 2)     Status: None  (Preliminary result)   Collection Time: 05/02/24 12:44 PM   Specimen: BLOOD LEFT ARM  Result Value Ref Range Status   Specimen Description BLOOD LEFT ARM  Final   Special Requests   Final    BOTTLES DRAWN AEROBIC AND ANAEROBIC Blood Culture results may not be optimal due to an inadequate volume of blood received in culture bottles   Culture   Final    NO GROWTH 2 DAYS Performed at [facility], [address], [place], [place] [address]    Report Status PENDING  Incomplete  Culture, blood (routine x 2)     Status: None (Preliminary result)   Collection Time: 05/02/24 12:50 PM   Specimen: BLOOD RIGHT ARM  Result Value Ref Range Status   Specimen Description BLOOD RIGHT ARM  Final   Special Requests   Final    BOTTLES DRAWN AEROBIC AND ANAEROBIC Blood Culture results may not be optimal due to an inadequate volume of blood received in culture bottles   Culture   Final    NO GROWTH 2 DAYS Performed at [facility], [address] [place], [place] [address]    Report Status PENDING  Incomplete    Labs: CBC: Recent Labs  Lab 05/02/24 1034  WBC 11.3*  HGB 10.7*  HCT 33.4*  MCV 84.8  PLT 366   Basic Metabolic Panel: Recent Labs  Lab 05/02/24 1034 05/04/24 0420  NA 139 134*  K 3.5 2.8*  CL 103 93*  CO2 21* 29  GLUCOSE 86 142*  BUN <5* <5*  CREATININE 0.71 0.89  CALCIUM 8.7* 8.8*   Liver Function Tests: Recent Labs  Lab 05/02/24 1543  AST 34  ALT 25  ALKPHOS 86  BILITOT 1.5*  PROT 6.8  ALBUMIN 3.1*   CBG: Recent Labs  Lab 05/02/24 2121 05/03/24 0354 05/03/24 2210  GLUCAP 96 115* 123*    Discharge time spent: 42 minutes  Signed: Deliliah Room, MD [facility] 05/04/2024

## 2024-05-05 ENCOUNTER — Telehealth: Payer: Self-pay | Admitting: Nurse Practitioner

## 2024-05-05 ENCOUNTER — Other Ambulatory Visit: Payer: Self-pay

## 2024-05-05 ENCOUNTER — Other Ambulatory Visit: Payer: Self-pay | Admitting: Nurse Practitioner

## 2024-05-05 ENCOUNTER — Telehealth: Payer: Self-pay

## 2024-05-05 DIAGNOSIS — E782 Mixed hyperlipidemia: Secondary | ICD-10-CM

## 2024-05-05 DIAGNOSIS — R7303 Prediabetes: Secondary | ICD-10-CM

## 2024-05-05 MED ORDER — PRAVASTATIN SODIUM 40 MG PO TABS
40.0000 mg | ORAL_TABLET | Freq: Every day | ORAL | 1 refills | Status: AC
  Filled 2024-05-05: qty 90, 90d supply, fill #0
  Filled 2024-05-31: qty 30, 30d supply, fill #0
  Filled 2024-07-25: qty 30, 30d supply, fill #1
  Filled 2024-10-13 – 2024-10-14 (×2): qty 30, 30d supply, fill #2

## 2024-05-05 MED ORDER — METFORMIN HCL 500 MG PO TABS
500.0000 mg | ORAL_TABLET | Freq: Every day | ORAL | 1 refills | Status: DC
  Filled 2024-05-05 – 2024-05-31 (×2): qty 90, 90d supply, fill #0
  Filled 2024-10-13 – 2024-10-14 (×2): qty 90, 90d supply, fill #1

## 2024-05-05 NOTE — Telephone Encounter (Signed)
 Copied from CRM [id number]. Topic: Appointments - Appointment Scheduling >> May 05, 2024 11:14 AM Myrick T wrote:  Patient/patient representative is calling to schedule an appointment. Refer to attachments for appointment information. Patient called stated she needs a HFU, discharged on 7/16. No availability unitl August. Please f/u with patient for appt

## 2024-05-05 NOTE — Telephone Encounter (Signed)
 The patient has been scheduled for the next available appointment and has confirmed and acknowledged of the appointment.

## 2024-05-05 NOTE — Transitions of Care (Post Inpatient/ED Visit) (Signed)
 05/05/2024  Name: Latoya Juarez MRN: [id number] DOB: 1971/10/12  Today's TOC FU Call Status: Today's TOC FU Call Status:: Successful TOC FU Call Completed TOC FU Call Complete Date: 05/05/24 Patient's Name and Date of Birth confirmed.  Transition Care Management Follow-up Telephone Call Date of Discharge: 05/04/24 Discharge Facility: [facility]) Type of Discharge: Inpatient Admission Primary Inpatient Discharge Diagnosis:: pneumonia How have you been since you were released from the hospital?: Better (She said overall she is feeling better than she did in the hospital but she did not sleep well last night because she was quite anxious.  She has not picked up her medications yet and plans to do so today.) Any questions or concerns?: No  Items Reviewed: Did you receive and understand the discharge instructions provided?: Yes Medications obtained,verified, and reconciled?: Yes (Medications Reviewed) (list reveiwed with patient. She has not picked up the new medications yet. she has the other meds that she was taking prior to her hospitalization.  She was not aware that she was supposed to stop taking zestoretic  but she said she understands now.) Any new allergies since your discharge?: No Dietary orders reviewed?: Yes Type of Diet Ordered:: heart healthy, low sodium Do you have support at home?: Yes People in Home [RPT]: child(ren), adult Name of Support/Comfort Primary Source: She said her daughter lives with her  Medications Reviewed Today: Medications Reviewed Today     Reviewed by Marvis Bradley, RN (Case Manager) on 05/05/24 at 1204  Med List Status: <None>   Medication Order Taking? Sig Documenting Provider Last Dose Status Informant  acetaminophen  (TYLENOL ) 500 MG tablet [id number] No Take 2 tablets (1,000 mg total) by mouth every 6 (six) hours as needed. Ajewole, Christana, MD Past Month Active Self, Pharmacy Records  azithromycin  (ZITHROMAX ) 500 MG tablet [id number]  Take  1 tablet (500 mg total) by mouth daily. Rashid, Farhan, MD  Active   folic acid  (FOLVITE ) 1 MG tablet [id number]  Take 1 tablet (1 mg total) by mouth daily. Rashid, Farhan, MD  Active   furosemide  (LASIX ) 20 MG tablet [id number]  Take 1 tablet (20 mg total) by mouth daily. Dino Antu, MD  Active   gabapentin  (NEURONTIN ) 100 MG capsule [id number] No Take 1 capsule (100 mg total) by mouth 3 (three) times daily. Theotis Haze ORN, NP 05/02/2024 Morning Active Self, Pharmacy Records  guaiFENesin  (MUCINEX ) 600 MG 12 hr tablet [id number]  Take 1 tablet (600 mg total) by mouth 2 (two) times daily as needed for cough. Rashid, Farhan, MD  Active   ibuprofen  (ADVIL ) 800 MG tablet [id number] No Take 1 tablet (800 mg total) by mouth 3 (three) times daily with meals as needed for headache, moderate pain (pain score 4-6) or cramping. Ajewole, Christana, MD Past Month Active Self, Pharmacy Records  lisinopril  (ZESTRIL ) 10 MG tablet [id number]  Take 1 tablet (10 mg total) by mouth daily. Dino Antu, MD  Active   metFORMIN  (GLUCOPHAGE ) 500 MG tablet [id number] No Take 1 tablet (500 mg total) by mouth daily with breakfast. Theotis Haze ORN, NP 05/02/2024 Morning Active Self, Pharmacy Records           Med Note (COFFELL, JON HERO   Tue May 03, 2024 12:37 PM) Per dispense report, LF 03/11/2024 #30, 30 DS. Pt insists compliance.  Multiple Vitamin (MULTIVITAMIN WITH MINERALS) TABS tablet [id number]  Take 1 tablet by mouth daily. Rashid, Farhan, MD  Active   potassium chloride  SA (KLOR-CON  M) 20 MEQ tablet [id number]  Take 1 tablet (  20 mEq total) by mouth daily. Dino Antu, MD  Active   pravastatin  (PRAVACHOL ) 40 MG tablet [id number] No Take 1 tablet (40 mg total) by mouth daily. FOR CHOLESTEROL Fleming, Zelda W, NP 05/02/2024 Morning Active Self, Pharmacy Records           Med Note (COFFELL, JON HERO   Tue May 03, 2024 12:37 PM) Per dispense report, LF 03/11/2024 #30, 30 DS. Pt insists compliance.  thiamine  (VITAMIN B-1) 100  MG tablet [id number]  Take 1 tablet (100 mg total) by mouth daily. Dino Antu, MD  Active             Home Care and Equipment/Supplies: Were Home Health Services Ordered?: No Any new equipment or medical supplies ordered?: No  Functional Questionnaire: Do you need assistance with bathing/showering or dressing?: No Do you need assistance with meal preparation?: No Do you need assistance with eating?: No Do you have difficulty maintaining continence: No Do you need assistance with getting out of bed/getting out of a chair/moving?: No Do you have difficulty managing or taking your medications?: No  Follow up appointments reviewed: PCP Follow-up appointment confirmed?: Yes Date of PCP follow-up appointment?: 05/20/24 Follow-up Provider: Haze Servant, NP.  I offered to schedule her at another clinic or explained that she can be seen at our [facility] in order to see a provider before 05/20/2024 but she said she wants to wait to see Ms Servant, NP Specialist Hospital Follow-up appointment confirmed?: Yes Date of Specialist follow-up appointment?: 07/08/24 Follow-Up Specialty Provider:: neurology Do you need transportation to your follow-up appointment?: No Do you understand care options if your condition(s) worsen?: Yes-patient verbalized understanding    SIGNATURE. Slater Diesel, RN

## 2024-05-07 LAB — CULTURE, BLOOD (ROUTINE X 2)
Culture: NO GROWTH
Culture: NO GROWTH

## 2024-05-16 ENCOUNTER — Other Ambulatory Visit: Payer: Self-pay

## 2024-05-20 ENCOUNTER — Inpatient Hospital Stay: Admitting: Nurse Practitioner

## 2024-05-20 ENCOUNTER — Telehealth: Payer: Self-pay | Admitting: Nurse Practitioner

## 2024-05-20 NOTE — Telephone Encounter (Signed)
 Called patient to confirm upcoming appointment 05/23/2024. Patient appointment has been successfully confirmed

## 2024-05-23 ENCOUNTER — Other Ambulatory Visit: Payer: Self-pay

## 2024-05-23 ENCOUNTER — Inpatient Hospital Stay: Admitting: Nurse Practitioner

## 2024-05-23 NOTE — Telephone Encounter (Signed)
 Copied from CRM [id number]. Topic: Appointments - Scheduling Inquiry for Clinic >> May 23, 2024  8:18 AM Antwanette L wrote:  Reason for CRM: Pt had a hospital f/u today but had to r/s. New appt is 9/3 at 9:50 AM with Haze Servant. Pt has been added to the wait list. Pt needs to be seen sooner than 9/3. Please contact the pt at [phone].

## 2024-05-23 NOTE — Telephone Encounter (Signed)
Called patient, unable to reach patient

## 2024-05-31 ENCOUNTER — Other Ambulatory Visit: Payer: Self-pay

## 2024-06-02 ENCOUNTER — Other Ambulatory Visit: Payer: Self-pay | Admitting: Nurse Practitioner

## 2024-06-02 ENCOUNTER — Other Ambulatory Visit: Payer: Self-pay

## 2024-06-06 ENCOUNTER — Other Ambulatory Visit: Payer: Self-pay

## 2024-06-15 ENCOUNTER — Other Ambulatory Visit: Payer: Self-pay | Admitting: Nurse Practitioner

## 2024-06-15 ENCOUNTER — Other Ambulatory Visit: Payer: Self-pay

## 2024-06-15 NOTE — Telephone Encounter (Unsigned)
 Copied from CRM #[id number]. Topic: Clinical - Medication Refill >> Jun 15, 2024  9:32 AM Tiffany S wrote: Medication: lisinopril  (ZESTRIL ) 10 MG tablet [[id number]]  Has the patient contacted their pharmacy? Yes (Agent: If no, request that the patient contact the pharmacy for the refill. If patient does not wish to contact the pharmacy document the reason why and proceed with request.) (Agent: If yes, when and what did the pharmacy advise?)  This is the patient's preferred pharmacy:  [facility] [address], [address] [place] [address] Phone: [phone] Fax: [phone]  Is this the correct pharmacy for this prescription? Yes If no, delete pharmacy and type the correct one.   Has the prescription been filled recently? Yes  Is the patient out of the medication? Yes  Has the patient been seen for an appointment in the last year OR does the patient have an upcoming appointment? Yes  Can we respond through MyChart? Yes  Agent: Please be advised that Rx refills may take up to 3 business days. We ask that you follow-up with your pharmacy.

## 2024-06-16 ENCOUNTER — Other Ambulatory Visit: Payer: Self-pay

## 2024-06-16 MED ORDER — POTASSIUM CHLORIDE CRYS ER 20 MEQ PO TBCR
20.0000 meq | EXTENDED_RELEASE_TABLET | Freq: Every day | ORAL | 0 refills | Status: DC
  Filled 2024-06-16: qty 10, 10d supply, fill #0

## 2024-06-16 MED ORDER — LISINOPRIL 10 MG PO TABS
10.0000 mg | ORAL_TABLET | Freq: Every day | ORAL | 0 refills | Status: DC
  Filled 2024-06-16: qty 30, 30d supply, fill #0

## 2024-06-16 MED ORDER — VITAMIN B-1 100 MG PO TABS
100.0000 mg | ORAL_TABLET | Freq: Every day | ORAL | 0 refills | Status: DC
  Filled 2024-06-16: qty 30, 30d supply, fill #0

## 2024-06-16 MED ORDER — FOLIC ACID 1 MG PO TABS
1.0000 mg | ORAL_TABLET | Freq: Every day | ORAL | 0 refills | Status: DC
  Filled 2024-06-16: qty 30, 30d supply, fill #0

## 2024-06-16 MED ORDER — ADULT MULTIVITAMIN W/MINERALS CH
1.0000 | ORAL_TABLET | Freq: Every day | ORAL | 0 refills | Status: DC
  Filled 2024-06-16: qty 30, 30d supply, fill #0

## 2024-06-16 MED ORDER — FUROSEMIDE 20 MG PO TABS
20.0000 mg | ORAL_TABLET | Freq: Every day | ORAL | 0 refills | Status: DC
  Filled 2024-06-16: qty 30, 30d supply, fill #0

## 2024-06-16 NOTE — Telephone Encounter (Signed)
 Requested medications are due for refill today.  yes  Requested medications are on the active medications list.  yes  Last refill. 05/05/2024 #30 0 rf  Future visit scheduled.   yes  Notes to clinic.  Rx is signed by Dr. Deliliah Room.    Requested Prescriptions  Pending Prescriptions Disp Refills   lisinopril  (ZESTRIL ) 10 MG tablet 30 tablet 0    Sig: Take 1 tablet (10 mg total) by mouth daily.     Cardiovascular:  ACE Inhibitors Failed - 06/16/2024  3:50 PM      Failed - K in normal range and within 180 days    Potassium  Date Value Ref Range Status  05/04/2024 2.8 (L) 3.5 - 5.1 mmol/L Final         Failed - Last BP in normal range    BP Readings from Last 1 Encounters:  05/04/24 (!) 134/98         Passed - Cr in normal range and within 180 days    Creat  Date Value Ref Range Status  06/17/2016 0.61 0.50 - 1.10 mg/dL Final   Creatinine, Ser  Date Value Ref Range Status  05/04/2024 0.89 0.44 - 1.00 mg/dL Final         Passed - Patient is not pregnant      Passed - Valid encounter within last 6 months    Recent Outpatient Visits           2 months ago Neuropathy   [facility] [place]. [facility] Theotis Haze ORN, NP   1 year ago Primary hypertension   [place] [facility] [facility] Dept Of [place]. [facility] Theotis Haze ORN, NP   1 year ago Generalized abdominal or pelvic swelling or mass or lump   [place] [facility] Dept Of [place]. [facility] Theotis Haze ORN, NP   2 years ago Anxiety and depression   [facility] [facility] [place] [facility] Dept Of [place]. [facility] [place], [place], [place]   2 years ago RLQ abdominal mass   [place] [facility] Dept Of [place]. [facility] Theotis Haze ORN, NP       Future Appointments             In 2 weeks Theotis Haze ORN, NP [facility] - A Dept Of [place]olynn DEL. [facility], [place]

## 2024-06-16 NOTE — Telephone Encounter (Signed)
 Requested medication (s) are due for refill today: yes  Requested medication (s) are on the active medication list: yes  Last refill:  all requested meds were last refilled 05/04/24   Future visit scheduled: yes  Notes to clinic:  all meds prescribed at hospital discharge/thiamine  not assigned to a protocol/ furosemide  abnormal lab work    Requested Prescriptions  Pending Prescriptions Disp Refills   thiamine  (VITAMIN B-1) 100 MG tablet 30 tablet 0    Sig: Take 1 tablet (100 mg total) by mouth daily.     Off-Protocol Failed - 06/16/2024  3:50 PM      Failed - Medication not assigned to a protocol, review manually.      Passed - Valid encounter within last 12 months    Recent Outpatient Visits           2 months ago Neuropathy   [facility] [facility] Of [place]. [facility] Theotis Haze ORN, NP   1 year ago Primary hypertension   [place] [facility] Dept Of [place]. [facility] Theotis Haze ORN, NP   1 year ago Generalized abdominal or pelvic swelling or mass or lump   [place] [facility] Dept Of [place]. [facility] Theotis Haze ORN, NP   2 years ago Anxiety and depression   [place] [facility] [place] - [facility] Dept Of [place]. [facility] [place], [place], [place]   2 years ago RLQ abdominal mass   [facility] [facility] - [facility] Dept Of [place]. [facility] Theotis Haze ORN, NP       Future Appointments             In 2 weeks Theotis Haze ORN, NP [facility] - A Dept Of [place]olynn DEL. [facility], [address]             Multiple Vitamin (MULTIVITAMIN WITH MINERALS) TABS tablet 30 tablet 0    Sig: Take 1 tablet by mouth daily.     There is no refill protocol information for this order     folic acid  (FOLVITE ) 1 MG tablet 30 tablet 0    Sig: Take 1 tablet (1 mg total) by mouth daily.     Endocrinology:  Vitamins Passed - 06/16/2024   3:50 PM      Passed - Valid encounter within last 12 months    Recent Outpatient Visits           2 months ago Neuropathy   [place] [facility] Of [place]. [facility] Theotis Haze ORN, NP   1 year ago Primary hypertension   [place] [facility] [place] - [facility] Dept Of [place]. [facility] Theotis Haze ORN, NP   1 year ago Generalized abdominal or pelvic swelling or mass or lump   [place] [facility] Dept Of [place]. [facility] Theotis Haze ORN, NP   2 years ago Anxiety and depression   [place] [facility] [place] - A Dept Of [place]. [facility] [place], [place], [place]   2 years ago RLQ abdominal mass   [place] [facility] [place] - A Dept Of . [facility] Theotis Haze ORN, NP       Future Appointments  In 2 weeks Theotis Haze ORN, NP [place] [facility] - A Dept Of [place]olynn DEL. [facility], [address]             furosemide  (LASIX ) 20 MG tablet 30 tablet 0    Sig: Take 1 tablet (20 mg total) by mouth daily.     Cardiovascular:  Diuretics - Loop Failed - 06/16/2024  3:50 PM      Failed - K in normal range and within 180 days    Potassium  Date Value Ref Range Status  05/04/2024 2.8 (L) 3.5 - 5.1 mmol/L Final         Failed - Ca in normal range and within 180 days    Calcium  Date Value Ref Range Status  05/04/2024 8.8 (L) 8.9 - 10.3 mg/dL Final         Failed - Na in normal range and within 180 days    Sodium  Date Value Ref Range Status  05/04/2024 134 (L) 135 - 145 mmol/L Final  11/05/2023 137 134 - 144 mmol/L Final         Failed - Cl in normal range and within 180 days    Chloride  Date Value Ref Range Status  05/04/2024 93 (L) 98 - 111 mmol/L Final         Failed - Mg Level in normal range and within 180 days    No results found for: MG       Failed - Last BP in normal range    BP Readings from Last 1  Encounters:  05/04/24 (!) 134/98         Passed - Cr in normal range and within 180 days    Creat  Date Value Ref Range Status  06/17/2016 0.61 0.50 - 1.10 mg/dL Final   Creatinine, Ser  Date Value Ref Range Status  05/04/2024 0.89 0.44 - 1.00 mg/dL Final         Passed - Valid encounter within last 6 months    Recent Outpatient Visits           2 months ago Neuropathy   [place] [facility] [place] [facility] Dept Of [place]. [facility] Theotis Haze ORN, NP   1 year ago Primary hypertension   [place] [facility] [place] - A Dept Of [place]. [facility] Theotis Haze ORN, NP   1 year ago Generalized abdominal or pelvic swelling or mass or lump   [place] [facility] Dept Of [place]. [facility] Theotis Haze ORN, NP   2 years ago Anxiety and depression   [place] [facility] [place] [facility] A Dept Of [place]. [facility] [place], [place], [place]   2 years ago RLQ abdominal mass   [place] [facility] - [facility] Dept Of [place]. [facility] Theotis Haze ORN, NP       Future Appointments             In 2 weeks Theotis Haze ORN, NP [facility] - A Dept Of [place]olynn DEL. [facility], [address]             potassium chloride  SA (KLOR-CON  M) 20 MEQ tablet 10 tablet 0    Sig: Take 1 tablet (20 mEq total) by mouth daily.     Endocrinology:  Minerals - Potassium Supplementation Failed - 06/16/2024  3:50 PM  Failed - K in normal range and within 360 days    Potassium  Date Value Ref Range Status  05/04/2024 2.8 (L) 3.5 - 5.1 mmol/L Final         Passed - Cr in normal range and within 360 days    Creat  Date Value Ref Range Status  06/17/2016 0.61 0.50 - 1.10 mg/dL Final   Creatinine, Ser  Date Value Ref Range Status  05/04/2024 0.89 0.44 - 1.00 mg/dL Final         Passed - Valid encounter within last 12 months    Recent Outpatient Visits           2 months ago  Neuropathy   [place] [facility] [place] Dept Of [place]. [facility] Theotis Haze ORN, NP   1 year ago Primary hypertension   [place] [facility] Dept Of [place]. [facility] Theotis Haze ORN, NP   1 year ago Generalized abdominal or pelvic swelling or mass or lump   [place] [facility] - A Dept Of [place]. [facility] Theotis Haze ORN, NP   2 years ago Anxiety and depression   [place] [facility] [place] - A Dept Of [place]. [facility] [place], [place], [place]   2 years ago RLQ abdominal mass   [place] [facility] [place] - A Dept Of [place]. [facility] Theotis Haze ORN, NP       Future Appointments             In 2 weeks Theotis Haze ORN, NP [facility] - A Dept Of [place]olynn DEL. [facility], [place]

## 2024-06-17 ENCOUNTER — Other Ambulatory Visit: Payer: Self-pay

## 2024-06-22 ENCOUNTER — Other Ambulatory Visit: Payer: Self-pay

## 2024-06-27 ENCOUNTER — Telehealth: Payer: Self-pay | Admitting: Nurse Practitioner

## 2024-06-27 NOTE — Telephone Encounter (Signed)
Pt confirmed appt 9/9

## 2024-06-28 ENCOUNTER — Ambulatory Visit: Payer: Self-pay | Attending: Nurse Practitioner | Admitting: Nurse Practitioner

## 2024-06-28 ENCOUNTER — Encounter: Payer: Self-pay | Admitting: Nurse Practitioner

## 2024-06-28 ENCOUNTER — Other Ambulatory Visit: Payer: Self-pay

## 2024-06-28 VITALS — BP 190/108 | HR 68 | Temp 98.6°F | Resp 19 | Ht 61.0 in | Wt 189.8 lb

## 2024-06-28 DIAGNOSIS — F17210 Nicotine dependence, cigarettes, uncomplicated: Secondary | ICD-10-CM

## 2024-06-28 DIAGNOSIS — I5031 Acute diastolic (congestive) heart failure: Secondary | ICD-10-CM

## 2024-06-28 DIAGNOSIS — F17200 Nicotine dependence, unspecified, uncomplicated: Secondary | ICD-10-CM

## 2024-06-28 DIAGNOSIS — D72829 Elevated white blood cell count, unspecified: Secondary | ICD-10-CM

## 2024-06-28 DIAGNOSIS — F1090 Alcohol use, unspecified, uncomplicated: Secondary | ICD-10-CM

## 2024-06-28 DIAGNOSIS — I10 Essential (primary) hypertension: Secondary | ICD-10-CM

## 2024-06-28 MED ORDER — ALBUTEROL SULFATE HFA 108 (90 BASE) MCG/ACT IN AERS
2.0000 | INHALATION_SPRAY | Freq: Four times a day (QID) | RESPIRATORY_TRACT | 1 refills | Status: AC | PRN
  Filled 2024-06-28: qty 6.7, 25d supply, fill #0

## 2024-06-28 MED ORDER — FOLIC ACID 1 MG PO TABS
1.0000 mg | ORAL_TABLET | Freq: Every day | ORAL | 1 refills | Status: DC
  Filled 2024-06-28 – 2024-07-25 (×2): qty 90, 90d supply, fill #0
  Filled 2024-10-13 – 2024-10-14 (×2): qty 90, 90d supply, fill #1

## 2024-06-28 MED ORDER — FUROSEMIDE 20 MG PO TABS
20.0000 mg | ORAL_TABLET | Freq: Every day | ORAL | 1 refills | Status: DC
  Filled 2024-06-28 – 2024-07-25 (×2): qty 90, 90d supply, fill #0
  Filled 2024-10-13 – 2024-10-14 (×2): qty 90, 90d supply, fill #1

## 2024-06-28 MED ORDER — LISINOPRIL 20 MG PO TABS
20.0000 mg | ORAL_TABLET | Freq: Every day | ORAL | 1 refills | Status: DC
  Filled 2024-06-28 – 2024-07-25 (×2): qty 90, 90d supply, fill #0
  Filled 2024-10-13 – 2024-10-14 (×2): qty 90, 90d supply, fill #1

## 2024-06-28 MED ORDER — VITAMIN B-1 100 MG PO TABS
100.0000 mg | ORAL_TABLET | Freq: Every day | ORAL | 3 refills | Status: AC
  Filled 2024-06-28: qty 90, 90d supply, fill #0
  Filled 2024-07-25: qty 30, 30d supply, fill #0
  Filled 2024-10-13 – 2024-10-14 (×2): qty 30, 30d supply, fill #1

## 2024-06-28 NOTE — Progress Notes (Signed)
 Assessment & Plan:  Latoya Juarez was seen today for hospitalization follow-up.  Diagnoses and all orders for this visit:  Primary hypertension Repeat blood pressure continues elevated.  Will increase lisinopril  from 10 mg to 20 mg today. -     lisinopril  (ZESTRIL ) 20 MG tablet; Take 1 tablet (20 mg total) by mouth daily. Follow-up in 2 to 3 weeks for repeat blood presssure check/labs  Alcohol use disorder -     folic acid  (FOLVITE ) 1 MG tablet; Take 1 tablet (1 mg total) by mouth daily. -     thiamine  (VITAMIN B-1) 100 MG tablet; Take 1 tablet (100 mg total) by mouth daily.  Leukocytosis, unspecified type -     CBC with Differential  Tobacco dependence -     albuterol  (VENTOLIN  HFA) 108 (90 Base) MCG/ACT inhaler; Inhale 2 puffs into the lungs every 6 (six) hours as needed for wheezing or shortness of breath.  Acute diastolic CHF (congestive heart failure) (HCC) -     furosemide  (LASIX ) 20 MG tablet; Take 1 tablet (20 mg total) by mouth daily. -     CMP14+EGFR -     Brain natriuretic peptide -     Ambulatory referral to Cardiology Shortness of breath possibly linked to smoking and recent pneumonia. - Send prescription for inhaler. - Refer to cardiology for evaluation.  Pneumonia Follow-up to ensure resolution and normalization of WBC count. - Order CBC to check WBC count. - Ensure no residual bacteria in lung.  Tobacco use Continued smoking may exacerbate respiratory issues and contribute to shortness of breath. - Discuss smoking cessation options.  Hypokalemia and hyponatremia Sodium slightly low, potassium critically low, likely due to furosemide  use, with risk of cardiac arrhythmias. - Order blood tests to recheck potassium and sodium levels. - Continue potassium supplementation.    General Health Maintenance Due for routine mammogram screening. - Order mammogram.  Follow-up Need for follow-up with specialists and monitoring of current conditions. - Provide ENT  phone number for throat biopsy follow-up. - Ensure cardiology contacts her for evaluation. - Advise to send MyChart message if any issues arise.  Patient has been counseled on age-appropriate routine health concerns for screening and prevention. These are reviewed and up-to-date. Referrals have been placed accordingly. Immunizations are up-to-date or declined.    Subjective:   Chief Complaint  Patient presents with   Hospitalization Follow-up   History of Present Illness Latoya Juarez is a 53 year old female who presents for a follow-up visit after a recent hospitalization for pneumonia.   HFU Latoya Juarez was hospitalized on July 14 and discharged on July 16.  During that time she was treated for new onset of acute diastolic CHF with echocardiogram EF 65 to 70% with grade 1 diastolic dysfunction.  She was started on oral Lasix  and p.o. KCl prior to discharge.  She was treated with ceftriaxone  and azithromycin  bilateral community-acquired pneumonia.  Alcohol cessation teaching was also implemented as she reports drinking 2-3 vodkas per day.  She is also a smoker.  She has a history of suspicious thyroid  nodule and was referred to ENT for FNA.  However she has been lost to follow-up.  She was instructed to follow-up with ENT.  Contact information to the ENT office was given to her   Today she is requesting albuterol  inhaler and states she experiences dyspnea every morning upon waking, feeling 'real windy' and short of breath when walking to the kitchen.  She does continue to smoke cigarettes. She describes  a sensation of her heart 'closing' when she starts moving around, which she associates with her smoking habits.  HTN She is currently on lisinopril  10 mg daily.  Lisinopril  hydrochlorothiazide  combo was discontinued during her hospital admission due to hypokalemia. She is taking potassium supplements, 20 mL equivalent, due to critically low potassium levels, likely a side effect of her  furosemide  (Lasix ) use. Her sodium levels are also low.    Review of Systems  Constitutional:  Negative for fever, malaise/fatigue and weight loss.  HENT: Negative.  Negative for nosebleeds.   Eyes: Negative.  Negative for blurred vision, double vision and photophobia.  Respiratory:  Positive for shortness of breath. Negative for cough.   Cardiovascular: Negative.  Negative for chest pain, palpitations and leg swelling.  Gastrointestinal: Negative.  Negative for heartburn, nausea and vomiting.  Musculoskeletal: Negative.  Negative for myalgias.  Neurological: Negative.  Negative for dizziness, focal weakness, seizures and headaches.  Psychiatric/Behavioral: Negative.  Negative for suicidal ideas.     Past Medical History:  Diagnosis Date   Arthritis    knees   Chronic constipation    Depression    Hemorrhoids    Hyperlipidemia, mixed    Hypertension    IDA (iron deficiency anemia)    Obesity    Prediabetes    Uterine leiomyoma     Past Surgical History:  Procedure Laterality Date   CESAREAN SECTION     x5 --- first [id number] ;   last one 11/ 1999  with BILATERAL TUBAL LIGATION   COLONOSCOPY WITH PROPOFOL   11/22/2021   dr shila   ROBOT ASSISTED MYOMECTOMY N/A 10/27/2023   Procedure: XI ROBOTIC ASSISTED MYOMECTOMY LYSIS OF ADHESIONS, Fibriod Morcelation, Mini Laparotomy;  Surgeon: Jeralyn Crutch, MD;  Location: [facility];  Service: Gynecology;  Laterality: N/A;  TAP BLOCK   TUBAL LIGATION      Family History  Problem Relation Age of Onset   Diabetes Mother    Hypertension Mother    Stroke Mother    Hypertension Brother    Diabetes Brother    Stroke Brother    Heart attack Brother    Breast cancer Maternal Aunt    Colon cancer Neg Hx    Esophageal cancer Neg Hx    Rectal cancer Neg Hx    Stomach cancer Neg Hx     Social History Reviewed with no changes to be made today.   Outpatient Medications Prior to Visit  Medication Sig Dispense  Refill   acetaminophen  (TYLENOL ) 500 MG tablet Take 2 tablets (1,000 mg total) by mouth every 6 (six) hours as needed. 120 tablet 1   gabapentin  (NEURONTIN ) 100 MG capsule Take 1 capsule (100 mg total) by mouth 3 (three) times daily. 90 capsule 3   guaiFENesin  (MUCINEX ) 600 MG 12 hr tablet Take 1 tablet (600 mg total) by mouth 2 (two) times daily as needed for cough. 20 tablet 0   ibuprofen  (ADVIL ) 800 MG tablet Take 1 tablet (800 mg total) by mouth 3 (three) times daily with meals as needed for headache, moderate pain (pain score 4-6) or cramping. 90 tablet 1   metFORMIN  (GLUCOPHAGE ) 500 MG tablet Take 1 tablet (500 mg total) by mouth daily with breakfast. 90 tablet 1   Multiple Vitamin (MULTIVITAMIN WITH MINERALS) TABS tablet Take 1 tablet by mouth daily. 30 tablet 0   potassium chloride  SA (KLOR-CON  M) 20 MEQ tablet Take 1 tablet (20 mEq total) by mouth daily. 10 tablet 0   pravastatin  (  PRAVACHOL ) 40 MG tablet Take 1 tablet (40 mg total) by mouth daily. FOR CHOLESTEROL 90 tablet 1   azithromycin  (ZITHROMAX ) 500 MG tablet Take 1 tablet (500 mg total) by mouth daily. 3 tablet 0   folic acid  (FOLVITE ) 1 MG tablet Take 1 tablet (1 mg total) by mouth daily. 30 tablet 0   furosemide  (LASIX ) 20 MG tablet Take 1 tablet (20 mg total) by mouth daily. 30 tablet 0   lisinopril  (ZESTRIL ) 10 MG tablet Take 1 tablet (10 mg total) by mouth daily. 30 tablet 0   thiamine  (VITAMIN B-1) 100 MG tablet Take 1 tablet (100 mg total) by mouth daily. 30 tablet 0   No facility-administered medications prior to visit.    Allergies  Allergen Reactions   Vistaril  [Hydroxyzine ] Other (See Comments)    Nightmares       Objective:    BP (!) 190/108 (BP Location: Left Arm, Patient Position: Sitting, Cuff Size: Normal)   Pulse 68   Temp 98.6 F (37 C) (Oral)   Resp 19   Ht 5' 1 (1.549 m)   Wt 189 lb 12.8 oz (86.1 kg)   SpO2 100%   BMI 35.86 kg/m  Wt Readings from Last 3 Encounters:  06/28/24 189 lb 12.8 oz  (86.1 kg)  05/02/24 178 lb (80.7 kg)  03/29/24 181 lb 9.6 oz (82.4 kg)    Physical Exam Vitals and nursing note reviewed.  Constitutional:      Appearance: She is well-developed.  HENT:     Head: Normocephalic and atraumatic.  Neck:     Thyroid : Thyroid  mass present.  Cardiovascular:     Rate and Rhythm: Normal rate and regular rhythm.     Heart sounds: Normal heart sounds. No murmur heard.    No friction rub. No gallop.  Pulmonary:     Effort: Pulmonary effort is normal. No tachypnea or respiratory distress.     Breath sounds: Normal breath sounds. No decreased breath sounds, wheezing, rhonchi or rales.  Chest:     Chest wall: No tenderness.  Abdominal:     General: Bowel sounds are normal.     Palpations: Abdomen is soft.  Musculoskeletal:        General: Normal range of motion.     Cervical back: Normal range of motion.  Skin:    General: Skin is warm and dry.  Neurological:     Mental Status: She is alert and oriented to person, place, and time.     Coordination: Coordination normal.  Psychiatric:        Behavior: Behavior normal. Behavior is cooperative.        Thought Content: Thought content normal.        Judgment: Judgment normal.          Patient has been counseled extensively about nutrition and exercise as well as the importance of adherence with medications and regular follow-up. The patient was given clear instructions to go to ER or return to medical center if symptoms don't improve, worsen or new problems develop. The patient verbalized understanding.   Follow-up: Return in about 3 months (around 09/27/2024).   Haze LELON Servant, FNP-BC [facility] [place], [place] [phone]   06/28/2024, 1:19 PM

## 2024-06-28 NOTE — Patient Instructions (Signed)
 [facility]  ph. # [id number]  address [address]

## 2024-06-29 LAB — CMP14+EGFR
ALT: 19 IU/L (ref 0–32)
AST: 33 IU/L (ref 0–40)
Albumin: 4.2 g/dL (ref 3.8–4.9)
Alkaline Phosphatase: 101 IU/L (ref 44–121)
BUN/Creatinine Ratio: 5 — ABNORMAL LOW (ref 9–23)
BUN: 5 mg/dL — ABNORMAL LOW (ref 6–24)
Bilirubin Total: 1.6 mg/dL — ABNORMAL HIGH (ref 0.0–1.2)
CO2: 22 mmol/L (ref 20–29)
Calcium: 9.8 mg/dL (ref 8.7–10.2)
Chloride: 98 mmol/L (ref 96–106)
Creatinine, Ser: 0.95 mg/dL (ref 0.57–1.00)
Globulin, Total: 3.3 g/dL (ref 1.5–4.5)
Glucose: 103 mg/dL — ABNORMAL HIGH (ref 70–99)
Potassium: 3.6 mmol/L (ref 3.5–5.2)
Sodium: 138 mmol/L (ref 134–144)
Total Protein: 7.5 g/dL (ref 6.0–8.5)
eGFR: 72 mL/min/1.73 (ref >59)

## 2024-06-29 LAB — CBC WITH DIFFERENTIAL/PLATELET
Basophils Absolute: 0 x10E3/uL (ref 0.0–0.2)
Basos: 1 %
EOS (ABSOLUTE): 0.2 x10E3/uL (ref 0.0–0.4)
Eos: 3 %
Hematocrit: 32.9 % — ABNORMAL LOW (ref 34.0–46.6)
Hemoglobin: 10.1 g/dL — ABNORMAL LOW (ref 11.1–15.9)
Immature Grans (Abs): 0 x10E3/uL (ref 0.0–0.1)
Immature Granulocytes: 0 %
Lymphocytes Absolute: 1.2 x10E3/uL (ref 0.7–3.1)
Lymphs: 17 %
MCH: 26.6 pg (ref 26.6–33.0)
MCHC: 30.7 g/dL — ABNORMAL LOW (ref 31.5–35.7)
MCV: 87 fL (ref 79–97)
Monocytes Absolute: 0.5 x10E3/uL (ref 0.1–0.9)
Monocytes: 8 %
Neutrophils Absolute: 4.9 x10E3/uL (ref 1.4–7.0)
Neutrophils: 71 %
Platelets: 372 x10E3/uL (ref 150–450)
RBC: 3.8 x10E6/uL (ref 3.77–5.28)
RDW: 15.1 % (ref 11.7–15.4)
WBC: 6.8 x10E3/uL (ref 3.4–10.8)

## 2024-06-29 LAB — BRAIN NATRIURETIC PEPTIDE: BNP: 70.8 pg/mL (ref 0.0–100.0)

## 2024-07-01 ENCOUNTER — Ambulatory Visit: Admitting: Nurse Practitioner

## 2024-07-06 ENCOUNTER — Other Ambulatory Visit: Payer: Self-pay

## 2024-07-06 ENCOUNTER — Ambulatory Visit: Payer: Self-pay | Admitting: Nurse Practitioner

## 2024-07-06 DIAGNOSIS — D508 Other iron deficiency anemias: Secondary | ICD-10-CM

## 2024-07-06 MED ORDER — IRON (FERROUS SULFATE) 325 (65 FE) MG PO TABS
325.0000 mg | ORAL_TABLET | Freq: Every day | ORAL | 3 refills | Status: DC
  Filled 2024-07-06 – 2024-07-25 (×2): qty 30, 30d supply, fill #0
  Filled 2024-10-13: qty 90, 90d supply, fill #1

## 2024-07-07 ENCOUNTER — Other Ambulatory Visit: Payer: Self-pay

## 2024-07-08 ENCOUNTER — Telehealth: Payer: Self-pay | Admitting: Diagnostic Neuroimaging

## 2024-07-08 ENCOUNTER — Ambulatory Visit: Payer: Self-pay | Admitting: Diagnostic Neuroimaging

## 2024-07-08 NOTE — Telephone Encounter (Signed)
 She was scheduled today and was late. I rescheduled her for Dr. Chancy next available which is February and added her to his wait list. She was very kind with no arguments. I told her I would send a message to see if she can get in earlier with any other providers. I also let her know her BCBS plan is inactive and she will call to check. Thanks!

## 2024-07-15 ENCOUNTER — Other Ambulatory Visit: Payer: Self-pay

## 2024-07-25 ENCOUNTER — Other Ambulatory Visit: Payer: Self-pay

## 2024-07-25 ENCOUNTER — Other Ambulatory Visit: Payer: Self-pay | Admitting: Nurse Practitioner

## 2024-07-26 ENCOUNTER — Other Ambulatory Visit: Payer: Self-pay

## 2024-07-29 ENCOUNTER — Other Ambulatory Visit: Payer: Self-pay

## 2024-09-27 ENCOUNTER — Ambulatory Visit: Payer: Self-pay | Admitting: Nurse Practitioner

## 2024-10-13 ENCOUNTER — Other Ambulatory Visit: Payer: Self-pay | Admitting: Nurse Practitioner

## 2024-10-13 DIAGNOSIS — D508 Other iron deficiency anemias: Secondary | ICD-10-CM

## 2024-10-13 DIAGNOSIS — E876 Hypokalemia: Secondary | ICD-10-CM

## 2024-10-14 ENCOUNTER — Other Ambulatory Visit: Payer: Self-pay

## 2024-10-14 MED ORDER — POTASSIUM CHLORIDE CRYS ER 20 MEQ PO TBCR
20.0000 meq | EXTENDED_RELEASE_TABLET | Freq: Every day | ORAL | 0 refills | Status: DC
  Filled 2024-10-14: qty 10, 10d supply, fill #0

## 2024-10-14 MED ORDER — ADULT MULTIVITAMIN W/MINERALS CH
1.0000 | ORAL_TABLET | Freq: Every day | ORAL | 0 refills | Status: AC
  Filled 2024-10-14: qty 30, 30d supply, fill #0

## 2024-10-17 ENCOUNTER — Other Ambulatory Visit: Payer: Self-pay

## 2024-10-19 ENCOUNTER — Other Ambulatory Visit: Payer: Self-pay

## 2024-10-20 ENCOUNTER — Ambulatory Visit (HOSPITAL_COMMUNITY)
Admission: EM | Admit: 2024-10-20 | Discharge: 2024-10-20 | Discharge status: Against Medical Advice | Attending: Psychiatry | Admitting: Psychiatry

## 2024-10-20 DIAGNOSIS — F1290 Cannabis use, unspecified, uncomplicated: Secondary | ICD-10-CM | POA: Insufficient documentation

## 2024-10-20 DIAGNOSIS — F1010 Alcohol abuse, uncomplicated: Secondary | ICD-10-CM | POA: Diagnosis not present

## 2024-10-20 DIAGNOSIS — F32A Depression, unspecified: Secondary | ICD-10-CM | POA: Insufficient documentation

## 2024-10-20 DIAGNOSIS — Z560 Unemployment, unspecified: Secondary | ICD-10-CM | POA: Insufficient documentation

## 2024-10-20 DIAGNOSIS — F419 Anxiety disorder, unspecified: Secondary | ICD-10-CM | POA: Insufficient documentation

## 2024-10-20 MED ORDER — MAGNESIUM HYDROXIDE 400 MG/5ML PO SUSP: 30.0000 mL | Freq: Every day | ORAL | Status: DC | PRN

## 2024-10-20 MED ORDER — ALUM & MAG HYDROXIDE-SIMETH 200-200-20 MG/5ML PO SUSP: 30.0000 mL | ORAL | Status: DC | PRN

## 2024-10-20 MED ORDER — HALOPERIDOL LACTATE 5 MG/ML IJ SOLN: 10.0000 mg | Freq: Three times a day (TID) | INTRAMUSCULAR | Status: DC | PRN

## 2024-10-20 MED ORDER — DIPHENHYDRAMINE HCL 50 MG PO CAPS: 50.0000 mg | ORAL_CAPSULE | Freq: Three times a day (TID) | ORAL | Status: DC | PRN

## 2024-10-20 MED ORDER — HALOPERIDOL 5 MG PO TABS: 5.0000 mg | ORAL_TABLET | Freq: Three times a day (TID) | ORAL | Status: DC | PRN

## 2024-10-20 MED ORDER — DIPHENHYDRAMINE HCL 50 MG/ML IJ SOLN: 50.0000 mg | Freq: Three times a day (TID) | INTRAMUSCULAR | Status: DC | PRN

## 2024-10-20 MED ORDER — LORAZEPAM 2 MG/ML IJ SOLN: 2.0000 mg | Freq: Three times a day (TID) | INTRAMUSCULAR | Status: DC | PRN

## 2024-10-20 MED ORDER — HALOPERIDOL LACTATE 5 MG/ML IJ SOLN: 5.0000 mg | Freq: Three times a day (TID) | INTRAMUSCULAR | Status: DC | PRN

## 2024-10-20 MED ORDER — ACETAMINOPHEN 325 MG PO TABS: 650.0000 mg | ORAL_TABLET | Freq: Four times a day (QID) | ORAL | Status: DC | PRN
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
## 2024-10-20 NOTE — ED Notes (Deleted)
.  kks
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
## 2024-11-07 ENCOUNTER — Telehealth: Payer: Self-pay | Admitting: Nurse Practitioner

## 2024-11-07 NOTE — Telephone Encounter (Signed)
 Contacted pt left vm to confirmed appt

## 2024-11-08 ENCOUNTER — Other Ambulatory Visit: Payer: Self-pay

## 2024-11-08 ENCOUNTER — Ambulatory Visit: Payer: Self-pay | Attending: Nurse Practitioner | Admitting: Nurse Practitioner

## 2024-11-08 ENCOUNTER — Other Ambulatory Visit (HOSPITAL_COMMUNITY)
Admission: RE | Admit: 2024-11-08 | Discharge: 2024-11-08 | Disposition: A | Discharge status: Home / Self Care | Payer: Self-pay | Source: Ambulatory Visit | Attending: Nurse Practitioner | Admitting: Nurse Practitioner

## 2024-11-08 ENCOUNTER — Encounter: Payer: Self-pay | Admitting: Nurse Practitioner

## 2024-11-08 VITALS — BP 114/76 | HR 75 | Ht 61.0 in | Wt 195.4 lb

## 2024-11-08 DIAGNOSIS — N898 Other specified noninflammatory disorders of vagina: Secondary | ICD-10-CM

## 2024-11-08 DIAGNOSIS — E876 Hypokalemia: Secondary | ICD-10-CM

## 2024-11-08 DIAGNOSIS — F1090 Alcohol use, unspecified, uncomplicated: Secondary | ICD-10-CM

## 2024-11-08 DIAGNOSIS — I10 Essential (primary) hypertension: Secondary | ICD-10-CM

## 2024-11-08 DIAGNOSIS — G629 Polyneuropathy, unspecified: Secondary | ICD-10-CM

## 2024-11-08 DIAGNOSIS — R7303 Prediabetes: Secondary | ICD-10-CM

## 2024-11-08 DIAGNOSIS — E041 Nontoxic single thyroid nodule: Secondary | ICD-10-CM

## 2024-11-08 DIAGNOSIS — I5022 Chronic systolic (congestive) heart failure: Secondary | ICD-10-CM

## 2024-11-08 DIAGNOSIS — D508 Other iron deficiency anemias: Secondary | ICD-10-CM

## 2024-11-08 MED ORDER — FOLIC ACID 1 MG PO TABS
1.0000 mg | ORAL_TABLET | Freq: Every day | ORAL | 1 refills | Status: AC
  Filled 2024-11-08: qty 90, 90d supply, fill #0

## 2024-11-08 MED ORDER — FUROSEMIDE 20 MG PO TABS
20.0000 mg | ORAL_TABLET | Freq: Every day | ORAL | 1 refills | Status: AC
  Filled 2024-11-08: qty 90, 90d supply, fill #0

## 2024-11-08 MED ORDER — METFORMIN HCL 500 MG PO TABS
500.0000 mg | ORAL_TABLET | Freq: Every day | ORAL | 1 refills | Status: AC
  Filled 2024-11-08: qty 90, 90d supply, fill #0

## 2024-11-08 MED ORDER — LISINOPRIL 20 MG PO TABS
20.0000 mg | ORAL_TABLET | Freq: Every day | ORAL | 1 refills | Status: AC
  Filled 2024-11-08: qty 90, 90d supply, fill #0

## 2024-11-08 MED ORDER — IRON (FERROUS SULFATE) 325 (65 FE) MG PO TABS
325.0000 mg | ORAL_TABLET | Freq: Every day | ORAL | 3 refills | Status: AC
  Filled 2024-11-08: qty 30, 30d supply, fill #0

## 2024-11-08 MED ORDER — POTASSIUM CHLORIDE CRYS ER 20 MEQ PO TBCR
20.0000 meq | EXTENDED_RELEASE_TABLET | Freq: Every day | ORAL | 6 refills | Status: AC
  Filled 2024-11-08: qty 30, 30d supply, fill #0

## 2024-11-08 MED ORDER — GABAPENTIN 100 MG PO CAPS
100.0000 mg | ORAL_CAPSULE | Freq: Three times a day (TID) | ORAL | 3 refills | Status: AC
  Filled 2024-11-08: qty 90, 30d supply, fill #0

## 2024-11-08 NOTE — Patient Instructions (Addendum)
Floradix liquid iron.

## 2024-11-08 NOTE — Progress Notes (Signed)
 "  Latoya Juarez was seen today for medical management of chronic issues.  Diagnoses and all orders for this visit:  Primary hypertension -     CMP14+EGFR -     folic acid  (FOLVITE ) 1 MG tablet; Take 1 tablet (1 mg total) by mouth daily. Continue all antihypertensives as prescribed.  Reminded to bring in blood pressure log for follow  up appointment.  RECOMMENDATIONS: DASH/Mediterranean Diets are healthier choices for HTN.    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  Drug-induced hypokalemia -     potassium chloride  SA (KLOR-CON  M) 20 MEQ tablet; Take 1 tablet (20 mEq total) by mouth daily.  Prediabetes -     CMP14+EGFR -     Hemoglobin A1c -     metFORMIN  (GLUCOPHAGE ) 500 MG tablet; Take 1 tablet (500 mg total) by mouth daily with breakfast.  Chronic systolic heart failure (HCC) -     lisinopril  (ZESTRIL ) 20 MG tablet; Take 1 tablet (20 mg total) by mouth daily. -     furosemide  (LASIX ) 20 MG tablet; Take 1 tablet (20 mg total) by mouth daily.  Neuropathy -     gabapentin  (NEURONTIN ) 100 MG capsule; Take 1 capsule (100 mg total) by mouth 3 (three) times daily.  Alcohol use disorder -     folic acid  (FOLVITE ) 1 MG tablet; Take 1 tablet (1 mg total) by mouth daily. Declines naltrexone  Declines wellbutrin for smoking cessation     Patient has been counseled on age-appropriate routine health concerns for screening and prevention. These are reviewed and up-to-date. Referrals have been placed accordingly. Immunizations are up-to-date or declined.    Subjective:   Chief Complaint  Patient presents with   Medical Management of Chronic Issues    Latoya Juarez 54 y.o. female presents to office today for follow-up to hypertension  She has a past medical  history of Arthritis, Chronic constipation, Depression, Hemorrhoids, Hyperlipidemia, mixed, Hypertension, IDA, Obesity, Prediabetes, and Uterine leiomyoma. She has a history of suspicious thyroid  nodule and was referred to ENT for FNA.  However she has been lost to follow-up.  She has been instructed to follow-up with ENT.  Contact information to the ENT office was given to her via mychart  She stopped drinking alcohol on January 4 and she has also stopped smoking marijuana.  She is working on quitting smoking cigarettes   HTN Blood pressure at goal with lisinopril  20 mg daily. BP Readings from Last 3 Encounters:  11/08/24 114/76  10/20/24 111/78  06/28/24 (!) 190/108    GU Requesting vaginal swab today for malodorous vaginal discharge.  Over the past 2 months she has had abnormal uterine bleeding with prolonged menstrual cycles.  She does have a history of large uterine fibroids status post myomectomy January 2025.  Prediabetes A1c at goal with metformin  500 mg daily Lab Results  Component Value Date   HGBA1C 5.5 03/29/2024    Takes furosemide  for swelling and history of diastolic heart failure. Takes potassium for hyperkalemia 2/2 furosemide  administration  Review of Systems  Constitutional:  Negative for fever, malaise/fatigue and weight loss.  HENT: Negative.  Negative for nosebleeds.   Eyes: Negative.  Negative for blurred vision,  double vision and photophobia.  Respiratory: Negative.  Negative for cough and shortness of breath.   Cardiovascular: Negative.  Negative for chest pain, palpitations and leg swelling.  Gastrointestinal: Negative.  Negative for heartburn, nausea and vomiting.  Genitourinary:        See hpi  Musculoskeletal: Negative.  Negative for myalgias.  Neurological: Negative.  Negative for dizziness, focal weakness, seizures and headaches.  Psychiatric/Behavioral: Negative.  Negative for suicidal ideas.     Past Medical History:  Diagnosis Date   Arthritis     knees   Chronic constipation    Depression    Hemorrhoids    Hyperlipidemia, mixed    Hypertension    IDA (iron  deficiency anemia)    Obesity    Prediabetes    Uterine leiomyoma     Past Surgical History:  Procedure Laterality Date   CESAREAN SECTION     x5 --- first [id number] ;   last one 11/ 1999  with BILATERAL TUBAL LIGATION   COLONOSCOPY WITH PROPOFOL   11/22/2021   dr shila   ROBOT ASSISTED MYOMECTOMY N/A 10/27/2023   Procedure: XI ROBOTIC ASSISTED MYOMECTOMY LYSIS OF ADHESIONS, Fibriod Morcelation, Mini Laparotomy;  Surgeon: Jeralyn Crutch, MD;  Location: [facility];  Service: Gynecology;  Laterality: N/A;  TAP BLOCK   TUBAL LIGATION      Family History  Problem Relation Age of Onset   Diabetes Mother    Hypertension Mother    Stroke Mother    Hypertension Brother    Diabetes Brother    Stroke Brother    Heart attack Brother    Breast cancer Maternal Aunt    Colon cancer Neg Hx    Esophageal cancer Neg Hx    Rectal cancer Neg Hx    Stomach cancer Neg Hx     Social History Reviewed with no changes to be made today.   Outpatient Medications Prior to Visit  Medication Sig Dispense Refill   acetaminophen  (TYLENOL ) 500 MG tablet Take 2 tablets (1,000 mg total) by mouth every 6 (six) hours as needed. 120 tablet 1   albuterol  (VENTOLIN  HFA) 108 (90 Base) MCG/ACT inhaler Inhale 2 puffs into the lungs every 6 (six) hours as needed for wheezing or shortness of breath. 6.7 g 1   ibuprofen  (ADVIL ) 800 MG tablet Take 1 tablet (800 mg total) by mouth 3 (three) times daily with meals as needed for headache, moderate pain (pain score 4-6) or cramping. 90 tablet 1   Multiple Vitamin (MULTIVITAMIN WITH MINERALS) TABS tablet Take 1 tablet by mouth daily. 30 tablet 0   pravastatin  (PRAVACHOL ) 40 MG tablet Take 1 tablet (40 mg total) by mouth daily. FOR CHOLESTEROL 90 tablet 1   thiamine  (VITAMIN B-1) 100 MG tablet Take 1 tablet (100 mg total) by mouth daily.  90 tablet 3   folic acid  (FOLVITE ) 1 MG tablet Take 1 tablet (1 mg total) by mouth daily. 90 tablet 1   furosemide  (LASIX ) 20 MG tablet Take 1 tablet (20 mg total) by mouth daily. 90 tablet 1   gabapentin  (NEURONTIN ) 100 MG capsule Take 1 capsule (100 mg total) by mouth 3 (three) times daily. 90 capsule 3   guaiFENesin  (MUCINEX ) 600 MG 12 hr tablet Take 1 tablet (600 mg total) by mouth 2 (two) times daily as needed for cough. 20 tablet 0   lisinopril  (ZESTRIL ) 20 MG tablet Take 1 tablet (20 mg total) by mouth daily. 90 tablet 1   metFORMIN  (GLUCOPHAGE ) 500 MG tablet  Take 1 tablet (500 mg total) by mouth daily with breakfast. 90 tablet 1   Iron , Ferrous Sulfate , 325 (65 Fe) MG TABS Take 1 tablet by mouth daily. (Patient not taking: Reported on 11/08/2024) 30 tablet 3   potassium chloride  SA (KLOR-CON  M) 20 MEQ tablet Take 1 tablet (20 mEq total) by mouth daily. (Patient not taking: Reported on 11/08/2024) 10 tablet 0   No facility-administered medications prior to visit.    Allergies[1]     Objective:    BP 114/76 (BP Location: Left Arm, Patient Position: Sitting, Cuff Size: Normal)   Pulse 75   Ht 5' 1 (1.549 m)   Wt 195 lb 6.4 oz (88.6 kg)   LMP 10/25/2024 (Approximate)   SpO2 98%   BMI 36.92 kg/m  Wt Readings from Last 3 Encounters:  11/08/24 195 lb 6.4 oz (88.6 kg)  06/28/24 189 lb 12.8 oz (86.1 kg)  05/02/24 178 lb (80.7 kg)    Physical Exam Vitals and nursing note reviewed.  Constitutional:      Appearance: She is well-developed.  HENT:     Head: Normocephalic and atraumatic.  Cardiovascular:     Rate and Rhythm: Normal rate and regular rhythm.     Heart sounds: Normal heart sounds. No murmur heard.    No friction rub. No gallop.  Pulmonary:     Effort: Pulmonary effort is normal. No tachypnea or respiratory distress.     Breath sounds: Normal breath sounds. No decreased breath sounds, wheezing, rhonchi or rales.  Chest:     Chest wall: No tenderness.  Abdominal:      Palpations: Abdomen is soft.  Musculoskeletal:        General: Normal range of motion.     Cervical back: Normal range of motion.  Skin:    General: Skin is warm and dry.  Neurological:     Mental Status: She is alert and oriented to person, place, and time.     Coordination: Coordination normal.  Psychiatric:        Behavior: Behavior normal. Behavior is cooperative.        Thought Content: Thought content normal.        Judgment: Judgment normal.          Patient has been counseled extensively about nutrition and exercise as well as the importance of adherence with medications and regular follow-up. The patient was given clear instructions to go to ER or return to medical center if symptoms don't improve, worsen or new problems develop. The patient verbalized understanding.   Follow-up: Return in about 3 months (around 02/06/2025).   Haze LELON Servant, FNP-BC [facility] [place], [place] [phone]   11/08/2024, 1:42 PM     [1]  Allergies Allergen Reactions   Vistaril  [Hydroxyzine ] Other (See Comments)    Nightmares   "

## 2024-11-09 ENCOUNTER — Ambulatory Visit: Payer: Self-pay | Admitting: Nurse Practitioner

## 2024-11-09 DIAGNOSIS — B9689 Other specified bacterial agents as the cause of diseases classified elsewhere: Secondary | ICD-10-CM

## 2024-11-09 LAB — CBC WITH DIFFERENTIAL/PLATELET
Basophils Absolute: 0.1 x10E3/uL (ref 0.0–0.2)
Basos: 1 %
EOS (ABSOLUTE): 0.2 x10E3/uL (ref 0.0–0.4)
Eos: 2 %
Hematocrit: 43.9 % (ref 34.0–46.6)
Hemoglobin: 14 g/dL (ref 11.1–15.9)
Immature Grans (Abs): 0 x10E3/uL (ref 0.0–0.1)
Immature Granulocytes: 0 %
Lymphocytes Absolute: 1.4 x10E3/uL (ref 0.7–3.1)
Lymphs: 16 %
MCH: 28.8 pg (ref 26.6–33.0)
MCHC: 31.9 g/dL (ref 31.5–35.7)
MCV: 90 fL (ref 79–97)
Monocytes Absolute: 0.4 x10E3/uL (ref 0.1–0.9)
Monocytes: 5 %
Neutrophils Absolute: 6.6 x10E3/uL (ref 1.4–7.0)
Neutrophils: 76 %
Platelets: 487 x10E3/uL — ABNORMAL HIGH (ref 150–450)
RBC: 4.86 x10E6/uL (ref 3.77–5.28)
RDW: 15.2 % (ref 11.7–15.4)
WBC: 8.6 x10E3/uL (ref 3.4–10.8)

## 2024-11-09 LAB — CERVICOVAGINAL ANCILLARY ONLY
Bacterial Vaginitis (gardnerella): POSITIVE — AB
Candida Glabrata: NEGATIVE
Candida Vaginitis: NEGATIVE
Chlamydia: NEGATIVE
Comment: NEGATIVE
Comment: NEGATIVE
Comment: NEGATIVE
Comment: NEGATIVE
Comment: NEGATIVE
Comment: NORMAL
Neisseria Gonorrhea: NEGATIVE
Trichomonas: NEGATIVE

## 2024-11-09 LAB — CMP14+EGFR
ALT: 17 IU/L (ref 0–32)
AST: 22 IU/L (ref 0–40)
Albumin: 4.1 g/dL (ref 3.8–4.9)
Alkaline Phosphatase: 96 IU/L (ref 49–135)
BUN/Creatinine Ratio: 15 (ref 9–23)
BUN: 16 mg/dL (ref 6–24)
Bilirubin Total: 0.4 mg/dL (ref 0.0–1.2)
CO2: 22 mmol/L (ref 20–29)
Calcium: 10.8 mg/dL — ABNORMAL HIGH (ref 8.7–10.2)
Chloride: 101 mmol/L (ref 96–106)
Creatinine, Ser: 1.08 mg/dL — ABNORMAL HIGH (ref 0.57–1.00)
Globulin, Total: 3.5 g/dL (ref 1.5–4.5)
Glucose: 91 mg/dL (ref 70–99)
Potassium: 4.5 mmol/L (ref 3.5–5.2)
Sodium: 138 mmol/L (ref 134–144)
Total Protein: 7.6 g/dL (ref 6.0–8.5)
eGFR: 62 mL/min/1.73

## 2024-11-09 LAB — HEMOGLOBIN A1C
Est. average glucose Bld gHb Est-mCnc: 123 mg/dL
Hgb A1c MFr Bld: 5.9 % — ABNORMAL HIGH (ref 4.8–5.6)

## 2024-11-09 MED ORDER — METRONIDAZOLE 500 MG PO TABS
500.0000 mg | ORAL_TABLET | Freq: Two times a day (BID) | ORAL | 0 refills | Status: AC
  Filled 2024-11-09: qty 14, 7d supply, fill #0

## 2024-11-10 ENCOUNTER — Other Ambulatory Visit: Payer: Self-pay

## 2024-11-18 ENCOUNTER — Other Ambulatory Visit: Payer: Self-pay

## 2024-11-21 ENCOUNTER — Other Ambulatory Visit: Payer: Self-pay

## 2024-11-23 ENCOUNTER — Other Ambulatory Visit: Payer: Self-pay

## 2024-11-24 ENCOUNTER — Encounter (INDEPENDENT_AMBULATORY_CARE_PROVIDER_SITE_OTHER): Payer: Self-pay

## 2024-11-30 ENCOUNTER — Ambulatory Visit: Admitting: Diagnostic Neuroimaging

## 2025-02-06 ENCOUNTER — Ambulatory Visit: Payer: Self-pay | Admitting: Nurse Practitioner
